# Patient Record
Sex: Female | Born: 1945 | Race: White | Hispanic: No | Marital: Married | Smoking: Former smoker
Health system: Southern US, Community
[De-identification: ages and names within clinical notes are randomized; demographics above are authoritative.]

## PROBLEM LIST (undated history)

## (undated) DIAGNOSIS — M1990 Unspecified osteoarthritis, unspecified site: Secondary | ICD-10-CM

## (undated) DIAGNOSIS — K529 Noninfective gastroenteritis and colitis, unspecified: Secondary | ICD-10-CM

## (undated) DIAGNOSIS — J310 Chronic rhinitis: Secondary | ICD-10-CM

## (undated) DIAGNOSIS — K219 Gastro-esophageal reflux disease without esophagitis: Secondary | ICD-10-CM

## (undated) DIAGNOSIS — J189 Pneumonia, unspecified organism: Secondary | ICD-10-CM

## (undated) DIAGNOSIS — I10 Essential (primary) hypertension: Secondary | ICD-10-CM

## (undated) DIAGNOSIS — K589 Irritable bowel syndrome without diarrhea: Secondary | ICD-10-CM

## (undated) DIAGNOSIS — R002 Palpitations: Secondary | ICD-10-CM

## (undated) HISTORY — DX: Unspecified osteoarthritis, unspecified site: M19.90

## (undated) HISTORY — DX: Essential (primary) hypertension: I10

## (undated) HISTORY — PX: COLONOSCOPY: SHX174

## (undated) HISTORY — PX: DILATION AND CURETTAGE OF UTERUS: SHX78

## (undated) HISTORY — DX: Palpitations: R00.2

## (undated) HISTORY — PX: TONSILLECTOMY: SUR1361

## (undated) HISTORY — DX: Gastro-esophageal reflux disease without esophagitis: K21.9

## (undated) HISTORY — PX: BUNIONECTOMY: SHX129

## (undated) HISTORY — DX: Irritable bowel syndrome, unspecified: K58.9

## (undated) HISTORY — DX: Chronic rhinitis: J31.0

## (undated) HISTORY — PX: KNEE SURGERY: SHX244

## (undated) HISTORY — PX: TUBAL LIGATION: SHX77

---

## 2000-02-22 ENCOUNTER — Other Ambulatory Visit: Payer: Self-pay | Admitting: Internal Medicine

## 2001-04-22 DIAGNOSIS — K529 Noninfective gastroenteritis and colitis, unspecified: Secondary | ICD-10-CM

## 2001-04-22 HISTORY — DX: Noninfective gastroenteritis and colitis, unspecified: K52.9

## 2001-12-10 ENCOUNTER — Other Ambulatory Visit: Admission: RE | Admit: 2001-12-10 | Discharge: 2001-12-10 | Payer: Self-pay | Admitting: Family Medicine

## 2001-12-14 ENCOUNTER — Encounter: Admission: RE | Admit: 2001-12-14 | Discharge: 2001-12-14 | Payer: Self-pay | Admitting: Family Medicine

## 2001-12-14 ENCOUNTER — Encounter: Payer: Self-pay | Admitting: Family Medicine

## 2002-02-03 ENCOUNTER — Emergency Department (HOSPITAL_COMMUNITY): Admission: EM | Admit: 2002-02-03 | Discharge: 2002-02-03 | Payer: Self-pay | Admitting: Emergency Medicine

## 2002-02-11 ENCOUNTER — Encounter: Payer: Self-pay | Admitting: Family Medicine

## 2002-02-11 ENCOUNTER — Encounter: Admission: RE | Admit: 2002-02-11 | Discharge: 2002-02-11 | Payer: Self-pay | Admitting: Family Medicine

## 2002-02-22 ENCOUNTER — Ambulatory Visit (HOSPITAL_COMMUNITY): Admission: RE | Admit: 2002-02-22 | Discharge: 2002-02-22 | Payer: Self-pay | Admitting: Internal Medicine

## 2002-02-22 ENCOUNTER — Encounter (INDEPENDENT_AMBULATORY_CARE_PROVIDER_SITE_OTHER): Payer: Self-pay | Admitting: *Deleted

## 2002-04-23 ENCOUNTER — Encounter: Payer: Self-pay | Admitting: Internal Medicine

## 2002-04-23 ENCOUNTER — Encounter: Admission: RE | Admit: 2002-04-23 | Discharge: 2002-04-23 | Payer: Self-pay | Admitting: Internal Medicine

## 2003-11-29 ENCOUNTER — Encounter: Admission: RE | Admit: 2003-11-29 | Discharge: 2003-11-29 | Payer: Self-pay | Admitting: Family Medicine

## 2003-12-13 ENCOUNTER — Other Ambulatory Visit: Admission: RE | Admit: 2003-12-13 | Discharge: 2003-12-13 | Payer: Self-pay | Admitting: Obstetrics and Gynecology

## 2004-06-29 ENCOUNTER — Ambulatory Visit: Payer: Self-pay | Admitting: Internal Medicine

## 2004-07-03 ENCOUNTER — Ambulatory Visit: Payer: Self-pay | Admitting: Internal Medicine

## 2004-07-05 ENCOUNTER — Encounter: Admission: RE | Admit: 2004-07-05 | Discharge: 2004-07-05 | Payer: Self-pay | Admitting: Internal Medicine

## 2004-07-13 ENCOUNTER — Ambulatory Visit: Payer: Self-pay | Admitting: Internal Medicine

## 2005-10-21 ENCOUNTER — Encounter: Admission: RE | Admit: 2005-10-21 | Discharge: 2005-10-21 | Payer: Self-pay | Admitting: Internal Medicine

## 2005-12-17 ENCOUNTER — Encounter: Admission: RE | Admit: 2005-12-17 | Discharge: 2005-12-17 | Payer: Self-pay | Admitting: Internal Medicine

## 2007-01-14 ENCOUNTER — Encounter: Admission: RE | Admit: 2007-01-14 | Discharge: 2007-01-14 | Payer: Self-pay | Admitting: Family Medicine

## 2009-05-18 ENCOUNTER — Emergency Department (HOSPITAL_COMMUNITY): Admission: EM | Admit: 2009-05-18 | Discharge: 2009-05-18 | Payer: Self-pay | Admitting: Emergency Medicine

## 2011-03-12 ENCOUNTER — Emergency Department (HOSPITAL_COMMUNITY): Payer: Medicare Other

## 2011-03-12 ENCOUNTER — Emergency Department (HOSPITAL_COMMUNITY)
Admission: EM | Admit: 2011-03-12 | Discharge: 2011-03-12 | Disposition: A | Discharge status: Home / Self Care | Payer: Medicare Other | Attending: Emergency Medicine | Admitting: Emergency Medicine

## 2011-03-12 ENCOUNTER — Encounter: Payer: Self-pay | Admitting: Emergency Medicine

## 2011-03-12 DIAGNOSIS — F17200 Nicotine dependence, unspecified, uncomplicated: Secondary | ICD-10-CM | POA: Insufficient documentation

## 2011-03-12 DIAGNOSIS — I491 Atrial premature depolarization: Secondary | ICD-10-CM | POA: Insufficient documentation

## 2011-03-12 DIAGNOSIS — R002 Palpitations: Secondary | ICD-10-CM

## 2011-03-12 LAB — DIFFERENTIAL
Basophils Absolute: 0.1 10*3/uL (ref 0.0–0.1)
Basophils Relative: 1 % (ref 0–1)
Eosinophils Absolute: 0.1 10*3/uL (ref 0.0–0.7)
Eosinophils Relative: 1 % (ref 0–5)
Lymphs Abs: 2.5 10*3/uL (ref 0.7–4.0)
Monocytes Absolute: 0.9 10*3/uL (ref 0.1–1.0)
Monocytes Relative: 9 % (ref 3–12)
Neutro Abs: 7.1 10*3/uL (ref 1.7–7.7)

## 2011-03-12 LAB — CBC
Hemoglobin: 13.1 g/dL (ref 12.0–15.0)
MCH: 32.8 pg (ref 26.0–34.0)
MCHC: 33.8 g/dL (ref 30.0–36.0)
RBC: 3.99 MIL/uL (ref 3.87–5.11)

## 2011-03-12 LAB — BASIC METABOLIC PANEL
CO2: 25 mEq/L (ref 19–32)
Calcium: 9.6 mg/dL (ref 8.4–10.5)
Chloride: 103 mEq/L (ref 96–112)
Creatinine, Ser: 0.46 mg/dL — ABNORMAL LOW (ref 0.50–1.10)
Sodium: 140 mEq/L (ref 135–145)

## 2011-03-12 NOTE — ED Notes (Signed)
Pt BIBEMS alert, oriented x3 and verbally responsive, pt presents with c/o palpitation, per EMS pt was A-Fib on monitor, , pt denies CP, SOB, N/V, numbness or tingling sensation, pt states she feels better now

## 2011-03-12 NOTE — ED Provider Notes (Signed)
History     CSN: [id number] Arrival date & time: 03/12/2011 11:04 AM   Chief Complaint  Patient presents with  . Palpitations    onset started at 0800    HPI Pt was seen at 1120.  Per pt, c/o gradual onset and resolution of one episode of palpitations that began this morning PTA.  Pt describes the palpitations as her heart beating "fast" and "irregular."  EMS noted monitor with NSR and frequent PAC's.  Denies CP, no SOB, no back pain, no abd pain, no N/V/D, no fevers, no syncope, no lightheadedness.    Cards MD:  [facility] History reviewed. No pertinent past medical history.  History reviewed. No pertinent past surgical history.  Family History  Problem Relation Age of Onset  . Hypertension Father   . Cancer Sister     History  Substance Use Topics  . Smoking status: Current Everyday Smoker -- 20 years  . Smokeless tobacco: Not on file  . Alcohol Use: Yes     socially    Review of Systems ROS: Statement: All systems negative except as marked or noted in the HPI; Constitutional: Negative for fever and chills. ; ; Eyes: Negative for eye pain, redness and discharge. ; ; ENMT: Negative for ear pain, hoarseness, nasal congestion, sinus pressure and sore throat. ; ; Cardiovascular: +palpitations.  Negative for chest pain, diaphoresis, dyspnea and peripheral edema. ; ; Respiratory: Negative for cough, wheezing and stridor. ; ; Gastrointestinal: Negative for nausea, vomiting, diarrhea and abdominal pain, blood in stool, hematemesis, jaundice and rectal bleeding. . ; ; Genitourinary: Negative for dysuria, flank pain and hematuria. ; ; Musculoskeletal: Negative for back pain and neck pain. Negative for swelling and trauma.; ; Skin: Negative for pruritus, rash, abrasions, blisters, bruising and skin lesion.; ; Neuro: Negative for headache, lightheadedness and neck stiffness. Negative for weakness, altered level of consciousness , altered mental status, extremity weakness,  paresthesias, involuntary movement, seizure and syncope.     Allergies  Review of patient's allergies indicates no known allergies.  Home Medications   Current Outpatient Rx  Name Route Sig Dispense Refill  . VITAMIN C PO Oral Take 1 tablet by mouth daily.      Marland Kitchen CALCIUM-VITAMIN D PO Oral Take 1 tablet by mouth daily.      Marland Kitchen HYPROMELLOSE 2.5 % OP SOLN Both Eyes Place 1 drop into both eyes 2 (two) times daily.      Marland Kitchen VITAMIN D (CHOLECALCIFEROL) PO Oral Take 1 tablet by mouth daily.      Marland Kitchen VITAMIN E PO Oral Take 1 capsule by mouth daily.        BP 129/73  Pulse 68  Temp(Src) 98.6 F (37 C) (Oral)  Resp 20  Ht 5\' 6"  (1.676 m)  Wt 100 lb (45.36 kg)  BMI 16.14 kg/m2  SpO2 97%  Physical Exam 1125: Physical examination:  Nursing notes reviewed; Vital signs and O2 SAT reviewed;  Constitutional: Well developed, Well nourished, Well hydrated, In no acute distress; Head:  Normocephalic, atraumatic; Eyes: EOMI, PERRL, No scleral icterus; ENMT: Mouth and pharynx normal, Mucous membranes moist; Neck: Supple, Full range of motion, No lymphadenopathy; Cardiovascular: Regular rate and rhythm, No murmur, rub, or gallop; Respiratory: Breath sounds clear & equal bilaterally, No rales, rhonchi, wheezes, or rub, Normal respiratory effort/excursion; Chest: Nontender, Movement normal; Abdomen: Soft, Nontender, Nondistended, Normal bowel sounds; Extremities: Pulses normal, No tenderness, No edema, No calf edema or asymmetry.; Neuro: AA&Ox3, Major CN grossly intact.  No gross  focal motor or sensory deficits in extremities.; Skin: Color normal, Warm, Dry, no rash.    ED Course  Procedures    MDM  MDM Reviewed: nursing note and vitals Interpretation: ECG, labs and x-ray    Date: 03/12/2011  Rate: 82  Rhythm: normal sinus rhythm  QRS Axis: normal  Intervals: normal  ST/T Wave abnormalities: normal  Conduction Disutrbances:none  Narrative Interpretation: RVH  Old EKG Reviewed: none available.     Results for orders placed during the hospital encounter of 03/12/11  D-DIMER, QUANTITATIVE      Component Value Range   D-Dimer, Quant 0.41  0.00 - 0.48 (ug/mL-FEU)  BASIC METABOLIC PANEL      Component Value Range   Sodium 140  135 - 145 (mEq/L)   Potassium 3.5  3.5 - 5.1 (mEq/L)   Chloride 103  96 - 112 (mEq/L)   CO2 25  19 - 32 (mEq/L)   Glucose, Bld 105 (*) 70 - 99 (mg/dL)   BUN 13  6 - 23 (mg/dL)   Creatinine, Ser 1.61 (*) 0.50 - 1.10 (mg/dL)   Calcium 9.6  8.4 - 09.6 (mg/dL)   GFR calc non Af Amer >90  >90 (mL/min)   GFR calc Af Amer >90  >90 (mL/min)  CBC      Component Value Range   WBC 10.6 (*) 4.0 - 10.5 (K/uL)   RBC 3.99  3.87 - 5.11 (MIL/uL)   Hemoglobin 13.1  12.0 - 15.0 (g/dL)   HCT 04.5  40.9 - 81.1 (%)   MCV 97.2  78.0 - 100.0 (fL)   MCH 32.8  26.0 - 34.0 (pg)   MCHC 33.8  30.0 - 36.0 (g/dL)   RDW 91.4  78.2 - 95.6 (%)   Platelets 299  150 - 400 (K/uL)  DIFFERENTIAL      Component Value Range   Neutrophils Relative 66  43 - 77 (%)   Neutro Abs 7.1  1.7 - 7.7 (K/uL)   Lymphocytes Relative 23  12 - 46 (%)   Lymphs Abs 2.5  0.7 - 4.0 (K/uL)   Monocytes Relative 9  3 - 12 (%)   Monocytes Absolute 0.9  0.1 - 1.0 (K/uL)   Eosinophils Relative 1  0 - 5 (%)   Eosinophils Absolute 0.1  0.0 - 0.7 (K/uL)   Basophils Relative 1  0 - 1 (%)   Basophils Absolute 0.1  0.0 - 0.1 (K/uL)  POCT I-STAT TROPONIN I      Component Value Range   Troponin i, poc 0.00  0.00 - 0.08 (ng/mL)   Comment 3            Dg Chest 2 View  03/12/2011  *RADIOLOGY REPORT*  Clinical Data: Tachycardia.  CHEST - 2 VIEW  Comparison: None.  Findings: Marked pulmonary hyperinflation is seen, consistent with COPD.  No evidence of pulmonary infiltrate or edema.  No evidence of pleural effusion.  Heart size is normal.  No mass or lymphadenopathy identified.  IMPRESSION: COPD.  No active disease.  Original Report Authenticated By: Danae Orleans, M.D.    2:58 PM:  Pt monitor with NSR, occas PAC's.   Continues to deny CP or SOB.  Denies palpitations since arrival to ED.  Wants to go home now and states she has a Cards MD she can f/u with.  Dx testing d/w pt and family.  Questions answered.  Verb understanding, agreeable to d/c home with outpt f/u.      [facility] M  JAELYN BOURGOIN, DO 03/12/11 2126

## 2011-03-13 ENCOUNTER — Ambulatory Visit: Payer: Medicare Other | Admitting: Internal Medicine

## 2011-03-28 ENCOUNTER — Encounter: Payer: Self-pay | Admitting: Cardiovascular Disease

## 2011-04-02 ENCOUNTER — Ambulatory Visit (INDEPENDENT_AMBULATORY_CARE_PROVIDER_SITE_OTHER): Payer: Medicare Other | Admitting: Cardiovascular Disease

## 2011-04-02 ENCOUNTER — Encounter: Payer: Self-pay | Admitting: Cardiovascular Disease

## 2011-04-02 VITALS — BP 145/81 | HR 72 | Ht 66.0 in | Wt 105.1 lb

## 2011-04-02 DIAGNOSIS — R002 Palpitations: Secondary | ICD-10-CM | POA: Insufficient documentation

## 2011-04-02 MED ORDER — PROPRANOLOL HCL 10 MG PO TABS: 10.0000 mg | ORAL_TABLET | Freq: Four times a day (QID) | ORAL | Status: DC | PRN

## 2011-04-02 NOTE — Progress Notes (Signed)
    Kristina Dennis Date of Birth  09/14/1945 [facility] [address]    [address] [place], [place]  [address] [phone]  Fax  [phone]  History of Present Illness:  Kristina Dennis is a 65 year old female who presents today for further evaluation of palpitations.  She's had 2 episodes of palpitations. She had an episode on November 20 for which she was evaluated in the emergency room. EKG there revealed normal sinus rhythm with premature atrial contractions.   These palpitations are not associated with syncope or presyncope, chest pain, or shortness of breath. She denies any PND orthopnea.   She exercises on an intermittent basis. She's able to do all of her normal activities without any significant problems.  She used to run marathons. She has not run since she developed arthritis.    Current Outpatient Prescriptions on File Prior to Visit  Medication Sig Dispense Refill  . Ascorbic Acid (VITAMIN C PO) Take 1 tablet by mouth daily.        Marland Kitchen CALCIUM-VITAMIN D PO Take 1 tablet by mouth daily.        . hydroxypropyl methylcellulose (ISOPTO TEARS) 2.5 % ophthalmic solution Place 1 drop into both eyes 2 (two) times daily.        Marland Kitchen VITAMIN D, CHOLECALCIFEROL, PO Take 1 tablet by mouth daily.        Marland Kitchen VITAMIN E PO Take 1 capsule by mouth daily.          Allergies  Allergen Reactions  . Niacin And Related     Past Medical History  Diagnosis Date  . Arthritis   . Palpitations     Past Surgical History  Procedure Date  . Knee surgery   . Tubal ligation     History  Smoking status  . Current Some Day Smoker -- 20 years  Smokeless tobacco  . Not on file    History  Alcohol Use  . Yes    socially    Family History  Problem Relation Age of Onset  . Hypertension Father   . Cancer Sister   . Arrhythmia Mother     Reviw of Systems:  Reviewed in the HPI.  All other systems are negative.  Physical Exam: BP 145/81  Pulse 72  Ht 5\' 6"  (1.676 m)  Wt 105 lb  1.9 oz (47.682 kg)  BMI 16.97 kg/m2 The patient is alert and oriented x 3.  The mood and affect are normal.   Skin: warm and dry.  Color is normal.    HEENT:   Normocephalic/atraumatic.  Mucous membranes are moist. Neck is supple.  Lungs: Lungs are clear to auscultation  Heart: Regular rate S1-S2.    Abdomen: Abdomen is benign. His good bowel sounds.  Extremities:  Extremities she has no clubbing cyanosis or edema  Neuro:  Cranial nerves II through XII are intact. She has good motor and sensory function appear    ECG: Normal sinus rhythm  Assessment / Plan:

## 2011-04-02 NOTE — Assessment & Plan Note (Signed)
Kristina Dennis presents with a recent onset of palpitations. She was seen in the emergency room and was found to have premature atrial contractions.  At this point I think that her palpitations are entirely benign. She could be having premature atrial contractions versus premature ventricular contractions. We discussed the possible etiologies for this including nicotine, excessive caffeine, fatigue, lack of sleep, stress. At this point I don't think that she's having any significant arrhythmias that would require an echocardiogram or monitor. She does not think that she has these episodes often of to warrant wearing a monitor. I'll see her again in one year for followup visit.

## 2011-04-02 NOTE — Patient Instructions (Signed)
Your physician wants you to follow-up in: 1 year  You will receive a reminder letter in the mail two months in advance. If you don't receive a letter, please call our office to schedule the follow-up appointment.  Your physician has recommended you make the following change in your medication:   Start propranolol for palpitations one tablet up to 4 doses 30 minutes apart.

## 2011-04-11 ENCOUNTER — Other Ambulatory Visit: Payer: Self-pay | Admitting: Internal Medicine

## 2011-05-03 ENCOUNTER — Ambulatory Visit (AMBULATORY_SURGERY_CENTER): Payer: Medicare Other

## 2011-05-03 ENCOUNTER — Encounter: Payer: Self-pay | Admitting: Internal Medicine

## 2011-05-03 VITALS — Ht 66.0 in | Wt 105.0 lb

## 2011-05-03 DIAGNOSIS — Z1211 Encounter for screening for malignant neoplasm of colon: Secondary | ICD-10-CM

## 2011-05-03 MED ORDER — PEG-KCL-NACL-NASULF-NA ASC-C 100 G PO SOLR: 1.0000 | Freq: Once | ORAL | Status: DC

## 2011-05-17 ENCOUNTER — Ambulatory Visit (AMBULATORY_SURGERY_CENTER): Payer: Medicare Other | Admitting: Internal Medicine

## 2011-05-17 ENCOUNTER — Encounter: Payer: Self-pay | Admitting: Internal Medicine

## 2011-05-17 VITALS — BP 140/84 | HR 63 | Temp 98.3°F | Resp 22 | Ht 66.0 in | Wt 105.0 lb

## 2011-05-17 DIAGNOSIS — Z1211 Encounter for screening for malignant neoplasm of colon: Secondary | ICD-10-CM

## 2011-05-17 DIAGNOSIS — Z800 Family history of malignant neoplasm of digestive organs: Secondary | ICD-10-CM

## 2011-05-17 MED ORDER — SODIUM CHLORIDE 0.9 % IV SOLN: 500.0000 mL | INTRAVENOUS | Status: DC

## 2011-05-17 NOTE — Patient Instructions (Addendum)
The colon was normal. No polyps or cancer seen. You should have a routine repeat colonoscopy in about 5 years. Iva Boop, MD, [facility] Please refer to blue and green discharge instruction sheets.

## 2011-05-17 NOTE — Progress Notes (Signed)
Patient did not experience any of the following events: a burn prior to discharge; a fall within the facility; wrong site/side/patient/procedure/implant event; or a hospital transfer or hospital admission upon discharge from the facility. (G8907) Patient did not have preoperative order for IV antibiotic SSI prophylaxis. (G8918)  

## 2011-05-17 NOTE — Op Note (Signed)
[facility] [address] [place], [place]  [address]  COLONOSCOPY PROCEDURE REPORT  PATIENT:  Kristina, Dennis  MR#:  [id number] BIRTHDATE:  07/08/1945, 65 yrs. old  GENDER:  female ENDOSCOPIST:  Iva Boop, MD, [facility]  PROCEDURE DATE:  05/17/2011 PROCEDURE:  Colonoscopy 84[address] ASA CLASS:  Class I INDICATIONS:  Elevated Risk Screening, family history of colon cancer sister with rectal cancer at 18 MEDICATIONS:   These medications were titrated to patient response per physician's verbal order, Fentanyl 75 mcg IV, Versed 9 mg IV  DESCRIPTION OF PROCEDURE:   After the risks benefits and alternatives of the procedure were thoroughly explained, informed consent was obtained.  Digital rectal exam was performed and revealed no abnormalities.   The LB 180AL [id number] endoscope was introduced through the anus and advanced to the cecum, which was identified by both the appendix and ileocecal valve, without limitations.  The quality of the prep was excellent, using MoviPrep.  The instrument was then slowly withdrawn as the colon was fully examined. <<PROCEDUREIMAGES>>  FINDINGS:  A normal appearing cecum, ileocecal valve, and appendiceal orifice were identified. The ascending, hepatic flexure, transverse, splenic flexure, descending, sigmoid colon, and rectum appeared unremarkable.   Retroflexed views in the rectum revealed no abnormalities.    The time to cecum = 5:33 minutes. The scope was then withdrawn in 12:25 minutes from the cecum and the procedure completed. COMPLICATIONS:  None ENDOSCOPIC IMPRESSION: 1) Normal colonoscopy, excellent prep 2) Family history of colorectal cancer - sister  REPEAT EXAM:  In 5 year(s) for routine screening colonoscopy.  Iva Boop, MD, [facility]  CC:  The Patient [facility]  n. eSIGNED:   Iva Boop at 05/17/2011 02:10 PM  Rolena Infante, [phone]

## 2011-05-20 ENCOUNTER — Telehealth: Payer: Self-pay | Admitting: *Deleted

## 2011-05-20 NOTE — Telephone Encounter (Signed)
  Follow up Call-  Call back number 05/17/2011  Post procedure Call Back phone  # [phone]  L/M     Patient questions:  Do you have a fever, pain , or abdominal swelling? no Pain Score  0 *  Have you tolerated food without any problems? yes  Have you been able to return to your normal activities? yes  Do you have any questions about your discharge instructions: Diet   no Medications  no Follow up visit  no  Do you have questions or concerns about your Care? no  Actions: * If pain score is 4 or above: No action needed, pain <4.

## 2013-12-02 ENCOUNTER — Other Ambulatory Visit (HOSPITAL_COMMUNITY): Payer: Self-pay | Admitting: Orthopedic Surgery

## 2013-12-07 ENCOUNTER — Encounter (HOSPITAL_COMMUNITY): Payer: Self-pay | Admitting: Pharmacy Technician

## 2013-12-07 NOTE — Pre-Procedure Instructions (Addendum)
Johnney OuKathleen M Pakula  12/07/2013   Your procedure is scheduled on:  12/09/13  [facility] short stay admitting at 1100 AM.  Call this number if you have problems the morning of surgery: [phone]   Remember:   Do not eat food or drink liquids after midnight.   Take these medicines the morning of surgery with A SIP OF WATER: none          STOP all herbel meds, nsaids (aleve,naproxen,advil,ibuprofen) including vitamins,aspirin,fish oil , all supplements now   Do not wear jewelry, make-up or nail polish.  Do not wear lotions, powders, or perfumes. You may wear deodorant.  Do not shave 48 hours prior to surgery. Men may shave face and neck.  Do not bring valuables to the hospital.  [facility] is not responsible                  for any belongings or valuables.               Contacts, dentures or bridgework may not be worn into surgery.  Leave suitcase in the car. After surgery it may be brought to your room.  For patients admitted to the hospital, discharge time is determined by your                treatment team.               Patients discharged the day of surgery will not be allowed to drive  home.  Name and phone number of your driver:   Special Instructions:  Special Instructions: [place] - Preparing for Surgery  Before surgery, you can play an important role.  Because skin is not sterile, your skin needs to be as free of germs as possible.  You can reduce the number of germs on you skin by washing with CHG (chlorahexidine gluconate) soap before surgery.  CHG is an antiseptic cleaner which kills germs and bonds with the skin to continue killing germs even after washing.  Please DO NOT use if you have an allergy to CHG or antibacterial soaps.  If your skin becomes reddened/irritated stop using the CHG and inform your nurse when you arrive at Short Stay.  Do not shave (including legs and underarms) for at least 48 hours prior to the first CHG shower.  You may shave your  face.  Please follow these instructions carefully:   1.  Shower with CHG Soap the night before surgery and the morning of Surgery.  2.  If you choose to wash your hair, wash your hair first as usual with your normal shampoo.  3.  After you shampoo, rinse your hair and body thoroughly to remove the Shampoo.  4.  Use CHG as you would any other liquid soap.  You can apply chg directly  to the skin and wash gently with scrungie or a clean washcloth.  5.  Apply the CHG Soap to your body ONLY FROM THE NECK DOWN.  Do not use on open wounds or open sores.  Avoid contact with your eyes ears, mouth and genitals (private parts).  Wash genitals (private parts)       with your normal soap.  6.  Wash thoroughly, paying special attention to the area where your surgery will be performed.  7.  Thoroughly rinse your body with warm water from the neck down.  8.  DO NOT shower/wash with your normal soap after using and rinsing off the CHG Soap.  9.  Pat yourself dry with a clean towel.            10.  Wear clean pajamas.            11.  Place clean sheets on your bed the night of your first shower and do not sleep with pets.  Day of Surgery  Do not apply any lotions/deodorants the morning of surgery.  Please wear clean clothes to the hospital/surgery center.   Please read over the following fact sheets that you were given: Pain Booklet, Coughing and Deep Breathing and Surgical Site Infection Prevention

## 2013-12-08 ENCOUNTER — Encounter (HOSPITAL_COMMUNITY)
Admission: RE | Admit: 2013-12-08 | Discharge: 2013-12-08 | Disposition: A | Discharge status: Home / Self Care | Payer: Medicare Other | Source: Ambulatory Visit | Attending: Orthopedic Surgery | Admitting: Orthopedic Surgery

## 2013-12-08 ENCOUNTER — Encounter (HOSPITAL_COMMUNITY): Payer: Self-pay

## 2013-12-08 DIAGNOSIS — X58XXXA Exposure to other specified factors, initial encounter: Secondary | ICD-10-CM | POA: Insufficient documentation

## 2013-12-08 DIAGNOSIS — S52599A Other fractures of lower end of unspecified radius, initial encounter for closed fracture: Secondary | ICD-10-CM | POA: Insufficient documentation

## 2013-12-08 DIAGNOSIS — Z01818 Encounter for other preprocedural examination: Secondary | ICD-10-CM | POA: Insufficient documentation

## 2013-12-08 HISTORY — DX: Pneumonia, unspecified organism: J18.9

## 2013-12-08 LAB — DIFFERENTIAL
Basophils Absolute: 0.1 10*3/uL (ref 0.0–0.1)
Basophils Relative: 1 % (ref 0–1)
Eosinophils Absolute: 0.2 10*3/uL (ref 0.0–0.7)
Eosinophils Relative: 2 % (ref 0–5)
LYMPHS ABS: 1.6 10*3/uL (ref 0.7–4.0)
Lymphocytes Relative: 17 % (ref 12–46)
Monocytes Absolute: 1.8 10*3/uL — ABNORMAL HIGH (ref 0.1–1.0)
Monocytes Relative: 19 % — ABNORMAL HIGH (ref 3–12)
NEUTROS ABS: 5.4 10*3/uL (ref 1.7–7.7)
NEUTROS PCT: 61 % (ref 43–77)

## 2013-12-08 LAB — BASIC METABOLIC PANEL
Anion gap: 17 — ABNORMAL HIGH (ref 5–15)
BUN: 7 mg/dL (ref 6–23)
CHLORIDE: 96 meq/L (ref 96–112)
CO2: 22 mEq/L (ref 19–32)
Calcium: 9.8 mg/dL (ref 8.4–10.5)
Creatinine, Ser: 0.43 mg/dL — ABNORMAL LOW (ref 0.50–1.10)
Glucose, Bld: 108 mg/dL — ABNORMAL HIGH (ref 70–99)
POTASSIUM: 4.3 meq/L (ref 3.7–5.3)
Sodium: 135 mEq/L — ABNORMAL LOW (ref 137–147)

## 2013-12-08 LAB — CBC
HCT: 40.7 % (ref 36.0–46.0)
Hemoglobin: 13.9 g/dL (ref 12.0–15.0)
MCH: 34.1 pg — ABNORMAL HIGH (ref 26.0–34.0)
MCHC: 34.2 g/dL (ref 30.0–36.0)
MCV: 99.8 fL (ref 78.0–100.0)
PLATELETS: 266 10*3/uL (ref 150–400)
RBC: 4.08 MIL/uL (ref 3.87–5.11)
RDW: 13.1 % (ref 11.5–15.5)
WBC: 9 10*3/uL (ref 4.0–10.5)

## 2013-12-08 NOTE — Progress Notes (Addendum)
Patient states has "bites" on rt leg. Groin, and waist . One of them was "oozing" yesterday. Groin area has "red streak". Dr deans office called . Spoke with wendy   She will check with dr dean. Patient states she is going by her pcp on way home.

## 2013-12-13 ENCOUNTER — Ambulatory Visit (HOSPITAL_COMMUNITY)
Admission: RE | Admit: 2013-12-13 | Discharge: 2013-12-13 | Disposition: A | Discharge status: Home / Self Care | Payer: Medicare Other | Source: Ambulatory Visit | Attending: Orthopedic Surgery | Admitting: Orthopedic Surgery

## 2013-12-13 ENCOUNTER — Ambulatory Visit (HOSPITAL_COMMUNITY): Payer: Medicare Other | Admitting: Certified Registered Nurse Anesthetist

## 2013-12-13 ENCOUNTER — Encounter (HOSPITAL_COMMUNITY): Payer: Medicare Other | Admitting: Certified Registered Nurse Anesthetist

## 2013-12-13 ENCOUNTER — Encounter (HOSPITAL_COMMUNITY): Payer: Self-pay | Admitting: *Deleted

## 2013-12-13 ENCOUNTER — Encounter (HOSPITAL_COMMUNITY)
Admission: RE | Disposition: A | Discharge status: Home / Self Care | Payer: Self-pay | Source: Ambulatory Visit | Attending: Orthopedic Surgery

## 2013-12-13 DIAGNOSIS — M21839 Other specified acquired deformities of unspecified forearm: Secondary | ICD-10-CM | POA: Insufficient documentation

## 2013-12-13 DIAGNOSIS — Z882 Allergy status to sulfonamides status: Secondary | ICD-10-CM | POA: Insufficient documentation

## 2013-12-13 DIAGNOSIS — Z8701 Personal history of pneumonia (recurrent): Secondary | ICD-10-CM | POA: Diagnosis not present

## 2013-12-13 DIAGNOSIS — M25539 Pain in unspecified wrist: Secondary | ICD-10-CM | POA: Insufficient documentation

## 2013-12-13 DIAGNOSIS — Z885 Allergy status to narcotic agent status: Secondary | ICD-10-CM | POA: Diagnosis not present

## 2013-12-13 DIAGNOSIS — Z7982 Long term (current) use of aspirin: Secondary | ICD-10-CM | POA: Diagnosis not present

## 2013-12-13 DIAGNOSIS — F17200 Nicotine dependence, unspecified, uncomplicated: Secondary | ICD-10-CM | POA: Insufficient documentation

## 2013-12-13 DIAGNOSIS — Z79899 Other long term (current) drug therapy: Secondary | ICD-10-CM | POA: Diagnosis not present

## 2013-12-13 DIAGNOSIS — S62102A Fracture of unspecified carpal bone, left wrist, initial encounter for closed fracture: Secondary | ICD-10-CM

## 2013-12-13 HISTORY — PX: ORIF WRIST FRACTURE: SHX2133

## 2013-12-13 SURGERY — OPEN REDUCTION INTERNAL FIXATION (ORIF) WRIST FRACTURE
Anesthesia: General | Site: Wrist | Laterality: Left

## 2013-12-13 MED ORDER — MIDAZOLAM HCL 5 MG/5ML IJ SOLN
INTRAMUSCULAR | Status: DC | PRN
  Administered 2013-12-13: 2 mg via INTRAVENOUS

## 2013-12-13 MED ORDER — CEFAZOLIN SODIUM-DEXTROSE 2-3 GM-% IV SOLR
INTRAVENOUS | Status: AC
  Filled 2013-12-13: qty 50

## 2013-12-13 MED ORDER — EPHEDRINE SULFATE 50 MG/ML IJ SOLN
INTRAMUSCULAR | Status: DC | PRN
  Administered 2013-12-13: 10 mg via INTRAVENOUS
  Administered 2013-12-13: 5 mg via INTRAVENOUS
  Administered 2013-12-13: 10 mg via INTRAVENOUS
  Administered 2013-12-13: 5 mg via INTRAVENOUS
  Administered 2013-12-13: 10 mg via INTRAVENOUS
  Administered 2013-12-13 (×2): 5 mg via INTRAVENOUS

## 2013-12-13 MED ORDER — BUPIVACAINE HCL (PF) 0.25 % IJ SOLN
INTRAMUSCULAR | Status: AC
  Filled 2013-12-13: qty 30

## 2013-12-13 MED ORDER — PROPOFOL 10 MG/ML IV BOLUS
INTRAVENOUS | Status: DC | PRN
  Administered 2013-12-13: 160 mg via INTRAVENOUS

## 2013-12-13 MED ORDER — LACTATED RINGERS IV SOLN
INTRAVENOUS | Status: DC | PRN
  Administered 2013-12-13 (×2): via INTRAVENOUS

## 2013-12-13 MED ORDER — PROMETHAZINE HCL 25 MG/ML IJ SOLN: 6.2500 mg | INTRAMUSCULAR | Status: DC | PRN

## 2013-12-13 MED ORDER — PHENYLEPHRINE 40 MCG/ML (10ML) SYRINGE FOR IV PUSH (FOR BLOOD PRESSURE SUPPORT)
PREFILLED_SYRINGE | INTRAVENOUS | Status: AC
  Filled 2013-12-13: qty 20

## 2013-12-13 MED ORDER — PROPOFOL 10 MG/ML IV BOLUS
INTRAVENOUS | Status: AC
  Filled 2013-12-13: qty 20

## 2013-12-13 MED ORDER — PHENYLEPHRINE HCL 10 MG/ML IJ SOLN
INTRAMUSCULAR | Status: DC | PRN
  Administered 2013-12-13 (×5): 80 ug via INTRAVENOUS

## 2013-12-13 MED ORDER — CEFAZOLIN SODIUM-DEXTROSE 2-3 GM-% IV SOLR
INTRAVENOUS | Status: DC | PRN
  Administered 2013-12-13: 2 g via INTRAVENOUS

## 2013-12-13 MED ORDER — ONDANSETRON HCL 4 MG/2ML IJ SOLN
INTRAMUSCULAR | Status: DC | PRN
  Administered 2013-12-13: 4 mg via INTRAVENOUS

## 2013-12-13 MED ORDER — OXYCODONE-ACETAMINOPHEN 5-325 MG PO TABS: 1.0000 | ORAL_TABLET | Freq: Four times a day (QID) | ORAL | Status: DC | PRN

## 2013-12-13 MED ORDER — 0.9 % SODIUM CHLORIDE (POUR BTL) OPTIME
TOPICAL | Status: DC | PRN
  Administered 2013-12-13 (×3): 1000 mL

## 2013-12-13 MED ORDER — ROCURONIUM BROMIDE 50 MG/5ML IV SOLN
INTRAVENOUS | Status: AC
  Filled 2013-12-13: qty 1

## 2013-12-13 MED ORDER — FENTANYL CITRATE 0.05 MG/ML IJ SOLN
INTRAMUSCULAR | Status: DC | PRN
  Administered 2013-12-13: 100 ug via INTRAVENOUS
  Administered 2013-12-13: 50 ug via INTRAVENOUS

## 2013-12-13 MED ORDER — ONDANSETRON HCL 4 MG PO TABS: 4.0000 mg | ORAL_TABLET | Freq: Three times a day (TID) | ORAL | Status: DC | PRN

## 2013-12-13 MED ORDER — EPHEDRINE SULFATE 50 MG/ML IJ SOLN
INTRAMUSCULAR | Status: AC
  Filled 2013-12-13: qty 1

## 2013-12-13 MED ORDER — PHENYLEPHRINE 40 MCG/ML (10ML) SYRINGE FOR IV PUSH (FOR BLOOD PRESSURE SUPPORT)
PREFILLED_SYRINGE | INTRAVENOUS | Status: AC
  Filled 2013-12-13: qty 10

## 2013-12-13 MED ORDER — MIDAZOLAM HCL 2 MG/2ML IJ SOLN
INTRAMUSCULAR | Status: AC
  Filled 2013-12-13: qty 2

## 2013-12-13 MED ORDER — LIDOCAINE HCL (CARDIAC) 20 MG/ML IV SOLN
INTRAVENOUS | Status: AC
  Filled 2013-12-13: qty 5

## 2013-12-13 MED ORDER — ONDANSETRON HCL 4 MG/2ML IJ SOLN
INTRAMUSCULAR | Status: AC
  Filled 2013-12-13: qty 2

## 2013-12-13 MED ORDER — HYDROMORPHONE HCL PF 1 MG/ML IJ SOLN: 0.2500 mg | INTRAMUSCULAR | Status: DC | PRN

## 2013-12-13 MED ORDER — SODIUM CHLORIDE 0.9 % IJ SOLN
INTRAMUSCULAR | Status: AC
  Filled 2013-12-13: qty 10

## 2013-12-13 MED ORDER — FENTANYL CITRATE 0.05 MG/ML IJ SOLN
INTRAMUSCULAR | Status: AC
  Filled 2013-12-13: qty 5

## 2013-12-13 SURGICAL SUPPLY — 94 items
BANDAGE ELASTIC 4 VELCRO ST LF (GAUZE/BANDAGES/DRESSINGS) ×3 IMPLANT
BIT DRILL 2.2 SS TIBIAL (BIT) ×2 IMPLANT
BLADE SURG 10 STRL SS (BLADE) ×3 IMPLANT
BNDG CMPR 9X4 STRL LF SNTH (GAUZE/BANDAGES/DRESSINGS) ×1
BNDG ESMARK 4X9 LF (GAUZE/BANDAGES/DRESSINGS) ×2 IMPLANT
BNDG GAUZE ELAST 4 BULKY (GAUZE/BANDAGES/DRESSINGS) ×2 IMPLANT
BONE MATRIX DEMINERALIZED 1CC (Bone Implant) ×2 IMPLANT
CLOSURE WOUND 1/2 X4 (GAUZE/BANDAGES/DRESSINGS)
CORDS BIPOLAR (ELECTRODE) ×3 IMPLANT
COVER SURGICAL LIGHT HANDLE (MISCELLANEOUS) ×3 IMPLANT
CUFF TOURN SGL LL 12 NO SLV (MISCELLANEOUS) ×2 IMPLANT
CUFF TOURNIQUET SINGLE 18IN (TOURNIQUET CUFF) ×1 IMPLANT
CUFF TOURNIQUET SINGLE 24IN (TOURNIQUET CUFF) IMPLANT
DRAIN TLS ROUND 10FR (DRAIN) IMPLANT
DRAPE INCISE IOBAN 66X45 STRL (DRAPES) ×2 IMPLANT
DRAPE OEC MINIVIEW 54X84 (DRAPES) ×2 IMPLANT
DRAPE U-SHAPE 47X51 STRL (DRAPES) ×3 IMPLANT
DRSG PAD ABDOMINAL 8X10 ST (GAUZE/BANDAGES/DRESSINGS) IMPLANT
DURAPREP 26ML APPLICATOR (WOUND CARE) ×3 IMPLANT
ELECT REM PT RETURN 9FT ADLT (ELECTROSURGICAL) ×3
ELECTRODE REM PT RTRN 9FT ADLT (ELECTROSURGICAL) ×1 IMPLANT
FACESHIELD WRAPAROUND (MASK) ×3 IMPLANT
FACESHIELD WRAPAROUND OR TEAM (MASK) ×1 IMPLANT
GAUZE SPONGE 4X4 12PLY STRL (GAUZE/BANDAGES/DRESSINGS) IMPLANT
GAUZE XEROFORM 1X8 LF (GAUZE/BANDAGES/DRESSINGS) ×2 IMPLANT
GLOVE BIO SURGEON ST LM GN SZ9 (GLOVE) ×1 IMPLANT
GLOVE BIO SURGEON STRL SZ7 (GLOVE) ×2 IMPLANT
GLOVE BIOGEL PI IND STRL 7.0 (GLOVE) IMPLANT
GLOVE BIOGEL PI IND STRL 7.5 (GLOVE) IMPLANT
GLOVE BIOGEL PI IND STRL 8 (GLOVE) ×1 IMPLANT
GLOVE BIOGEL PI INDICATOR 7.0 (GLOVE) ×10
GLOVE BIOGEL PI INDICATOR 7.5 (GLOVE) ×2
GLOVE BIOGEL PI INDICATOR 8 (GLOVE) ×2
GLOVE SS PI 9.0 STRL (GLOVE) ×2 IMPLANT
GLOVE SURG ORTHO 8.0 STRL STRW (GLOVE) ×1 IMPLANT
GLOVE SURG SS PI 7.0 STRL IVOR (GLOVE) ×8 IMPLANT
GLOVE SURG SS PI 7.5 STRL IVOR (GLOVE) ×2 IMPLANT
GOWN STRL REUS W/ TWL LRG LVL3 (GOWN DISPOSABLE) ×2 IMPLANT
GOWN STRL REUS W/ TWL XL LVL3 (GOWN DISPOSABLE) ×1 IMPLANT
GOWN STRL REUS W/TWL LRG LVL3 (GOWN DISPOSABLE) ×9
GOWN STRL REUS W/TWL XL LVL3 (GOWN DISPOSABLE) ×6
K-WIRE 1.6 (WIRE) ×6
K-WIRE FX5X1.6XNS BN SS (WIRE) ×2
KIT BASIN OR (CUSTOM PROCEDURE TRAY) ×3 IMPLANT
KIT ROOM TURNOVER OR (KITS) ×3 IMPLANT
KWIRE FX5X1.6XNS BN SS (WIRE) IMPLANT
MANIFOLD NEPTUNE II (INSTRUMENTS) ×1 IMPLANT
NEEDLE 22X1 1/2 (OR ONLY) (NEEDLE) IMPLANT
NS IRRIG 1000ML POUR BTL (IV SOLUTION) ×7 IMPLANT
PACK ORTHO EXTREMITY (CUSTOM PROCEDURE TRAY) ×3 IMPLANT
PAD ABD 8X10 STRL (GAUZE/BANDAGES/DRESSINGS) ×2 IMPLANT
PAD ARMBOARD 7.5X6 YLW CONV (MISCELLANEOUS) ×4 IMPLANT
PAD CAST 3X4 CTTN HI CHSV (CAST SUPPLIES) ×1 IMPLANT
PAD CAST 4YDX4 CTTN HI CHSV (CAST SUPPLIES) ×1 IMPLANT
PADDING CAST COTTON 3X4 STRL (CAST SUPPLIES) ×3
PADDING CAST COTTON 4X4 STRL (CAST SUPPLIES) ×3
PEG LOCK SMOOTH 2.2X16MM STE (Peg) ×2 IMPLANT
PEG LOCK SMOOTH 2.2X20MM STE (Peg) ×4 IMPLANT
PLATE STANDARD DVR LEFT (Plate) ×3 IMPLANT
PLATE STD DVR LT 24X51 (Plate) IMPLANT
SCREW  LP NL 2.7X10MM (Screw) ×2 IMPLANT
SCREW 2.7X12MM (Screw) ×2 IMPLANT
SCREW 2.7X14MM (Screw) ×2 IMPLANT
SCREW BN 14X2.7XNONLOCK 3 LD (Screw) IMPLANT
SCREW LOCK 16X2.7X 3 LD TPR (Screw) IMPLANT
SCREW LOCK 18X2.7X 3 LD TPR (Screw) IMPLANT
SCREW LOCKING 2.7MM 12MM STE (Screw) ×4 IMPLANT
SCREW LOCKING 2.7MM 14MM STE (Screw) ×2 IMPLANT
SCREW LOCKING 2.7X16 (Screw) ×3 IMPLANT
SCREW LOCKING 2.7X18 (Screw) ×3 IMPLANT
SCREW LP NL 2.7X10MM (Screw) IMPLANT
SCREW MULTI DIRECTIONAL 2.7X16 (Screw) ×2 IMPLANT
SCREW NLOCK 2.7X14 (Screw) ×1 IMPLANT
SLING ARM MED ADULT FOAM STRAP (SOFTGOODS) ×2 IMPLANT
SPLINT PLASTER CAST XFAST 4X15 (CAST SUPPLIES) IMPLANT
SPLINT PLASTER XTRA FAST SET 4 (CAST SUPPLIES) ×2
SPONGE GAUZE 4X4 12PLY STER LF (GAUZE/BANDAGES/DRESSINGS) ×2 IMPLANT
SPONGE LAP 4X18 X RAY DECT (DISPOSABLE) ×4 IMPLANT
STAPLER VISISTAT 35W (STAPLE) IMPLANT
STOCKINETTE IMPERVIOUS 9X36 MD (GAUZE/BANDAGES/DRESSINGS) ×2 IMPLANT
STRIP CLOSURE SKIN 1/2X4 (GAUZE/BANDAGES/DRESSINGS) ×1 IMPLANT
SUCTION FRAZIER TIP 10 FR DISP (SUCTIONS) ×3 IMPLANT
SUT ETHILON 3 0 PS 1 (SUTURE) ×6 IMPLANT
SUT PROLENE 3 0 PS 1 (SUTURE) IMPLANT
SUT VIC AB 2-0 CTB1 (SUTURE) IMPLANT
SUT VIC AB 3-0 X1 27 (SUTURE) ×2 IMPLANT
SYR CONTROL 10ML LL (SYRINGE) IMPLANT
SYSTEM CHEST DRAIN TLS 7FR (DRAIN) IMPLANT
TOWEL OR 17X24 6PK STRL BLUE (TOWEL DISPOSABLE) ×3 IMPLANT
TOWEL OR 17X26 10 PK STRL BLUE (TOWEL DISPOSABLE) ×3 IMPLANT
TUBE CONNECTING 12'X1/4 (SUCTIONS) ×1
TUBE CONNECTING 12X1/4 (SUCTIONS) ×2 IMPLANT
WATER STERILE IRR 1000ML POUR (IV SOLUTION) ×1 IMPLANT
YANKAUER SUCT BULB TIP NO VENT (SUCTIONS) IMPLANT

## 2013-12-13 NOTE — Discharge Instructions (Signed)

## 2013-12-13 NOTE — Anesthesia Preprocedure Evaluation (Signed)
Anesthesia Evaluation  Patient identified by MRN, date of birth, ID band Patient awake    Reviewed: Allergy & Precautions, H&P , NPO status , Patient's Chart, lab work & pertinent test results  History of Anesthesia Complications Negative for: history of anesthetic complications  Airway Mallampati: II TM Distance: >3 FB Neck ROM: Full    Dental  (+) Teeth Intact, Dental Advisory Given   Pulmonary Current Smoker,    Pulmonary exam normal       Cardiovascular negative cardio ROS      Neuro/Psych negative neurological ROS  negative psych ROS   GI/Hepatic negative GI ROS, Neg liver ROS,   Endo/Other    Renal/GU      Musculoskeletal   Abdominal   Peds  Hematology negative hematology ROS (+)   Anesthesia Other Findings   Reproductive/Obstetrics                           Anesthesia Physical Anesthesia Plan  ASA: II  Anesthesia Plan: General   Post-op Pain Management:    Induction: Intravenous  Airway Management Planned: LMA  Additional Equipment:   Intra-op Plan:   Post-operative Plan: Extubation in OR  Informed Consent: I have reviewed the patients History and Physical, chart, labs and discussed the procedure including the risks, benefits and alternatives for the proposed anesthesia with the patient or authorized representative who has indicated his/her understanding and acceptance.   Dental advisory given  Plan Discussed with: CRNA, Anesthesiologist and Surgeon  Anesthesia Plan Comments:         Anesthesia Quick Evaluation

## 2013-12-13 NOTE — Brief Op Note (Signed)
12/13/2013  10:07 AM  PATIENT:  Kristina Dennis  68 y.o. female  PRE-OPERATIVE DIAGNOSIS:  LEFT WRIST FRACTURE  POST-OPERATIVE DIAGNOSIS:  LEFT WRIST FRACTURE  PROCEDURE:  Procedure(s): OPEN REDUCTION INTERNAL FIXATION (ORIF) WRIST FRACTURE via corrective osteotomy  SURGEON:  Surgeon(s): Cammy Copa, MD  ASSISTANT: Skip Mayer PA  ANESTHESIA:   regional and general  EBL: 15 ml    Total I/O In: 1500 [I.V.:1500] Out: 10 [Blood:10]  BLOOD ADMINISTERED: none  DRAINS: none   LOCAL MEDICATIONS USED:  none  SPECIMEN:  No Specimen  COUNTS:  YES  TOURNIQUET:   Total Tourniquet Time Documented: Upper Arm (Left) - 91 minutes Total: Upper Arm (Left) - 91 minutes   DICTATION: .Other Dictation: Dictation Number [id number]  PLAN OF CARE: dc to home after pacu  PATIENT DISPOSITION:  PACU - hemodynamically stable

## 2013-12-13 NOTE — Transfer of Care (Signed)
Immediate Anesthesia Transfer of Care Note  Patient: Kristina Dennis  Procedure(s) Performed: Procedure(s): OPEN REDUCTION INTERNAL FIXATION (ORIF) WRIST FRACTURE (Left)  Patient Location: PACU  Anesthesia Type:GA combined with regional for post-op pain  Level of Consciousness: awake, alert , oriented and patient cooperative  Airway & Oxygen Therapy: Patient Spontanous Breathing and Patient connected to nasal cannula oxygen  Post-op Assessment: Report given to PACU RN and Post -op Vital signs reviewed and stable  Post vital signs: Reviewed and stable  Complications: No apparent anesthesia complications

## 2013-12-13 NOTE — H&P (Signed)
Kristina Dennis is an 68 y.o. female.   Chief Complaint:left wrist pain HPI: LHD female 4 .5 weeks out from left wrist fracture. Initially nomdisplaced but became displaced by the time she came to clinic. Now with decreased motion and increasing deformity.  Past Medical History  Diagnosis Date  . Arthritis   . Palpitations   . Microscopic colitis   . Osteoporosis   . Rhinitis   . Pneumonia     hx    Past Surgical History  Procedure Laterality Date  . Knee surgery Right     arthroscopy  . Tubal ligation    . Tonsillectomy    . Bunionectomy Left   . Dilation and curettage of uterus      Family History  Problem Relation Age of Onset  . Hypertension Father   . Cancer Sister     rectal   . Rectal cancer Sister 63  . Arrhythmia Mother    Social History:  reports that she has been smoking Cigarettes.  She has a 10 pack-year smoking history. She has never used smokeless tobacco. She reports that she drinks alcohol. She reports that she does not use illicit drugs.  Allergies:  Allergies  Allergen Reactions  . Hydrocodone Nausea And Vomiting  . Niacin And Related Nausea And Vomiting  . Sulfa Antibiotics Nausea And Vomiting    Violently ill  . Latex Rash    Medications Prior to Admission  Medication Sig Dispense Refill  . aspirin 81 MG tablet Take 81 mg by mouth daily.       . calcium & magnesium carbonates (MYLANTA) 311-232 MG per tablet Take 500 tablets by mouth daily. Not mylanta      . CALCIUM-VITAMIN D PO Take 1 tablet by mouth 2 (two) times daily.       . Cholecalciferol (VITAMIN D) 2000 UNITS CAPS Take 2,000 Units by mouth 2 (two) times daily.      Marland Kitchen doxycycline (VIBRAMYCIN) 100 MG capsule Take 100 mg by mouth 2 (two) times daily.      . Multiple Vitamins-Minerals (MULTIVITAMIN WITH MINERALS) tablet Take 1 tablet by mouth daily.      . NON FORMULARY Apply 1 application to eye 3 (three) times daily as needed (for dry eyes). Ocusoft (Wipes for the eyes)      . Omega-3  300 MG CAPS Take 300 mg by mouth 2 (two) times daily.      Bertram Gala Glycol-Propyl Glycol (SYSTANE OP) Apply 1 drop to eye 3 (three) times daily as needed (for dry eyes). Both Eyes      . vitamin E 1000 UNIT capsule Take 1,000 Units by mouth daily.        No results found for this or any previous visit (from the past 48 hour(s)). No results found.  Review of Systems  Constitutional: Negative.   HENT: Negative.   Eyes: Negative.   Respiratory: Negative.   Cardiovascular: Negative.   Gastrointestinal: Negative.   Genitourinary: Negative.   Musculoskeletal: Positive for joint pain.  Skin: Negative.   Neurological: Negative.   Endo/Heme/Allergies: Negative.   Psychiatric/Behavioral: Negative.     Blood pressure 154/76, pulse 74, temperature 98 F (36.7 C), resp. rate 16, weight 45.36 kg (100 lb), last menstrual period 05/03/1987, SpO2 98.00%. Physical Exam  Constitutional: She appears well-developed.  HENT:  Head: Normocephalic.  Eyes: Pupils are equal, round, and reactive to light.  Neck: Normal range of motion.  Cardiovascular: Normal rate.   Respiratory: Effort normal.  Neurological: She is alert.  Skin: Skin is warm.  Psychiatric: She has a normal mood and affect.   left wrist swelling and limited motion - decreased grip strength rad 2/4 - sensation ok m/r/uln - spider bite legs improving right  Assessment/Plan Left displaced wrist fracture with almost 30 degrees of deformity - needs osteotomy and fixation for progressive deformity - All risks and benefits discussed with patient - all ? answered  DEAN,GREGORY SCOTT 12/13/2013, 7:26 AM

## 2013-12-13 NOTE — Op Note (Signed)
NAME:  Kristina Dennis, Kristina Dennis NO.:  [id number]  MEDICAL RECORD NO.:  [id number]  LOCATION:  [facility]                         FACILITY:  [facility]  PHYSICIAN:  Burnard Bunting, M.D.    DATE OF BIRTH:  1946-03-26  DATE OF PROCEDURE: DATE OF DISCHARGE:                              OPERATIVE REPORT   PREOPERATIVE DIAGNOSIS:  Displacement, healed malangulated left distal radius fracture.  POSTOPERATIVE DIAGNOSIS:  Displacement, healed malangulated left distal radius fracture.  PROCEDURE:  Distal radius osteotomy and fixation.  SURGEON:  Burnard Bunting, M.D.  ASSISTANT:  Skip Mayer, MD.  INDICATIONS:  Kristina Dennis is a patient with left distal radius fracture. Initially, she was seen in the urgent care.  Wrist splint was applied. She saw me 3-1/2 weeks later.  The fracture had shifted in significant dorsal angulation. The patient had pain and decreased range of motion. She presents now for operative management.  She did have a spider bite on her right leg which delayed operative therapy for week.  The patient understands the risks and benefits.  PROCEDURE IN DETAIL:  The patient was brought to operating room, where general endotracheal anesthesia was induced.  Preoperative antibiotics administered.  Time-out was called.  Left wrist was prescrubbed with alcohol and Betadine.  Laminar air dry, prepped with DuraPrep solution and draped in sterile manner.  Collier Flowers was used to cover the operative field.  Preoperative range of motion assessment included about 30 degrees of flexion and about 50 degrees of extension.  Left arm was elevated __________ Esmarch wrap.  Tourniquet was then inflated.  Volar approach to the wrist was made.  Skin and subcu tissue sharply divided over the FCR tendon distally.  The FCR tendon was mobilized radially. The pronator quadratus was then incised on the radial border of the distal radius and then subperiosteal elevation was performed.  Fracture line was  identified.  It was essentially healed.  The distal radial plate from [employer] was incised.  It was then placed through __________ correct location __________ line was identified.  Osteotomy was performed and the wrist and limb length and inclination was restored from about 25 degrees apex dorsal angulation to neutral or slightly volar joint angulation on the lateral.  The osteotomy was then fixed with the plate with a combination of locking screws and nonlocking screws proximally and locking pegs and screws distally.  The patient's bone was osteoporotic.  Care was taken to avoid injury to the median nerve with retraction.  The bone graft was then placed __________ into the distal radius fracture site.  At this time, thorough irrigation was performed.  Tourniquet was released after 91 minutes and no major bleeding points were encountered.  The skin was closed using 3-0 Vicryl and 3-0 nylon.  Well-padded splint applied.  The patient tolerated the procedure well without immediate complications.  Skip Mayer assistance required at all times during the case for retraction, opening, closing, limb positioning.  Her assistance was a medical necessity.  Wrist range of motion following osteotomy and fixation improved to about 80 degrees of flexion and 70 degrees of extension.  The patient was transferred to recovery room in stable condition.  Fluoroscopy was utilized in  the AP and lateral planes to make sure that the screw length was correct.  Locking construct utilized because of the significant bone osteoporosis.     Burnard Bunting, M.D.     GSD/MEDQ  D:  12/13/2013  T:  12/13/2013  Job:  [id number]

## 2013-12-13 NOTE — Anesthesia Postprocedure Evaluation (Signed)
Anesthesia Post Note  Patient: Kristina Dennis  Procedure(s) Performed: Procedure(s) (LRB): OPEN REDUCTION INTERNAL FIXATION (ORIF) WRIST FRACTURE (Left)  Anesthesia type: general  Patient location: PACU  Post pain: Pain level controlled  Post assessment: Patient's Cardiovascular Status Stable  Last Vitals:  Filed Vitals:   12/13/13 1030  BP: 132/70  Pulse: 81  Temp: 36.7 C  Resp: 20    Post vital signs: Reviewed and stable  Level of consciousness: sedated  Complications: No apparent anesthesia complications

## 2013-12-14 ENCOUNTER — Encounter (HOSPITAL_COMMUNITY): Payer: Self-pay | Admitting: Orthopedic Surgery

## 2014-06-16 ENCOUNTER — Other Ambulatory Visit (HOSPITAL_COMMUNITY): Payer: Self-pay

## 2014-06-16 ENCOUNTER — Inpatient Hospital Stay (HOSPITAL_COMMUNITY)
Admission: EM | Admit: 2014-06-16 | Discharge: 2014-06-18 | DRG: 389 | Disposition: A | Discharge status: Home / Self Care | Payer: Medicare Other | Attending: Internal Medicine | Admitting: Internal Medicine

## 2014-06-16 ENCOUNTER — Encounter (HOSPITAL_COMMUNITY): Payer: Self-pay | Admitting: Emergency Medicine

## 2014-06-16 ENCOUNTER — Emergency Department (HOSPITAL_COMMUNITY): Payer: Medicare Other

## 2014-06-16 DIAGNOSIS — K5669 Other intestinal obstruction: Secondary | ICD-10-CM | POA: Diagnosis not present

## 2014-06-16 DIAGNOSIS — F17210 Nicotine dependence, cigarettes, uncomplicated: Secondary | ICD-10-CM | POA: Diagnosis present

## 2014-06-16 DIAGNOSIS — Z9851 Tubal ligation status: Secondary | ICD-10-CM

## 2014-06-16 DIAGNOSIS — F1010 Alcohol abuse, uncomplicated: Secondary | ICD-10-CM | POA: Diagnosis present

## 2014-06-16 DIAGNOSIS — Z882 Allergy status to sulfonamides status: Secondary | ICD-10-CM | POA: Diagnosis not present

## 2014-06-16 DIAGNOSIS — E872 Acidosis: Secondary | ICD-10-CM | POA: Diagnosis present

## 2014-06-16 DIAGNOSIS — E860 Dehydration: Secondary | ICD-10-CM | POA: Diagnosis present

## 2014-06-16 DIAGNOSIS — K56600 Partial intestinal obstruction, unspecified as to cause: Secondary | ICD-10-CM | POA: Diagnosis present

## 2014-06-16 DIAGNOSIS — N35919 Unspecified urethral stricture, male, unspecified site: Secondary | ICD-10-CM | POA: Diagnosis present

## 2014-06-16 DIAGNOSIS — M1990 Unspecified osteoarthritis, unspecified site: Secondary | ICD-10-CM | POA: Diagnosis present

## 2014-06-16 DIAGNOSIS — Z681 Body mass index (BMI) 19 or less, adult: Secondary | ICD-10-CM

## 2014-06-16 DIAGNOSIS — H109 Unspecified conjunctivitis: Secondary | ICD-10-CM | POA: Diagnosis present

## 2014-06-16 DIAGNOSIS — E876 Hypokalemia: Secondary | ICD-10-CM | POA: Diagnosis not present

## 2014-06-16 DIAGNOSIS — R636 Underweight: Secondary | ICD-10-CM | POA: Diagnosis present

## 2014-06-16 DIAGNOSIS — R1310 Dysphagia, unspecified: Secondary | ICD-10-CM | POA: Diagnosis present

## 2014-06-16 DIAGNOSIS — N135 Crossing vessel and stricture of ureter without hydronephrosis: Secondary | ICD-10-CM | POA: Diagnosis present

## 2014-06-16 DIAGNOSIS — M810 Age-related osteoporosis without current pathological fracture: Secondary | ICD-10-CM | POA: Diagnosis present

## 2014-06-16 DIAGNOSIS — Z8701 Personal history of pneumonia (recurrent): Secondary | ICD-10-CM | POA: Diagnosis not present

## 2014-06-16 DIAGNOSIS — K649 Unspecified hemorrhoids: Secondary | ICD-10-CM | POA: Diagnosis present

## 2014-06-16 DIAGNOSIS — R002 Palpitations: Secondary | ICD-10-CM | POA: Diagnosis present

## 2014-06-16 DIAGNOSIS — N3289 Other specified disorders of bladder: Secondary | ICD-10-CM | POA: Diagnosis present

## 2014-06-16 DIAGNOSIS — F1910 Other psychoactive substance abuse, uncomplicated: Secondary | ICD-10-CM | POA: Diagnosis present

## 2014-06-16 DIAGNOSIS — R197 Diarrhea, unspecified: Secondary | ICD-10-CM | POA: Diagnosis not present

## 2014-06-16 DIAGNOSIS — Z91040 Latex allergy status: Secondary | ICD-10-CM | POA: Diagnosis not present

## 2014-06-16 DIAGNOSIS — A084 Viral intestinal infection, unspecified: Secondary | ICD-10-CM | POA: Diagnosis present

## 2014-06-16 DIAGNOSIS — R109 Unspecified abdominal pain: Secondary | ICD-10-CM

## 2014-06-16 HISTORY — DX: Noninfective gastroenteritis and colitis, unspecified: K52.9

## 2014-06-16 LAB — CBC WITH DIFFERENTIAL/PLATELET
Basophils Absolute: 0.1 10*3/uL (ref 0.0–0.1)
Basophils Relative: 1 % (ref 0–1)
EOS PCT: 2 % (ref 0–5)
Eosinophils Absolute: 0.2 10*3/uL (ref 0.0–0.7)
HEMATOCRIT: 38.2 % (ref 36.0–46.0)
Hemoglobin: 12.9 g/dL (ref 12.0–15.0)
LYMPHS ABS: 2 10*3/uL (ref 0.7–4.0)
Lymphocytes Relative: 25 % (ref 12–46)
MCH: 33.2 pg (ref 26.0–34.0)
MCHC: 33.8 g/dL (ref 30.0–36.0)
MCV: 98.2 fL (ref 78.0–100.0)
MONO ABS: 0.6 10*3/uL (ref 0.1–1.0)
Monocytes Relative: 7 % (ref 3–12)
Neutro Abs: 5.1 10*3/uL (ref 1.7–7.7)
Neutrophils Relative %: 65 % (ref 43–77)
Platelets: 305 10*3/uL (ref 150–400)
RBC: 3.89 MIL/uL (ref 3.87–5.11)
RDW: 13.8 % (ref 11.5–15.5)
WBC: 7.9 10*3/uL (ref 4.0–10.5)

## 2014-06-16 LAB — COMPREHENSIVE METABOLIC PANEL
ALBUMIN: 4.2 g/dL (ref 3.5–5.2)
ALK PHOS: 67 U/L (ref 39–117)
ALT: 15 U/L (ref 0–35)
ANION GAP: 11 (ref 5–15)
AST: 35 U/L (ref 0–37)
BILIRUBIN TOTAL: 0.4 mg/dL (ref 0.3–1.2)
BUN: 6 mg/dL (ref 6–23)
CHLORIDE: 101 mmol/L (ref 96–112)
CO2: 24 mmol/L (ref 19–32)
Calcium: 9.1 mg/dL (ref 8.4–10.5)
Creatinine, Ser: 0.5 mg/dL (ref 0.50–1.10)
GLUCOSE: 113 mg/dL — AB (ref 70–99)
POTASSIUM: 3.5 mmol/L (ref 3.5–5.1)
Sodium: 136 mmol/L (ref 135–145)
Total Protein: 7.3 g/dL (ref 6.0–8.3)

## 2014-06-16 LAB — URINALYSIS, ROUTINE W REFLEX MICROSCOPIC
Bilirubin Urine: NEGATIVE
Glucose, UA: NEGATIVE mg/dL
KETONES UR: NEGATIVE mg/dL
Leukocytes, UA: NEGATIVE
Nitrite: NEGATIVE
Protein, ur: NEGATIVE mg/dL
Specific Gravity, Urine: 1.016 (ref 1.005–1.030)
UROBILINOGEN UA: 0.2 mg/dL (ref 0.0–1.0)
pH: 5 (ref 5.0–8.0)

## 2014-06-16 LAB — LIPASE, BLOOD: Lipase: 19 U/L (ref 11–59)

## 2014-06-16 LAB — URINE MICROSCOPIC-ADD ON

## 2014-06-16 LAB — CLOSTRIDIUM DIFFICILE BY PCR: CDIFFPCR: NEGATIVE

## 2014-06-16 MED ORDER — THIAMINE HCL 100 MG/ML IJ SOLN
Freq: Once | INTRAVENOUS | Status: AC
  Administered 2014-06-16: 14:00:00 via INTRAVENOUS
  Filled 2014-06-16: qty 1000

## 2014-06-16 MED ORDER — FOLIC ACID 1 MG PO TABS
1.0000 mg | ORAL_TABLET | Freq: Every day | ORAL | Status: DC
  Administered 2014-06-17 – 2014-06-18 (×2): 1 mg via ORAL
  Filled 2014-06-16 (×2): qty 1

## 2014-06-16 MED ORDER — HYDROMORPHONE HCL 1 MG/ML IJ SOLN
1.0000 mg | Freq: Once | INTRAMUSCULAR | Status: AC
Start: 2014-06-16 — End: 2014-06-16
  Administered 2014-06-16: 1 mg via INTRAVENOUS
  Filled 2014-06-16: qty 1

## 2014-06-16 MED ORDER — ACETAMINOPHEN 650 MG RE SUPP: 650.0000 mg | Freq: Four times a day (QID) | RECTAL | Status: DC | PRN

## 2014-06-16 MED ORDER — ONDANSETRON HCL 4 MG/2ML IJ SOLN: 4.0000 mg | Freq: Once | INTRAMUSCULAR | Status: DC

## 2014-06-16 MED ORDER — TAMSULOSIN HCL 0.4 MG PO CAPS
0.4000 mg | ORAL_CAPSULE | Freq: Every day | ORAL | Status: DC
  Administered 2014-06-16 – 2014-06-18 (×3): 0.4 mg via ORAL
  Filled 2014-06-16 (×3): qty 1

## 2014-06-16 MED ORDER — LORAZEPAM 2 MG/ML IJ SOLN: 1.0000 mg | Freq: Four times a day (QID) | INTRAMUSCULAR | Status: DC | PRN

## 2014-06-16 MED ORDER — LORAZEPAM 1 MG PO TABS
1.0000 mg | ORAL_TABLET | Freq: Four times a day (QID) | ORAL | Status: DC | PRN
Start: 2014-06-16 — End: 2014-06-18

## 2014-06-16 MED ORDER — IOHEXOL 300 MG/ML  SOLN
80.0000 mL | Freq: Once | INTRAMUSCULAR | Status: AC | PRN
  Administered 2014-06-16: 80 mL via INTRAVENOUS

## 2014-06-16 MED ORDER — VITAMIN B-1 100 MG PO TABS
100.0000 mg | ORAL_TABLET | Freq: Every day | ORAL | Status: DC
  Administered 2014-06-17 – 2014-06-18 (×2): 100 mg via ORAL
  Filled 2014-06-16 (×2): qty 1

## 2014-06-16 MED ORDER — ACETAMINOPHEN 325 MG PO TABS
650.0000 mg | ORAL_TABLET | Freq: Four times a day (QID) | ORAL | Status: DC | PRN
  Administered 2014-06-17: 650 mg via ORAL
  Filled 2014-06-16: qty 2

## 2014-06-16 MED ORDER — ONDANSETRON HCL 4 MG/2ML IJ SOLN
4.0000 mg | Freq: Once | INTRAMUSCULAR | Status: AC
  Administered 2014-06-16: 4 mg via INTRAVENOUS
  Filled 2014-06-16: qty 2

## 2014-06-16 MED ORDER — HEPARIN SODIUM (PORCINE) 5000 UNIT/ML IJ SOLN
5000.0000 [IU] | Freq: Three times a day (TID) | INTRAMUSCULAR | Status: DC
  Administered 2014-06-16 – 2014-06-18 (×6): 5000 [IU] via SUBCUTANEOUS
  Filled 2014-06-16 (×5): qty 1

## 2014-06-16 MED ORDER — IOHEXOL 300 MG/ML  SOLN: 50.0000 mL | Freq: Once | INTRAMUSCULAR | Status: DC | PRN

## 2014-06-16 MED ORDER — HYDROMORPHONE HCL 1 MG/ML IJ SOLN: 1.0000 mg | Freq: Once | INTRAMUSCULAR | Status: DC

## 2014-06-16 MED ORDER — ADULT MULTIVITAMIN W/MINERALS CH
1.0000 | ORAL_TABLET | Freq: Every day | ORAL | Status: DC
  Administered 2014-06-17 – 2014-06-18 (×2): 1 via ORAL
  Filled 2014-06-16 (×2): qty 1

## 2014-06-16 MED ORDER — SODIUM CHLORIDE 0.9 % IV SOLN
INTRAVENOUS | Status: DC
  Administered 2014-06-16 – 2014-06-17 (×2): via INTRAVENOUS

## 2014-06-16 MED ORDER — MORPHINE SULFATE 4 MG/ML IJ SOLN
4.0000 mg | Freq: Once | INTRAMUSCULAR | Status: AC
  Administered 2014-06-16: 4 mg via INTRAVENOUS
  Filled 2014-06-16: qty 1

## 2014-06-16 MED ORDER — THIAMINE HCL 100 MG/ML IJ SOLN: 100.0000 mg | Freq: Every day | INTRAMUSCULAR | Status: DC

## 2014-06-16 NOTE — Consult Note (Signed)
Reason for Consult:PSBO Referring Physician: Dr. Veryl Speak  Kristina Dennis is an 69 y.o. female.  HPI: previous history of palpitations presented to [facility] with abdominal pain and diarrhea.  Duration of symptoms is 5 days. Onset was sudden.  Coarse is waxes and wanes.  Location is lower abdomen.  Time pattern is constant.  She reports 10+ loose BMs per day with BRB.  She deneis fever or chills.  Denies ill close contacts.  She was on levaquin in January for sinus infection.  She began taking Imodium on Sunday and her diarrhea resolved only to return on Tuesday with worsening pain last night at about 7PM.  She reports one episode of emesis this morning, it was clear, non bilious or fecal.  She is not nauseated now. She only complains of abdominal pain/diarrhea.  Associated with chills.  She has sweats.  She was seen by her PCP on Sunday and given flomax for apparent bladder spasms.  Her previous procedures include tubal ligation, no other surgeries.   No previous bowel obstructions.  She also complains dysphagia.  She has been seen by GI in the past.    Past Medical History  Diagnosis Date  . Arthritis   . Palpitations   . Microscopic colitis   . Osteoporosis   . Rhinitis   . Pneumonia     hx    Past Surgical History  Procedure Laterality Date  . Knee surgery Right     arthroscopy  . Tubal ligation    . Tonsillectomy    . Bunionectomy Left   . Dilation and curettage of uterus    . Orif wrist fracture Left 12/13/2013    Procedure: OPEN REDUCTION INTERNAL FIXATION (ORIF) WRIST FRACTURE;  Surgeon: Meredith Pel, MD;  Location: [place];  Service: Orthopedics;  Laterality: Left;    Family History  Problem Relation Age of Onset  . Hypertension Father   . Cancer Sister     rectal   . Rectal cancer Sister 56  . Arrhythmia Mother     Social History:  reports that she has been smoking Cigarettes.  She has a 25 pack-year smoking history. She has never used smokeless tobacco. She  reports that she drinks alcohol. She reports that she does not use illicit drugs.  Allergies:  Allergies  Allergen Reactions  . Hydrocodone Nausea And Vomiting  . Niacin And Related Nausea And Vomiting  . Sulfa Antibiotics Nausea And Vomiting    Violently ill  . Latex Rash    Medications:  Prior to Admission medications   Medication Sig Start Date End Date Taking? Authorizing Provider  tamsulosin (FLOMAX) 0.4 MG CAPS capsule Take 1 capsule by mouth daily. 06/12/14  Yes Historical Provider, MD  ondansetron (ZOFRAN) 4 MG tablet Take 1 tablet (4 mg total) by mouth every 8 (eight) hours as needed for nausea or vomiting. Patient not taking: Reported on 06/16/2014 12/13/13   Meredith Pel, MD  oxyCODONE-acetaminophen (ROXICET) 5-325 MG per tablet Take 1-2 tablets by mouth every 6 (six) hours as needed for severe pain. Patient not taking: Reported on 06/16/2014 12/13/13   Meredith Pel, MD     Results for orders placed or performed during the hospital encounter of 06/16/14 (from the past 48 hour(s))  CBC with Differential     Status: None   Collection Time: 06/16/14  3:16 AM  Result Value Ref Range   WBC 7.9 4.0 - 10.5 K/uL   RBC 3.89 3.87 - 5.11 MIL/uL  Hemoglobin 12.9 12.0 - 15.0 g/dL   HCT 38.2 36.0 - 46.0 %   MCV 98.2 78.0 - 100.0 fL   MCH 33.2 26.0 - 34.0 pg   MCHC 33.8 30.0 - 36.0 g/dL   RDW 13.8 11.5 - 15.5 %   Platelets 305 150 - 400 K/uL   Neutrophils Relative % 65 43 - 77 %   Neutro Abs 5.1 1.7 - 7.7 K/uL   Lymphocytes Relative 25 12 - 46 %   Lymphs Abs 2.0 0.7 - 4.0 K/uL   Monocytes Relative 7 3 - 12 %   Monocytes Absolute 0.6 0.1 - 1.0 K/uL   Eosinophils Relative 2 0 - 5 %   Eosinophils Absolute 0.2 0.0 - 0.7 K/uL   Basophils Relative 1 0 - 1 %   Basophils Absolute 0.1 0.0 - 0.1 K/uL  Comprehensive metabolic panel     Status: Abnormal   Collection Time: 06/16/14  3:16 AM  Result Value Ref Range   Sodium 136 135 - 145 mmol/L   Potassium 3.5 3.5 - 5.1 mmol/L    Chloride 101 96 - 112 mmol/L   CO2 24 19 - 32 mmol/L   Glucose, Bld 113 (H) 70 - 99 mg/dL   BUN 6 6 - 23 mg/dL   Creatinine, Ser 0.50 0.50 - 1.10 mg/dL   Calcium 9.1 8.4 - 10.5 mg/dL   Total Protein 7.3 6.0 - 8.3 g/dL   Albumin 4.2 3.5 - 5.2 g/dL   AST 35 0 - 37 U/L   ALT 15 0 - 35 U/L   Alkaline Phosphatase 67 39 - 117 U/L   Total Bilirubin 0.4 0.3 - 1.2 mg/dL   GFR calc non Af Amer >90 >90 mL/min   GFR calc Af Amer >90 >90 mL/min    Comment: (NOTE) The eGFR has been calculated using the CKD EPI equation. This calculation has not been validated in all clinical situations. eGFR's persistently <90 mL/min signify possible Chronic Kidney Disease.    Anion gap 11 5 - 15  Lipase, blood     Status: None   Collection Time: 06/16/14  3:16 AM  Result Value Ref Range   Lipase 19 11 - 59 U/L  Urinalysis, Routine w reflex microscopic     Status: Abnormal   Collection Time: 06/16/14  3:46 AM  Result Value Ref Range   Color, Urine YELLOW YELLOW   APPearance CLEAR CLEAR   Specific Gravity, Urine 1.016 1.005 - 1.030   pH 5.0 5.0 - 8.0   Glucose, UA NEGATIVE NEGATIVE mg/dL   Hgb urine dipstick SMALL (A) NEGATIVE   Bilirubin Urine NEGATIVE NEGATIVE   Ketones, ur NEGATIVE NEGATIVE mg/dL   Protein, ur NEGATIVE NEGATIVE mg/dL   Urobilinogen, UA 0.2 0.0 - 1.0 mg/dL   Nitrite NEGATIVE NEGATIVE   Leukocytes, UA NEGATIVE NEGATIVE  Urine microscopic-add on     Status: Abnormal   Collection Time: 06/16/14  3:46 AM  Result Value Ref Range   Squamous Epithelial / LPF RARE RARE   WBC, UA 0-2 <3 WBC/hpf   RBC / HPF 3-6 <3 RBC/hpf   Bacteria, UA FEW (A) RARE   Casts HYALINE CASTS (A) NEGATIVE   Urine-Other MUCOUS PRESENT     Ct Abdomen Pelvis W Contrast  06/16/2014   CLINICAL DATA:  Diarrhea and abdominal pain for 3-4 days. Nausea. Blood in the stool  EXAM: CT ABDOMEN AND PELVIS WITH CONTRAST  TECHNIQUE: Multidetector CT imaging of the abdomen and pelvis was performed using  the standard  protocol following bolus administration of intravenous contrast.  CONTRAST:  105m OMNIPAQUE IOHEXOL 300 MG/ML  SOLN  COMPARISON:  CT of the abdomen and pelvis 07/05/2004  FINDINGS: The lung bases are clear without focal nodule, mass, or airspace disease. The heart size is normal. No significant pleural or pericardial effusion is present.  The liver is within normal limits. Spleen is small. The stomach is contracted. No focal lesions are evident. The duodenum is somewhat dilated. Common bile duct and gallbladder are normal. The pancreas is within normal limits.  The adrenal glands are normal bilaterally. A 3 cm benign cyst is noted in the right kidney. The kidneys and ureters are otherwise within normal limits.  The distal colon is mostly collapsed. There is fluid within the transverse colon. The appendix is visualized and normal.  Oral contrast is seen within the distal bowel. Multiple loops of bowel are slightly distended, but less than 3 cm. A distal transition is evident without a mass lesion. The terminal ileum is collapsed.  The uterus an at neck is are within normal limits. No significant adenopathy is present. There is no free fluid. Extensive atherosclerotic calcifications are present in the aorta without aneurysm.  Urinary bladder is within normal limits. The bone windows are unremarkable.  IMPRESSION: 1. Partial distal small bowel obstruction without a distinct transition or mass lesion. 2. No free fluid or free air. 3. Moderate atherosclerotic disease. 4. Benign appearing cyst in the right kidney.   Electronically Signed   By: CSan MorelleM.D.   On: 06/16/2014 07:41    Review of Systems  All other systems reviewed and are negative.  Blood pressure 113/66, pulse 69, temperature 98 F (36.7 C), temperature source Oral, resp. rate 16, last menstrual period 05/03/1987, SpO2 96 %. Physical Exam  Constitutional: She is oriented to person, place, and time. She appears well-developed and  well-nourished. No distress.  Cardiovascular: Normal rate, regular rhythm, normal heart sounds and intact distal pulses.  Exam reveals no gallop and no friction rub.   No murmur heard. Respiratory: Breath sounds normal. No respiratory distress. She has no wheezes. She has no rales. She exhibits no tenderness.  GI: Soft. She exhibits no distension and no mass. There is no tenderness. There is no rebound and no guarding.  Musculoskeletal: Normal range of motion. She exhibits no edema or tenderness.  Neurological: She is alert and oriented to person, place, and time.  Skin: Skin is warm and dry. No rash noted. She is not diaphoretic. No erythema. No pallor.  Psychiatric: She has a normal mood and affect. Her behavior is normal. Thought content normal.    Assessment/Plan: Abdominal pain and diarrhea: clinically she does not look obstructed.  No white count.  Likely viral of c diff given recent antibiotics.  No need for NGT, may repeat AXR in AM.  Non surgical abdomen.  Would recommend GI consult for c/o dysphagia.  Thanks for the consult.   Ronaldo Crilly  ANP-BC  06/16/2014, 10:29 AM

## 2014-06-16 NOTE — ED Notes (Signed)
General surgery at bedside. 

## 2014-06-16 NOTE — ED Notes (Signed)
Pt reports lower abdominal pain since Sunday with nvd.pt [facility].

## 2014-06-16 NOTE — ED Notes (Signed)
Report attempted 

## 2014-06-16 NOTE — Progress Notes (Signed)
Nutrition Brief Note  Patient identified on the Malnutrition Screening Tool (MST) Report  Wt Readings from Last 15 Encounters:  12/13/13 100 lb (45.36 kg)  05/17/11 105 lb (47.628 kg)  05/03/11 105 lb (47.628 kg)  04/02/11 105 lb 1.9 oz (47.682 kg)  03/12/11 100 lb (45.36 kg)   68yo F w/ PMH benign heart palpitations and tobacco and EtOH abuse presents with diarrhea and cramping abdominal pain.  Pt reports good appetite up until Saturday, which she attributed to diarrhea and abdominal pain. She reports UBW of 100-105#. She reports she lost about 5-7# PTA due to dehydration. Weight on bed scale was 107.1#. She reveals "it's probably because I'm hydrated now".   Calculated BMI of 17.3. Patient meets criteria for underweight based on current BMI.   Current diet order is NPO, patient is consuming approximately n/a% of meals at this time. Labs and medications reviewed.   No nutrition interventions warranted at this time. If nutrition issues arise, please consult RD.   Makaia Rappa A. Mayford KnifeWilliams, RD, LDN, CDE Pager: [phone] After hours Pager: [phone]

## 2014-06-16 NOTE — ED Provider Notes (Signed)
CSN: [id number]     Arrival date & time 06/16/14  0250 History  This chart was scribed for Geoffery Lyons, MD by Luisa Dago, ED Scribe. This patient was seen in room A04C/A04C and the patient's care was started at 3:13 AM.  Chief Complaint  Patient presents with  . Abdominal Pain   The history is provided by the patient and medical records. No language interpreter was used.   HPI Comments: Kristina Dennis is a 69 y.o. female who presents to the Emergency Department complaining of gradual onset abdominal pain that started approximately 4 days ago. Pt states that she was seen at [facility] and was treated for a UTI. However, her discomfort has not resolved. Pt does endorses associated diarrhea 3 days ago. She states that she has noticed some bright red streaks in her stool and she is suspicious that it may be secondary to her hx of hemorrhoids. Also admits to experiencing associated nausea. Pt denies any fever, neck pain, sore throat, visual disturbance, CP, cough, SOB, urinary symptoms, back pain, HA, weakness, numbness and rash as associated symptoms.     Past Medical History  Diagnosis Date  . Arthritis   . Palpitations   . Microscopic colitis   . Osteoporosis   . Rhinitis   . Pneumonia     hx   Past Surgical History  Procedure Laterality Date  . Knee surgery Right     arthroscopy  . Tubal ligation    . Tonsillectomy    . Bunionectomy Left   . Dilation and curettage of uterus    . Orif wrist fracture Left 12/13/2013    Procedure: OPEN REDUCTION INTERNAL FIXATION (ORIF) WRIST FRACTURE;  Surgeon: Cammy Copa, MD;  Location: [facility] OR;  Service: Orthopedics;  Laterality: Left;   Family History  Problem Relation Age of Onset  . Hypertension Father   . Cancer Sister     rectal   . Rectal cancer Sister 64  . Arrhythmia Mother    History  Substance Use Topics  . Smoking status: Current Some Day Smoker -- 0.50 packs/day for 20 years    Types: Cigarettes  . Smokeless tobacco: Never  Used  . Alcohol Use: Yes     Comment: socially   OB History    No data available     Review of Systems  Constitutional: Negative for fever and chills.  HENT: Negative for rhinorrhea and sore throat.   Eyes: Negative for visual disturbance.  Respiratory: Negative for cough and shortness of breath.   Cardiovascular: Negative for chest pain and leg swelling.  Gastrointestinal: Positive for nausea, vomiting, abdominal pain, diarrhea and blood in stool.  Genitourinary: Negative for dysuria.  Musculoskeletal: Negative for back pain and neck pain.  Skin: Negative for rash.  Neurological: Negative for dizziness, light-headedness and headaches.  Hematological: Does not bruise/bleed easily.  Psychiatric/Behavioral: Negative for confusion.    Allergies  Hydrocodone; Niacin and related; Sulfa antibiotics; and Latex  Home Medications   Prior to Admission medications   Medication Sig Start Date End Date Taking? Authorizing Provider  aspirin 81 MG tablet Take 81 mg by mouth daily.     Historical Provider, MD  calcium & magnesium carbonates (MYLANTA) 311-232 MG per tablet Take 500 tablets by mouth daily. Not mylanta    Historical Provider, MD  CALCIUM-VITAMIN D PO Take 1 tablet by mouth 2 (two) times daily.     Historical Provider, MD  Cholecalciferol (VITAMIN D) 2000 UNITS CAPS Take 2,000 Units by  mouth 2 (two) times daily.    Historical Provider, MD  Multiple Vitamins-Minerals (MULTIVITAMIN WITH MINERALS) tablet Take 1 tablet by mouth daily.    Historical Provider, MD  NON FORMULARY Apply 1 application to eye 3 (three) times daily as needed (for dry eyes). Ocusoft (Wipes for the eyes)    Historical Provider, MD  Omega-3 300 MG CAPS Take 300 mg by mouth 2 (two) times daily.    Historical Provider, MD  ondansetron (ZOFRAN) 4 MG tablet Take 1 tablet (4 mg total) by mouth every 8 (eight) hours as needed for nausea or vomiting. 12/13/13   Cammy CopaGregory Scott Dean, MD  oxyCODONE-acetaminophen (ROXICET)  5-325 MG per tablet Take 1-2 tablets by mouth every 6 (six) hours as needed for severe pain. 12/13/13   Cammy CopaGregory Scott Dean, MD  Polyethyl Glycol-Propyl Glycol (SYSTANE OP) Apply 1 drop to eye 3 (three) times daily as needed (for dry eyes). Both Eyes    Historical Provider, MD  vitamin E 1000 UNIT capsule Take 1,000 Units by mouth daily.    Historical Provider, MD   BP 140/98 mmHg  Pulse 80  Temp(Src) 98 F (36.7 C) (Oral)  Resp 16  SpO2 99%  LMP 05/03/1987   Physical Exam  Constitutional: She appears well-developed and well-nourished. No distress.  HENT:  Head: Normocephalic and atraumatic.  Eyes: Conjunctivae are normal. Right eye exhibits no discharge. Left eye exhibits no discharge.  Neck: Neck supple.  Cardiovascular: Normal rate, regular rhythm and normal heart sounds.  Exam reveals no gallop and no friction rub.   No murmur heard. Pulmonary/Chest: Effort normal and breath sounds normal. No respiratory distress.  Abdominal: Soft. She exhibits no distension. There is tenderness in the suprapubic area.  Tenderness to palpation to the suprapubic region.   Musculoskeletal: She exhibits no edema or tenderness.  Neurological: She is alert.  Skin: Skin is warm and dry.  Psychiatric: She has a normal mood and affect. Her behavior is normal. Thought content normal.  Nursing note and vitals reviewed.   ED Course  Procedures (including critical care time)  DIAGNOSTIC STUDIES: Oxygen Saturation is 99% on RA, normal by my interpretation.    COORDINATION OF CARE: 3:33 AM- Pt advised of plan for treatment and pt agrees.  Labs Review Labs Reviewed - No data to display  Imaging Review No results found.   EKG Interpretation None      MDM   Final diagnoses:  None    Patient is a 69 year old female who presents with a several day history of lower abdominal discomfort. Her laboratory studies are unremarkable, however CT scan reveals a partial small bowel obstruction with no  defined origin and no evidence for mass. Patient is quite uncomfortable and has required repeat doses of pain medication. I've discussed the case with general surgery who will evaluate the patient. I've also spoken with the teaching service resident who will evaluate and admit the patient.  I personally performed the services described in this documentation, which was scribed in my presence. The recorded information has been reviewed and is accurate.      Geoffery Lyonsouglas Shell Blanchette, MD 06/16/14 [phone]

## 2014-06-16 NOTE — H&P (Signed)
Date: 06/16/2014               Patient Name:  Kristina Dennis MRN: [id number]  DOB: 1945/07/17 Age / Sex: 69 y.o., female   PCP: Marva Panda, NP         Medical Service: Internal Medicine Teaching Service         Attending Physician: Dr. Farley Ly, MD    First Contact: Dr. Valentino Saxon Pager: [phone]  Second Contact: Dr. Andrey Campanile Pager: [phone]       After Hours (After 5p/  First Contact Pager: [phone]  weekends / holidays): Second Contact Pager: [phone]   Chief Complaint: diarrhea  History of Present Illness: 69yo F w/ PMH benign heart palpitations and tobacco and EtOH abuse presents with diarrhea and cramping abdominal pain. She states that her diarrhea and pain began on Sunday evening. The pain occurs immediately prior to the diarrhea and usually resolves after the diarrheal episode. She states that she had at least 11 liquid stools Sunday and decided to go to [facility], where her PCP is located. She was told that she likely had a viral illness and began taking an anti-diarrheal, which initially improved her diarrhea and abd pain until Tuesday evening, when the liquid stools returned. She has continued to take the anti-diarrheal without improvement in her symptoms. She denies any emesis but does endorse nausea since dinner last night. Now her pain is more constant and does not improve with each bowel movement. She states that her stools are completely liquid and green.   She denies sick contacts but did take 2 rounds of antibiotics last month for a sinus infection.   Meds: Current Facility-Administered Medications  Medication Dose Route Frequency Provider Last Rate Last Dose  . iohexol (OMNIPAQUE) 300 MG/ML solution 50 mL  50 mL Oral Once PRN Medication Radiologist, MD       Current Outpatient Prescriptions  Medication Sig Dispense Refill  . tamsulosin (FLOMAX) 0.4 MG CAPS capsule Take 1 capsule by mouth daily.  0  . ondansetron (ZOFRAN) 4 MG tablet Take  1 tablet (4 mg total) by mouth every 8 (eight) hours as needed for nausea or vomiting. (Patient not taking: Reported on 06/16/2014) 20 tablet 0  . oxyCODONE-acetaminophen (ROXICET) 5-325 MG per tablet Take 1-2 tablets by mouth every 6 (six) hours as needed for severe pain. (Patient not taking: Reported on 06/16/2014) 60 tablet 0    Allergies: Allergies as of 06/16/2014 - Review Complete 06/16/2014  Allergen Reaction Noted  . Hydrocodone Nausea And Vomiting 12/08/2013  . Niacin and related Nausea And Vomiting 04/02/2011  . Sulfa antibiotics Nausea And Vomiting 12/13/2013  . Latex Rash 12/08/2013   Past Medical History  Diagnosis Date  . Arthritis   . Palpitations   . Microscopic colitis   . Osteoporosis   . Rhinitis   . Pneumonia     hx   Past Surgical History  Procedure Laterality Date  . Knee surgery Right     arthroscopy  . Tubal ligation    . Tonsillectomy    . Bunionectomy Left   . Dilation and curettage of uterus    . Orif wrist fracture Left 12/13/2013    Procedure: OPEN REDUCTION INTERNAL FIXATION (ORIF) WRIST FRACTURE;  Surgeon: Cammy Copa, MD;  Location: [facility] OR;  Service: Orthopedics;  Laterality: Left;   Family History  Problem Relation Age of Onset  . Hypertension Father   . Cancer Sister     rectal   .  Rectal cancer Sister [id number]  . Arrhythmia Mother    History   Social History  . Marital Status: Married    Spouse Name: N/A  . Number of Children: N/A  . Years of Education: N/A   Occupational History  . Not on file.   Social History Main Topics  . Smoking status: Current Some Day Smoker -- 0.50 packs/day for 50 years    Types: Cigarettes  . Smokeless tobacco: Never Used  . Alcohol Use: 0.0 oz/week    0 Standard drinks or equivalent per week     Comment: Drinks 1.25L wine a night  . Drug Use: No  . Sexual Activity: Not on file   Other Topics Concern  . Not on file   Social History Narrative    Review of Systems: Review of Systems    Constitutional: Denies fever, chills, appetite change.  HEENT: Denies eye pain, redness, hearing loss, congestion.  Respiratory: Denies SOB, DOE, or wheezing.  Cardiovascular: Denies chest pain or leg swelling. +palpitations Gastrointestinal: +nausea, abdominal pain, diarrhea, bright red blood in stool. Denies vomiting Genitourinary: +difficulty urinating, incomplete emptying, and urethral spasms.  Musculoskeletal: Denies myalgias, joint swelling, or gait problem.  Skin: Denies rash and wound.  Neurological: Denies dizziness, seizures, syncope, weakness, numbness or headaches.  Psychiatric/Behavioral: Denies suicidal ideation, mood changes, confusion   Physical Exam: Blood pressure 140/82, pulse 69, temperature 98 F (36.7 C), temperature source Oral, resp. rate 16, last menstrual period 05/03/1987, SpO2 97 %. General: Thin female who is cooperative on examination and in mild distress  Head: Normocephalic and atraumatic.  Eyes: EOMI, pupils equal, round, and reactive to light, no injection and anicteric.  Mouth: No erythema or exudates, dry mucus membranes and tongue Neck: Supple, full ROM  Lungs: CTAB, normal respiratory effort, no accessory muscle use, no crackles, and no wheezes. Heart: Regular rate, regular rhythm, no murmur, no gallop, and no rub.  Abdomen: Soft, non-distended, TTP in mid abdomen, bowel sounds hyperactive, involuntary guarding.  Msk: No joint swelling.  Extremities: 2+ DP pulses bilaterally. No edema Neurologic: Alert & oriented X3, cranial nerves II-XII intact, strength normal in all extremities Skin: No rashes.  Psych: Memory intact for recent and remote, normally interactive, good eye contact, mildly anxious   Lab results: Basic Metabolic Panel:  Recent Labs  78/29/5602/25/16 0316  NA 136  K 3.5  CL 101  CO2 24  GLUCOSE 113*  BUN 6  CREATININE 0.50  CALCIUM 9.1   Liver Function Tests:  Recent Labs  06/16/14 0316  AST 35  ALT 15  ALKPHOS 67   BILITOT 0.4  PROT 7.3  ALBUMIN 4.2    Recent Labs  06/16/14 0316  LIPASE 19   CBC:  Recent Labs  06/16/14 0316  WBC 7.9  NEUTROABS 5.1  HGB 12.9  HCT 38.2  MCV 98.2  PLT 305   Urinalysis:  Recent Labs  06/16/14 0346  COLORURINE YELLOW  LABSPEC 1.016  PHURINE 5.0  GLUCOSEU NEGATIVE  HGBUR SMALL*  BILIRUBINUR NEGATIVE  KETONESUR NEGATIVE  PROTEINUR NEGATIVE  UROBILINOGEN 0.2  NITRITE NEGATIVE  LEUKOCYTESUR NEGATIVE   Misc. Labs: C. Diff pending  Imaging results:  Ct Abdomen Pelvis W Contrast  06/16/2014   CLINICAL DATA:  Diarrhea and abdominal pain for 3-4 days. Nausea. Blood in the stool  EXAM: CT ABDOMEN AND PELVIS WITH CONTRAST  TECHNIQUE: Multidetector CT imaging of the abdomen and pelvis was performed using the standard protocol following bolus administration of intravenous contrast.  CONTRAST:  80mL OMNIPAQUE IOHEXOL 300 MG/ML  SOLN  COMPARISON:  CT of the abdomen and pelvis 07/05/2004  FINDINGS: The lung bases are clear without focal nodule, mass, or airspace disease. The heart size is normal. No significant pleural or pericardial effusion is present.  The liver is within normal limits. Spleen is small. The stomach is contracted. No focal lesions are evident. The duodenum is somewhat dilated. Common bile duct and gallbladder are normal. The pancreas is within normal limits.  The adrenal glands are normal bilaterally. A 3 cm benign cyst is noted in the right kidney. The kidneys and ureters are otherwise within normal limits.  The distal colon is mostly collapsed. There is fluid within the transverse colon. The appendix is visualized and normal.  Oral contrast is seen within the distal bowel. Multiple loops of bowel are slightly distended, but less than 3 cm. A distal transition is evident without a mass lesion. The terminal ileum is collapsed.  The uterus an at neck is are within normal limits. No significant adenopathy is present. There is no free fluid. Extensive  atherosclerotic calcifications are present in the aorta without aneurysm.  Urinary bladder is within normal limits. The bone windows are unremarkable.  IMPRESSION: 1. Partial distal small bowel obstruction without a distinct transition or mass lesion. 2. No free fluid or free air. 3. Moderate atherosclerotic disease. 4. Benign appearing cyst in the right kidney.   Electronically Signed   By: Marin Roberts M.D.   On: 06/16/2014 07:41    Other results: EKG: Sinus rhythm. T-wave inversion in V2. Reduced T-wave amplitude copmpared to previous.   Assessment & Plan by Problem: 69yo F w/ PMH benign heart palpitations and tobacco and EtOH abuse presents with diarrhea and cramping abdominal pain.  #Partial small bowel obstruction with diarrhea: Diarrhea and abd pain began on Sunday with multiple episodes of lose stools. CT abd/pelvis shows partial SBO. All labs look good. Pt without anion gap and is still having stools. No h/o abdominal surgery or PPI use. She denies sick contacts but has recently used antibiotics. In speaking to her PCP office, she was treated with Levaquin on 05/03/14 x 10days for a sinus infection and conjunctivitis. I am concerned that she may have C.diff and has been taking antidiarrheals, which have resulted in this partial SBO. She continues to have diarrhea despite continuing to take the antidiarrheals.  - Admit to IMTS to med-surg - IVF - Checking C.diff - Enteric precautions - NPO, no emesis so holding on NGT - General Surgery consulted by ED, appreciate recommendations - Tylenol for pain, trying to avoid narcotics in the setting of partial SBO  #Substance abuse: Per pt, she drinks a 5L box of wine in 4 days. She seems somewhat anxious in the ED and has a strong possibility for withdrawal. She is also a cigarette smoker, smoking 1/2 ppd x50 ys, and may need nicotine patch. - CIWA protocol - Banana bag x1L - CSW for cessation counseling for EtOH and tobacco  #BRBPR: Seen  today in th toilet after multiple episodes of diarrhea. Pt endorses hemorrhoids, which are the likely source. Last colonoscopy was 2013 and was normal. Hgb stable.  - CBC in am  #Palpitations: Pt with h/o benign palpitations for which she has seen Dr. Melburn Popper for in the past, in 2012. He felt that she was having PAC vs PVCs possibly 2/2 nicotine, excessive caffeine, fatigue, or stress. Her did not feel that she was having significant arrhythmias that would require ECHO or  monitoring. She was to f/u in 1 year, but there is no documentation of that visit, and I do not see that an appt was scheduled. She states that she does still have intermittent palpitations. EKG with sinus rhythm, no PAC or PVCs, pt denies chest pain or SOB.  - F/u with Cardiology as needed as an outpatient.  #Urethral spasm: Pt states that she as started on Flomax on 2/23 for urethral spasms. She was found to have Hgb in her urine at her PCP's office, which, per pt, was attributed to possible kidney stone that could have been passed. UA today with small Hgb, negative for bilirubin.  - Continue Flomax - Will contact PCP for records - Consider Urology f/u as an outpatient  #DVT PPx: [place] Heparin   Dispo: Disposition is deferred at this time, awaiting improvement of current medical problems. Anticipated discharge in approximately 1-3 day(s).   The patient does have a current PCP Marva Panda, NP) and does not need an [facility] hospital follow-up appointment after discharge.  The patient does not have transportation limitations that hinder transportation to clinic appointments.  Signed: Genelle Gather, MD 06/16/2014, 9:27 AM

## 2014-06-17 ENCOUNTER — Encounter (HOSPITAL_COMMUNITY): Payer: Self-pay | Admitting: Physician Assistant

## 2014-06-17 ENCOUNTER — Inpatient Hospital Stay (HOSPITAL_COMMUNITY): Payer: Medicare Other

## 2014-06-17 DIAGNOSIS — K5669 Other intestinal obstruction: Secondary | ICD-10-CM

## 2014-06-17 DIAGNOSIS — R197 Diarrhea, unspecified: Secondary | ICD-10-CM

## 2014-06-17 LAB — CBC
HCT: 33.7 % — ABNORMAL LOW (ref 36.0–46.0)
Hemoglobin: 11 g/dL — ABNORMAL LOW (ref 12.0–15.0)
MCH: 32.9 pg (ref 26.0–34.0)
MCHC: 32.6 g/dL (ref 30.0–36.0)
MCV: 100.9 fL — AB (ref 78.0–100.0)
Platelets: 261 10*3/uL (ref 150–400)
RBC: 3.34 MIL/uL — ABNORMAL LOW (ref 3.87–5.11)
RDW: 13.7 % (ref 11.5–15.5)
WBC: 8 10*3/uL (ref 4.0–10.5)

## 2014-06-17 LAB — COMPREHENSIVE METABOLIC PANEL
ALBUMIN: 3.3 g/dL — AB (ref 3.5–5.2)
ALT: 12 U/L (ref 0–35)
AST: 26 U/L (ref 0–37)
Alkaline Phosphatase: 53 U/L (ref 39–117)
Anion gap: 16 — ABNORMAL HIGH (ref 5–15)
BUN: 6 mg/dL (ref 6–23)
CALCIUM: 8.5 mg/dL (ref 8.4–10.5)
CO2: 17 mmol/L — ABNORMAL LOW (ref 19–32)
Chloride: 105 mmol/L (ref 96–112)
Creatinine, Ser: 0.64 mg/dL (ref 0.50–1.10)
GFR calc Af Amer: >90 mL/min (ref >90)
GFR calc non Af Amer: 89 mL/min — ABNORMAL LOW (ref >90)
GLUCOSE: 50 mg/dL — AB (ref 70–99)
POTASSIUM: 3.9 mmol/L (ref 3.5–5.1)
SODIUM: 138 mmol/L (ref 135–145)
TOTAL PROTEIN: 5.9 g/dL — AB (ref 6.0–8.3)
Total Bilirubin: 1.2 mg/dL (ref 0.3–1.2)

## 2014-06-17 LAB — GLUCOSE, CAPILLARY
GLUCOSE-CAPILLARY: 201 mg/dL — AB (ref 70–99)
Glucose-Capillary: 170 mg/dL — ABNORMAL HIGH (ref 70–99)

## 2014-06-17 MED ORDER — DEXTROSE-NACL 5-0.45 % IV SOLN
INTRAVENOUS | Status: DC
Start: 2014-06-17 — End: 2014-06-18
  Administered 2014-06-17 (×2): via INTRAVENOUS

## 2014-06-17 MED ORDER — DEXTROSE 50 % IV SOLN
0.5000 | Freq: Once | INTRAVENOUS | Status: AC
  Administered 2014-06-17: 25 mL via INTRAVENOUS
  Filled 2014-06-17: qty 50

## 2014-06-17 MED ORDER — PROMETHAZINE HCL 25 MG/ML IJ SOLN
12.5000 mg | Freq: Three times a day (TID) | INTRAMUSCULAR | Status: DC | PRN
  Administered 2014-06-17: 12.5 mg via INTRAVENOUS
  Filled 2014-06-17: qty 1

## 2014-06-17 NOTE — Progress Notes (Signed)
Internal Medicine Attending  Date: 06/17/2014  Patient name: [name] Medical record number: [id number] Date of birth: 04/07/1946 Age: 69 y.o. Gender: female  I saw and evaluated the patient. I discussed patient and reviewed the resident's note by Dr. Valentino Saxonothman, and I agree with the resident's findings and plans as documented in his note, with the following additional comments.  Dr. Russella DarStark obtained a different account of patient's wine intake from that obtained by our admitting resident, so I discussed this with patient this afternoon.  She affirmed that she drinks a 5 L box of wine every 4 days.  I discussed with her the advisability of seeking treatment, and I agree with plan for social work consult to provide information on available resources.

## 2014-06-17 NOTE — Progress Notes (Signed)
[facility] Progress Note     Subjective: Pt says she feels nauseated, but is hungry and thirsty.  She describes pain in her abdomen over her bladder.  She said the I&O cath was very painful yesterday.  No more bowel movements.  Describes burning with urination.  Having flatus.  Mobilizing some OOB.  Objective: Vital signs in last 24 hours: Temp:  [98.2 F (36.8 C)-98.6 F (37 C)] 98.6 F (37 C) (02/26 0537) Pulse Rate:  [63-79] 79 (02/26 0537) Resp:  [16-18] 18 (02/26 0537) BP: (98-141)/(56-83) 98/56 mmHg (02/26 0537) SpO2:  [91 %-99 %] 99 % (02/26 0537) Weight:  [100 lb (45.36 kg)] 100 lb (45.36 kg) (02/25 1600) Last BM Date: 06/16/14  Intake/Output from previous day: 02/25 0701 - 02/26 0700 In: -  Out: 580 [Urine:580] Intake/Output this shift:    PE: Gen:  Alert, NAD, pleasant Card:  RRR, no M/G/R heard Pulm:  CTA, no W/R/R Abd: Soft, minimally tender, ND, +BS, no HSM, no abdominal scars noted Ext:  No erythema, edema, or tenderness   Lab Results:   Recent Labs  06/16/14 0316 06/17/14 0500  WBC 7.9 8.0  HGB 12.9 11.0*  HCT 38.2 33.7*  PLT 305 261   BMET  Recent Labs  06/16/14 0316 06/17/14 0500  NA 136 138  K 3.5 3.9  CL 101 105  CO2 24 17*  GLUCOSE 113* 50*  BUN 6 6  CREATININE 0.50 0.64  CALCIUM 9.1 8.5   PT/INR No results for input(s): LABPROT, INR in the last 72 hours. CMP     Component Value Date/Time   NA 138 06/17/2014 0500   K 3.9 06/17/2014 0500   CL 105 06/17/2014 0500   CO2 17* 06/17/2014 0500   GLUCOSE 50* 06/17/2014 0500   BUN 6 06/17/2014 0500   CREATININE 0.64 06/17/2014 0500   CALCIUM 8.5 06/17/2014 0500   PROT 5.9* 06/17/2014 0500   ALBUMIN 3.3* 06/17/2014 0500   AST 26 06/17/2014 0500   ALT 12 06/17/2014 0500   ALKPHOS 53 06/17/2014 0500   BILITOT 1.2 06/17/2014 0500   GFRNONAA 89* 06/17/2014 0500   GFRAA >90 06/17/2014 0500   Lipase     Component Value Date/Time   LIPASE 19 06/16/2014 0316        Studies/Results: Ct Abdomen Pelvis W Contrast  06/16/2014   CLINICAL DATA:  Diarrhea and abdominal pain for 3-4 days. Nausea. Blood in the stool  EXAM: CT ABDOMEN AND PELVIS WITH CONTRAST  TECHNIQUE: Multidetector CT imaging of the abdomen and pelvis was performed using the standard protocol following bolus administration of intravenous contrast.  CONTRAST:  80mL OMNIPAQUE IOHEXOL 300 MG/ML  SOLN  COMPARISON:  CT of the abdomen and pelvis 07/05/2004  FINDINGS: The lung bases are clear without focal nodule, mass, or airspace disease. The heart size is normal. No significant pleural or pericardial effusion is present.  The liver is within normal limits. Spleen is small. The stomach is contracted. No focal lesions are evident. The duodenum is somewhat dilated. Common bile duct and gallbladder are normal. The pancreas is within normal limits.  The adrenal glands are normal bilaterally. A 3 cm benign cyst is noted in the right kidney. The kidneys and ureters are otherwise within normal limits.  The distal colon is mostly collapsed. There is fluid within the transverse colon. The appendix is visualized and normal.  Oral contrast is seen within the distal bowel. Multiple loops of bowel are slightly distended, but less than  3 cm. A distal transition is evident without a mass lesion. The terminal ileum is collapsed.  The uterus an at neck is are within normal limits. No significant adenopathy is present. There is no free fluid. Extensive atherosclerotic calcifications are present in the aorta without aneurysm.  Urinary bladder is within normal limits. The bone windows are unremarkable.  IMPRESSION: 1. Partial distal small bowel obstruction without a distinct transition or mass lesion. 2. No free fluid or free air. 3. Moderate atherosclerotic disease. 4. Benign appearing cyst in the right kidney.   Electronically Signed   By: Marin Robertshristopher  Mattern M.D.   On: 06/16/2014 07:41     Anti-infectives: Anti-infectives    None       Assessment/Plan Abdominal pain  -Was concern for pSBO, but clinically not obstructed -IVF, pain control, antiemetics -Recheck KUB -Allow sips of clears if tolerating -Ambulate and IS Dysphagia -recommend GI consult for dysphagia Diarrhea -c.diff negative Dysuria -per primary H/o tubal ligation DVT proph  -heparin    LOS: 1 day    DORT, Amelita Risinger 06/17/2014, 7:52 AM Pager: [phone]

## 2014-06-17 NOTE — Progress Notes (Signed)
Subjective: Kristina Dennis says she has not had a bowel movement since yesterday at 2 pm. She is still passing gas She says she no longer has abdominal pain. Her only pain now is what she describes as urethral spasms that feel like pinching. She is frustrated at her NPO status. She also clarified that she has the sensation halfway through a meal that food is stuck in her esophagus and she cannot continue eating.  Objective: Vital signs in last 24 hours: Filed Vitals:   06/16/14 1130 06/16/14 1600 06/16/14 2110 06/17/14 0537  BP: 110/60  141/68 98/56  Pulse: 68  71 79  Temp:   98.2 F (36.8 C) 98.6 F (37 C)  TempSrc:   Oral Oral  Resp:   16 18  Height:   (1.676 m)    Weight:  100 lb (45.36 kg)    SpO2: 95%  97% 99%   Weight change:   Intake/Output Summary (Last 24 hours) at 06/17/14 1101 Last data filed at 06/17/14 1013  Gross per 24 hour  Intake      0 ml  Output    980 ml  Net   -980 ml   Gen: A&O x 4, no acute distress, well developed, well nourished HEENT: Atraumatic, PERRL, EOMI, sclerae anicteric, moist mucous membranes Heart: Regular rate and rhythm, normal S1 S2, no murmurs, rubs, or gallops Lungs: Clear to auscultation bilaterally, respirations unlabored Abd: Soft, non-tender, minimal suprapubic distention, + bowel sounds, no hepatosplenomegaly Ext: No edema or cyanosis  Lab Results: Basic Metabolic Panel:  Recent Labs Lab 06/16/14 0316 06/17/14 0500  NA 136 138  K 3.5 3.9  CL 101 105  CO2 24 17*  GLUCOSE 113* 50*  BUN 6 6  CREATININE 0.50 0.64  CALCIUM 9.1 8.5   Liver Function Tests:  Recent Labs Lab 06/16/14 0316 06/17/14 0500  AST 35 26  ALT 15 12  ALKPHOS 67 53  BILITOT 0.4 1.2  PROT 7.3 5.9*  ALBUMIN 4.2 3.3*    Recent Labs Lab 06/16/14 0316  LIPASE 19  CBC:  Recent Labs Lab 06/16/14 0316 06/17/14 0500  WBC 7.9 8.0  NEUTROABS 5.1  --   HGB 12.9 11.0*  HCT 38.2 33.7*  MCV 98.2 100.9*  PLT 305 261   Urinalysis:  Recent  Labs Lab 06/16/14 0346  COLORURINE YELLOW  LABSPEC 1.016  PHURINE 5.0  GLUCOSEU NEGATIVE  HGBUR SMALL*  BILIRUBINUR NEGATIVE  KETONESUR NEGATIVE  PROTEINUR NEGATIVE  UROBILINOGEN 0.2  NITRITE NEGATIVE  LEUKOCYTESUR NEGATIVE   C diff neg  Micro Results: Recent Results (from the past 240 hour(s))  Clostridium Difficile by PCR     Status: None   Collection Time: 06/16/14  9:54 AM  Result Value Ref Range Status   C difficile by pcr NEGATIVE NEGATIVE Final   Studies/Results: Ct Abdomen Pelvis W Contrast  06/16/2014   CLINICAL DATA:  Diarrhea and abdominal pain for 3-4 days. Nausea. Blood in the stool  EXAM: CT ABDOMEN AND PELVIS WITH CONTRAST  TECHNIQUE: Multidetector CT imaging of the abdomen and pelvis was performed using the standard protocol following bolus administration of intravenous contrast.  CONTRAST:  80mL OMNIPAQUE IOHEXOL 300 MG/ML  SOLN  COMPARISON:  CT of the abdomen and pelvis 07/05/2004  FINDINGS: The lung bases are clear without focal nodule, mass, or airspace disease. The heart size is normal. No significant pleural or pericardial effusion is present.  The liver is within normal limits. Spleen is small. The stomach is  contracted. No focal lesions are evident. The duodenum is somewhat dilated. Common bile duct and gallbladder are normal. The pancreas is within normal limits.  The adrenal glands are normal bilaterally. A 3 cm benign cyst is noted in the right kidney. The kidneys and ureters are otherwise within normal limits.  The distal colon is mostly collapsed. There is fluid within the transverse colon. The appendix is visualized and normal.  Oral contrast is seen within the distal bowel. Multiple loops of bowel are slightly distended, but less than 3 cm. A distal transition is evident without a mass lesion. The terminal ileum is collapsed.  The uterus an at neck is are within normal limits. No significant adenopathy is present. There is no free fluid. Extensive  atherosclerotic calcifications are present in the aorta without aneurysm.  Urinary bladder is within normal limits. The bone windows are unremarkable.  IMPRESSION: 1. Partial distal small bowel obstruction without a distinct transition or mass lesion. 2. No free fluid or free air. 3. Moderate atherosclerotic disease. 4. Benign appearing cyst in the right kidney.   Electronically Signed   By: Marin Robertshristopher  Mattern M.D.   On: 06/16/2014 07:41   Medications: I have reviewed the patient's current medications. Scheduled Meds: . dextrose  0.5 ampule Intravenous Once  . folic acid  1 mg Oral Daily  . heparin  5,000 Units Subcutaneous 3 times per day  . multivitamin with minerals  1 tablet Oral Daily  . ondansetron (ZOFRAN) IV  4 mg Intravenous Once  . tamsulosin  0.4 mg Oral Daily  . thiamine  100 mg Oral Daily   Or  . thiamine  100 mg Intravenous Daily   Continuous Infusions: . dextrose 5 % and 0.45% NaCl     PRN Meds:.acetaminophen **OR** acetaminophen, LORazepam **OR** LORazepam, promethazine Assessment/Plan: Principal Problem:   Partial small bowel obstruction Active Problems:   Palpitations   Diarrhea   Urethral spasm  #Partial small bowel obstruction:  She says she has not had a bowel movement since yesterday afternoon. CT abd/pelvis shows partial SBO. Prior surgical history of tubal ligation. Her labs continue to be unremarkable. She has stool culture and lactoferrin pending but c diff negative (had levaquin x 10 days 05/03/14 for sinus infection). This may have been a viral illness that has passed. She denies sick contacts but has recently used antibiotics. This morning she had blood glucose 50 so giving 1/2 amp D50 and IVF to include dextrose - appreciate surgery - GI consult - NPO for now -1/2 amp D50 - D5 1/2 NS @ 125 cc/hr - 2 view abd film - Tylenol for pain, trying to avoid narcotics in the setting of partial SBO  #Dysphagia: Kristina Dennis notes that she has difficulty completing  meals because she feels like food is stuck in her esophagus. She has been seen by Dr Leone PayorGessner in the past of [facility] and asked if an associate could see her while here. She had an out-patient appointment scheduled for this issue but is now hospitalized -GI consult  #Urethral spasm: Pt states that she as started on Flomax on 2/23 for urethral spasms. She was found to have Hgb in her urine at her PCP's office, which, per pt, was attributed to possible kidney stone that could have been passed. UA today with small Hgb, negative for bilirubin.  - Continue Flomax - Consider Urology f/u as an outpatient  #Substance abuse: Per pt, she drinks a 5L box of wine in 4 days. She seems somewhat anxious  in the ED and has a strong possibility for withdrawal. She is also a cigarette smoker, smoking 1/2 ppd x50 ys, and may need nicotine patch. - CIWA protocol - Banana bag x1L - CSW for cessation counseling for EtOH and tobacco  #BRBPR: Seen today in th toilet after multiple episodes of diarrhea. Pt endorses hemorrhoids, which are the likely source. Last colonoscopy was 2013 and was normal. Hgb down this morning at 11 down from 12.9 on presentation.  -cont to monitor -GI consult  #Palpitations: Pt with h/o benign palpitations for which she has seen Dr. Melburn Popper for in the past, in 2012. He felt that she was having PAC vs PVCs possibly 2/2 nicotine, excessive caffeine, fatigue, or stress. Her did not feel that she was having significant arrhythmias that would require ECHO or monitoring. She was to f/u in 1 year, but there is no documentation of that visit, and I do not see that an appt was scheduled. She states that she does still have intermittent palpitations. EKG with sinus rhythm, no PAC or PVCs, pt denies chest pain or SOB.  - F/u with Cardiology as needed as an outpatient.  Dispo: Disposition is deferred at this time, awaiting improvement of current medical problems.  Anticipated discharge in approximately 1-2  day(s).   The patient does have a current PCP Marva Panda, NP) and does need an [facility] hospital follow-up appointment after discharge.  The patient does not have transportation limitations that hinder transportation to clinic appointments.  .Services Needed at time of discharge: Y = Yes, Blank = No PT:   OT:   RN:   Equipment:   Other:     LOS: 1 day   Lorenda Hatchet, MD 06/17/2014, 11:01 AM

## 2014-06-17 NOTE — Consult Note (Signed)
[facility] Consult: 11:05 AM 06/17/2014  LOS: 1 day    Referring Provider: Izora Ribas  Primary Care Physician:  Egbert Garibaldi, NP Primary Gastroenterologist:  Dr. Leone Payor    Reason for Consultation:  Dysphagia and PSBO   HPI: Kristina Dennis is a 70 y.o. female.  PMH fairly benign, listed below. S/p tubal ligation. Had colonoscopy/EGD in 2003.  Procedure reports not found but biopsies with non-specific colitis and SB biopsies showed no villous atrophy. Screening colonoscopy 04/2011 by Dr Leone Payor, for hx sister with rectal cancer, was entirely normal.    Admitted yesterday. Since Saturday, 6 days ago, having pain in mid/lower abdomen and on left. Up to 12 watery, greenish stools daily.  Saw some minor bright red blood once or twice as hemorrhoids bulged out.  Normally has 1 to 3 formed BMs per day and rarely sees blood with wiping from "hemorrhoids".  Queasy but vomited only yesterday: non-bloody. Seen at PMD/urgent care 2/21.  As was having sense of incomplete urination was Rxd Flomax.  Pain ebbed and flowed but much better by ROV of 2/23.  Then 2/24: pain severe, vomited.  Came to ED late last night.  Had not eaten out, eats home prepared meals.  CT scan shows distal PSBO, no transition point or mass lesions. + vascular atherosclerosis.  Labs fairly benign with chemistries, WBCs, Hgb normal ininitially, 11 after fluids. MCV a bit elevated at 100. C diff negative.  Urine normal.  Gen surgery, Dr Lindie Spruce, felt she was not obstructed and favoring medical mgt and no rec to place NGT.   The nausea is gone, diarrhea slowed down, last BM was 2PM yesterday.  Ready to try PO.  In addition pt c/o dysphagia for 3 months.  To solids that hang in region of upper esophagus.  Never regurgitates.  No weight loss.  Never had  esophageal stricture or dilation.  Note discrepancy in pt's recorded wine intake.  Admission note says 5L box of wine every 4 days.  She told me: one 750 ml bottle of wine every 4 days.     Past Medical History  Diagnosis Date  . Arthritis   . Palpitations   . Osteoporosis   . Rhinitis   . Pneumonia     hx  . Colitis 2003    per path report from 2003: non-specific.  normal colonoscopy in 04/2011.     Past Surgical History  Procedure Laterality Date  . Knee surgery Right     arthroscopy  . Tubal ligation    . Tonsillectomy    . Bunionectomy Left   . Dilation and curettage of uterus    . Orif wrist fracture Left 12/13/2013    Procedure: OPEN REDUCTION INTERNAL FIXATION (ORIF) WRIST FRACTURE;  Surgeon: Cammy Copa, MD;  Location: [facility] OR;  Service: Orthopedics;  Laterality: Left;    Prior to Admission medications   Medication Sig Start Date End Date Taking? Authorizing Provider  tamsulosin (FLOMAX) 0.4 MG CAPS capsule Take 1 capsule by mouth daily. 06/12/14  Yes Historical Provider, MD  ondansetron (ZOFRAN) 4 MG tablet Take 1 tablet (4 mg total) by mouth every 8 (eight) hours as needed for nausea or vomiting. Patient not taking: Reported on 06/16/2014 12/13/13   Cammy Copa, MD  oxyCODONE-acetaminophen (ROXICET) 5-325 MG per tablet Take 1-2 tablets by mouth every 6 (six) hours as needed for severe pain. Patient not taking: Reported on 06/16/2014 12/13/13   Cammy Copa, MD    Scheduled Meds: . dextrose  0.5 ampule Intravenous Once  . folic acid  1 mg Oral Daily  . heparin  5,000 Units Subcutaneous 3 times per day  . multivitamin with minerals  1 tablet Oral Daily  . ondansetron (ZOFRAN) IV  4 mg Intravenous Once  . tamsulosin  0.4 mg Oral Daily  . thiamine  100 mg Oral Daily   Or  . thiamine  100 mg Intravenous Daily   Infusions: . dextrose 5 % and 0.45% NaCl     PRN Meds: acetaminophen **OR** acetaminophen, LORazepam **OR** LORazepam,  promethazine   Allergies as of 06/16/2014 - Review Complete 06/16/2014  Allergen Reaction Noted  . Hydrocodone Nausea And Vomiting 12/08/2013  . Niacin and related Nausea And Vomiting 04/02/2011  . Sulfa antibiotics Nausea And Vomiting 12/13/2013  . Latex Rash 12/08/2013    Family History  Problem Relation Age of Onset  . Hypertension Father   . Cancer Sister     rectal   . Rectal cancer Sister 70  . Arrhythmia Mother     History   Social History  . Marital Status: Married    Spouse Name: N/A  . Number of Children: N/A  . Years of Education: N/A   Occupational History  . Not on file.   Social History Main Topics  . Smoking status: Current Some Day Smoker -- 0.50 packs/day for 50 years    Types: Cigarettes  . Smokeless tobacco: Never Used  . Alcohol Use: 0.0 oz/week    0 Standard drinks or equivalent per week     Comment: Drinks 1.25L wine a night  . Drug Use: No  . Sexual Activity: Not on file   Other Topics Concern  . Not on file   Social History Narrative    REVIEW OF SYSTEMS: Constitutional:  No weight loss.  Generally no weakness.  Does not exercise. ENT:  No nose bleeds Pulm:  No DOB or cough CV:  No palpitations, no LE edema.  GU:  No hematuria, no frequency GI:  Per HPI Heme:  No recall of B12 or folate deficiency.     Transfusions:  none Neuro:  No headaches, no peripheral tingling or numbness Derm:  No itching, no rash or sores.  Endocrine:  No sweats or chills.  No polyuria or dysuria Immunization:  Not queried.  Travel:  None beyond local counties in last few months.    PHYSICAL EXAM: Vital signs in last 24 hours: Filed Vitals:   06/17/14 0537  BP: 98/56  Pulse: 79  Temp: 98.6 F (37 C)  Resp: 18   Wt Readings from Last 3 Encounters:  06/16/14 100 lb (45.36 kg)  12/13/13 100 lb (45.36 kg)  05/17/11 105 lb (47.628 kg)   General: thin, slightly anxious, not ill looking Head:  No swelling or asymmetry  Eyes:  No icterus or  pallor.  EOMI Ears:  Not HOH  Nose:  No congestion or discharge Mouth:  Clear, moist oral mm.  Good teeth Neck:  No JVD, no TMG, no masses Lungs:  Clear bil.  Breathing not labored, no cough Heart: RRR.  No mrg Abdomen:  Soft, NT, ND.  No mass or HSM.  No bruits.   Rectal: no stool, no palpable rrhoids, no blood   Musc/Skeltl: no joint pain or swelling.  No contracture deformity.  Extremities:  No CCE  Neurologic:  Oriented x 3. No tremor, no asterixis Skin:  No rash, no telangectasia, no sores Tattoos:  none Nodes:  No cervical or inguinal adenopathy   Psych:  Cooperative. Not agitated or depressed.  Seemed a bit annoyed by info gathering questions.   Intake/Output from previous day: 02/25 0701 - 02/26 0700 In: -  Out: 580 [Urine:580] Intake/Output this shift: Total I/O In: -  Out: 400 [Urine:400]  LAB RESULTS:  Recent Labs  06/16/14 0316 06/17/14 0500  WBC 7.9 8.0  HGB 12.9 11.0*  HCT 38.2 33.7*  PLT 305 261   BMET Lab Results  Component Value Date   NA 138 06/17/2014   NA 136 06/16/2014   NA 135* 12/08/2013   K 3.9 06/17/2014   K 3.5 06/16/2014   K 4.3 12/08/2013   CL 105 06/17/2014   CL 101 06/16/2014   CL 96 12/08/2013   CO2 17* 06/17/2014   CO2 24 06/16/2014   CO2 22 12/08/2013   GLUCOSE 50* 06/17/2014   GLUCOSE 113* 06/16/2014   GLUCOSE 108* 12/08/2013   BUN 6 06/17/2014   BUN 6 06/16/2014   BUN 7 12/08/2013   CREATININE 0.64 06/17/2014   CREATININE 0.50 06/16/2014   CREATININE 0.43* 12/08/2013   CALCIUM 8.5 06/17/2014   CALCIUM 9.1 06/16/2014   CALCIUM 9.8 12/08/2013   LFT  Recent Labs  06/16/14 0316 06/17/14 0500  PROT 7.3 5.9*  ALBUMIN 4.2 3.3*  AST 35 26  ALT 15 12  ALKPHOS 67 53  BILITOT 0.4 1.2    C-Diff No components found for: CDIFF   Lipase     Component Value Date/Time   LIPASE 19 06/16/2014 0316    Drugs of Abuse  No results found for: LABOPIA, COCAINSCRNUR, LABBENZ, AMPHETMU, THCU, LABBARB   RADIOLOGY  STUDIES: Ct Abdomen Pelvis W Contrast  06/16/2014   CLINICAL DATA:  Diarrhea and abdominal pain for 3-4 days. Nausea. Blood in the stool  EXAM: CT ABDOMEN AND PELVIS WITH CONTRAST  TECHNIQUE: Multidetector CT imaging of the abdomen and pelvis was performed using the standard protocol following bolus administration of intravenous contrast.  CONTRAST:  80mL OMNIPAQUE IOHEXOL 300 MG/ML  SOLN  COMPARISON:  CT of the abdomen and pelvis 07/05/2004  FINDINGS: The lung bases are clear without focal nodule, mass, or airspace disease. The heart size is normal. No significant pleural or pericardial effusion is present.  The liver is within normal limits. Spleen is small. The stomach is contracted. No focal lesions are evident. The duodenum is somewhat dilated. Common bile duct and gallbladder are normal. The pancreas is within normal limits.  The adrenal glands are normal bilaterally. A 3 cm benign cyst is noted in the right kidney. The kidneys and ureters are otherwise within normal limits.  The distal colon is mostly collapsed. There is fluid within the transverse colon. The appendix is visualized and normal.  Oral contrast is seen within the distal bowel. Multiple loops of bowel are slightly distended, but less than 3 cm. A distal transition is evident without a mass lesion. The terminal ileum is collapsed.  The uterus an at neck is are within normal limits. No significant adenopathy is present. There is  no free fluid. Extensive atherosclerotic calcifications are present in the aorta without aneurysm.  Urinary bladder is within normal limits. The bone windows are unremarkable.  IMPRESSION: 1. Partial distal small bowel obstruction without a distinct transition or mass lesion. 2. No free fluid or free air. 3. Moderate atherosclerotic disease. 4. Benign appearing cyst in the right kidney.   Electronically Signed   By: Marin Roberts M.D.   On: 06/16/2014 07:41    ENDOSCOPIC STUDIES: 04/2011  Colonoscopy  Dr  Leone Payor.  For sister's hx rectal cancer Normal study to Cecum.   02/2002 Colonoscopy and EGD: Unable to pull report but found surg path.   Path:  1. COLON, BIOPSY: ACTIVE COLITIS, SEE COMMENT benign colonic mucosa in which there is moderate lymphoplasmacytic infiltrate within the lamina propria associated with neutrophils. There is cryptitis and the neutrophils infiltrate the surface epithelium consistent with that seen in active colitis. No basal plasmacytosis, architectural distortion, or granulomas are appreciated. The findings are those of an active colitis and the differential diagnosis includes an acute self limiting colitis (favored), changes secondary to the use of nonsteroidal anti-inflammatory drugs, Crohn' s disease, or early ischemic colitis  2. SMALL BOWEL BIOPSY: BENIGN SMALL BOWEL MUCOSA. NO VILLOUS ATROPHY, INFLAMMATION OR OTHER ABNORMALITIES PRESENT.   IMPRESSION:    *  PSBO?Marland Kitchen  Suspect acute gastroenteritis.  But pt does have vascular disease on CT and the pain had been mid and left sided which could mean ischemic colitis, however no colitis seen on CT.  In any case, sxs better: no stools for 22 hours (so does not need the stool clx which I cancelled).  Last narcotic was early yesterday.   *  Dysphagia. To Solids.  Pt prefers outpt follow up of this. Will arrange ROV with GI in next few weeks.   *  Screening colonoscopy 04/2011: normal Path from colonoscopy of 2003: non-specific colitis. Again: no evidence of colitis on current CT.  There is not hx of microscopic colitis that I uncovered, thus I corrected the hx listing.    *  ? etoh abuse.  Need to verify again exact consumption volume of wine.  Pt's husband may be the better person to ask. Her LFTs and CT appearance of liver are normal.    *  Sense of incomplete voiding.  Temporary foley placed (very uncomfortable per pt report): did not have significant post void residual.  On Flomax.     PLAN:      *  Per Dr Russella Dar and above.  Started clears and can go to soft diet if clears tolerated.      Jennye Moccasin  06/17/2014, 11:05 AM Pager: [phone]      Attending physician's note   I have taken a history, examined the patient and reviewed the chart. I agree with the Advanced Practitioner's note, impression and recommendations. Self limited acute gastroenteritis with mild ileus or mild partial SBO which is resolving. Advance diet as tolerated. Further evaluation if GI symptoms fail to resolve. Outpatient evaluation of dysphagia with Dr. Leone Payor.   Meryl Dare, MD [facility]

## 2014-06-18 DIAGNOSIS — F1010 Alcohol abuse, uncomplicated: Secondary | ICD-10-CM | POA: Insufficient documentation

## 2014-06-18 LAB — GLUCOSE, CAPILLARY
GLUCOSE-CAPILLARY: 132 mg/dL — AB (ref 70–99)
Glucose-Capillary: 110 mg/dL — ABNORMAL HIGH (ref 70–99)
Glucose-Capillary: 128 mg/dL — ABNORMAL HIGH (ref 70–99)

## 2014-06-18 LAB — BASIC METABOLIC PANEL
ANION GAP: 8 (ref 5–15)
CO2: 18 mmol/L — AB (ref 19–32)
Calcium: 8.4 mg/dL (ref 8.4–10.5)
Chloride: 108 mmol/L (ref 96–112)
Creatinine, Ser: 0.39 mg/dL — ABNORMAL LOW (ref 0.50–1.10)
GFR calc Af Amer: >90 mL/min (ref >90)
GFR calc non Af Amer: >90 mL/min (ref >90)
Glucose, Bld: 108 mg/dL — ABNORMAL HIGH (ref 70–99)
Potassium: 3.3 mmol/L — ABNORMAL LOW (ref 3.5–5.1)
SODIUM: 134 mmol/L — AB (ref 135–145)

## 2014-06-18 LAB — CBC
HEMATOCRIT: 32.2 % — AB (ref 36.0–46.0)
Hemoglobin: 10.7 g/dL — ABNORMAL LOW (ref 12.0–15.0)
MCH: 33 pg (ref 26.0–34.0)
MCHC: 33.2 g/dL (ref 30.0–36.0)
MCV: 99.4 fL (ref 78.0–100.0)
PLATELETS: 233 10*3/uL (ref 150–400)
RBC: 3.24 MIL/uL — ABNORMAL LOW (ref 3.87–5.11)
RDW: 13.4 % (ref 11.5–15.5)
WBC: 6.1 10*3/uL (ref 4.0–10.5)

## 2014-06-18 MED ORDER — POTASSIUM CHLORIDE CRYS ER 20 MEQ PO TBCR
40.0000 meq | EXTENDED_RELEASE_TABLET | Freq: Once | ORAL | Status: AC
Start: 2014-06-18 — End: 2014-06-18
  Administered 2014-06-18: 40 meq via ORAL
  Filled 2014-06-18: qty 2

## 2014-06-18 NOTE — Progress Notes (Signed)
Subjective: Kristina Dennis.  [name] was seen and examined today.  She is feeling well and ready for discharge.  Her abdominal pain has resolved.  She was able to eat breakfast and lunch (including part of a chicken sandwich) without any dysphagia.  She had one loose stool today but nothing dark or bloody.  The urethral discomfort is improving.   Objective: Vital signs in last 24 hours: Filed Vitals:   06/17/14 0537 06/17/14 1334 06/18/14 0011 06/18/14 0559  BP: 98/56 121/61 120/61 133/76  Pulse: 79 67 67 64  Temp: 98.6 F (37 C) 97.4 F (36.3 C) 98.2 F (36.8 C) 98.4 F (36.9 C)  TempSrc: Oral Oral Oral Oral  Resp: 18 16 15 16   Height:      Weight:      SpO2: 99% 100% 99% 99%   Weight change:   Intake/Output Summary (Last 24 hours) at 06/18/14 1558 Last data filed at 06/18/14 0500  Gross per 24 hour  Intake 1248.33 ml  Output      0 ml  Net 1248.33 ml   Gen: no acute distress, well developed, well nourished, sitting up in bed HEENT: [place]/AT, MMM Heart: Regular rate and rhythm, normal S1 S2, no murmurs, rubs, or gallops Lungs: Clear to auscultation bilaterally, respirations unlabored Abd: Soft, non-tender, non-distended, +BS all four quadrants, no rigidity or guarding Ext: No edema or cyanosis Neuro:  Strength intact, she is able to move extremities independently, following commands and responding appropriately  Lab Results: Basic Metabolic Panel:  Recent Labs Lab 06/17/14 0500 06/18/14 0500  NA 138 134*  K 3.9 3.3*  CL 105 108  CO2 17* 18*  GLUCOSE 50* 108*  BUN 6 <5*  CREATININE 0.64 0.39*  CALCIUM 8.5 8.4   CBC:  Recent Labs Lab 06/16/14 0316 06/17/14 0500 06/18/14 0500  WBC 7.9 8.0 6.1  NEUTROABS 5.1  --   --   HGB 12.9 11.0* 10.7*  HCT 38.2 33.7* 32.2*  MCV 98.2 100.9* 99.4  PLT 305 261 233   Medications: I have reviewed the patient's current medications. Scheduled Meds: . folic acid  1 mg Oral Daily  . heparin  5,000 Units Subcutaneous 3 times  per day  . multivitamin with minerals  1 tablet Oral Daily  . ondansetron (ZOFRAN) IV  4 mg Intravenous Once  . potassium chloride  40 mEq Oral Once  . tamsulosin  0.4 mg Oral Daily  . thiamine  100 mg Oral Daily   Continuous Infusions: . dextrose 5 % and 0.45% NaCl 75 mL/hr at 06/17/14 2300   PRN Meds:.acetaminophen **OR** acetaminophen, LORazepam **OR** LORazepam, promethazine   Assessment/Plan: 69 year old F without significant PMH here with abdominal pain and diarrhea x four days found to have partial SBO on imaging.   Likely acute viral gastroenteritis w/ partial SBO on imaging:  Short duration of illness that has responded to bowel rest and slow re-initiation of diet.  She looks well today, abdominal exam is benign, she has had only one BM and is tolerating a soft diet.  She feels ready to go home and she will have close follow-up to be arranged next week with her PCP. - d/c to home - patient advised to advance diet slowly - she was given return precautions - PCP appointment scheduled for 06/21/14 at 11:30AM  Dysphagia:  The patient reports improvement today.  She says she ate breakfast and lunch without any difficulty or pain swallowing.  GI has evaluated her and recommend  outpatient work-up with Dr. Leone Payor.   - follow-up with GI on 07/04/14  Urethral spasm: Pt states that she as started on Flomax on 2/23 for urethral spasms. Urine with small hgb. - Continue Flomax - PCP can consider referral to urology   Hypokalemia:  3.3 this AM though I question accuracy since she has been normal throughout admission.   - repleted with of KDur - she is eating and drinking and diarrhea has improved so I do not think she will require further supplementation - follow-up BMP and Mg at hospital follow-up appt  Mild Non-AG metabolic acidosis: bicarb 24-->17-->18 this admission.  This is c/w hx of diarrhea.  Diarrhea is improving and she is eating and drinking.  - follow-up BMP at  hospital follow-up  Substance abuse: Patient has reported that she drinks 5L box of wine q4 days to more than one doctor during this admission.  When I questioned her with her husband present he says he thinks she is mistaken and that the box may be about 1L.  She agrees that she may need to cut down.  Her CIWAs have been 0 without any signs of withdrawal this admission.  - CSW was consulted was unable to make it to see the patient prior to discharge. - I advised that she follow-up with her PCP for available resources.   BRBPR: Patient reported small red drops of blood in stool but none in the past day.  Last colonoscopy was 2013 and was normal. Hgb 12.9 --> 10.7 this admission.  There is likely a component of dilution since her other counts also dropped while getting fluids.  - follow-up CBC at hospital follow-up  Palpitations:  Stable.  No tachycardia, chest pain or EKG changes this admission. - Follow-up with PCP and with Cardiology as needed as an outpatient.  Dispo: She is stable for discharge home today with follow-up scheduled with her PCP and and GI.  The patient does have a current PCP Marva Panda, NP) and does need an [facility] hospital follow-up appointment after discharge.  The patient does not have transportation limitations that hinder transportation to clinic appointments.  .Services Needed at time of discharge: Y = Yes, Blank = No PT:   OT:   RN:   Equipment:   Other:     LOS: 2 days   Yolanda Manges, DO 06/18/2014, 3:58 PM

## 2014-06-18 NOTE — Progress Notes (Signed)
Progress Note for [facility]  Subjective: Feeling well.  She had a "busy night" last evening with bowel movements and flatus.  No abdominal pain.  Objective: Vital signs in last 24 hours: Temp:  [97.4 F (36.3 C)-98.4 F (36.9 C)] 98.4 F (36.9 C) (02/27 0559) Pulse Rate:  [64-67] 64 (02/27 0559) Resp:  [15-16] 16 (02/27 0559) BP: (120-133)/(61-76) 133/76 mmHg (02/27 0559) SpO2:  [99 %-100 %] 99 % (02/27 0559) Last BM Date: 06/17/14  Intake/Output from previous day: 02/26 0701 - 02/27 0700 In: 1786.3 [P.O.:240; I.V.:1546.3] Out: 775 [Urine:775] Intake/Output this shift:    General appearance: alert and no distress GI: soft, non-tender; bowel sounds normal; no masses,  no organomegaly  Lab Results:  Recent Labs  06/16/14 0316 06/17/14 0500 06/18/14 0500  WBC 7.9 8.0 6.1  HGB 12.9 11.0* 10.7*  HCT 38.2 33.7* 32.2*  PLT 305 261 233   BMET  Recent Labs  06/16/14 0316 06/17/14 0500  NA 136 138  K 3.5 3.9  CL 101 105  CO2 24 17*  GLUCOSE 113* 50*  BUN 6 6  CREATININE 0.50 0.64  CALCIUM 9.1 8.5   LFT  Recent Labs  06/17/14 0500  PROT 5.9*  ALBUMIN 3.3*  AST 26  ALT 12  ALKPHOS 53  BILITOT 1.2   PT/INR No results for input(s): LABPROT, INR in the last 72 hours. Hepatitis Panel No results for input(s): HEPBSAG, HCVAB, HEPAIGM, HEPBIGM in the last 72 hours. C-Diff No results for input(s): CDIFFTOX in the last 72 hours. Fecal Lactopherrin No results for input(s): FECLLACTOFRN in the last 72 hours.  Studies/Results: Dg Abd 2 Views  06/17/2014   CLINICAL DATA:  Lower abdominal and pelvic pain. Partial small bowel obstruction. nausea and vomiting. Diarrhea.  EXAM: ABDOMEN - 2 VIEW  COMPARISON:  CT on 06/16/2014  FINDINGS: Oral contrast from recent CT is now seen throughout the colon to the level the rectum. There are several mildly dilated small bowel loops seen within the mid abdomen. No air-fluid levels or free intraperitoneal air noted on the erect  view.  IMPRESSION: Mildly dilated small bowel loops within the central abdomen, with oral contrast mass seen throughout the colon. Low-grade partial small bowel obstruction cannot be excluded.   Electronically Signed   By: Myles RosenthalJohn  Stahl M.D.   On: 06/17/2014 11:32    Medications:  Scheduled: . folic acid  1 mg Oral Daily  . heparin  5,000 Units Subcutaneous 3 times per day  . multivitamin with minerals  1 tablet Oral Daily  . ondansetron (ZOFRAN) IV  4 mg Intravenous Once  . tamsulosin  0.4 mg Oral Daily  . thiamine  100 mg Oral Daily   Continuous: . dextrose 5 % and 0.45% NaCl 75 mL/hr at 06/17/14 2300    Assessment/Plan: 1) Presumed viral gastroenteritis. 2) Dysphagia.   Clinically she is well.  She is tolerating a regular diet at this point.  She can be safely discharged home.  Plan: 1) Okay to D/C Home. 2) Follow up with [facility] for her dysphagia.   LOS: 2 days   Rena Hunke D 06/18/2014, 8:32 AM

## 2014-06-18 NOTE — Discharge Summary (Signed)
Patient Name:  Kristina Dennis  MRN: [id number]  PCP: Egbert Garibaldi, NP  DOB:  January 23, 1946       Date of Admission:  06/16/2014  Date of Discharge:  06/18/2014      Attending Physician: Dr. Farley Ly, MD         DISCHARGE DIAGNOSES: 1.   Presumed viral gastroenteritis 2.   CT finding of partial small bowel obstruction 3.   Dysphagia 4.   Bright red blood per rectum 5.   Urethral spasm 6.   Excessive drinking of alcohol 7.   Tobacco use disorder 8.   Hypokalemia 9.   Non-anion gap metabolic acidosis 10.   Palpitations   DISPOSITION AND FOLLOW-UP: Kristina Dennis is to follow-up with the listed providers as detailed below, at patient's visiting, please address following issues:  1) Have her symptoms resolved? 2) Please discuss alcohol and tobacco cessation and provide resources. 3) She will need a BMP and Mg to assess potassium and check for resolution of non-AG metabolic acidosis. 4) She will need a CBC to check for hgb stability.  Follow-up Information    Follow up with Mike Gip, PA-C On 07/04/2014.   Specialty:  Gastroenterology   Why:  9 AM to follow up swallowing problems with Dr Marvell Fuller PA.    Contact information:   [address] [place] [address] [phone]       Follow up with Egbert Garibaldi, NP On 06/21/2014.   Why:  at 11:30AM for hospital follow-up   Contact information:   [facility] [address] [place] [address] [phone]      Discharge Instructions    Call MD for:  difficulty breathing, headache or visual disturbances    Complete by:  As directed      Call MD for:  extreme fatigue    Complete by:  As directed      Call MD for:  hives    Complete by:  As directed      Call MD for:  persistant dizziness or light-headedness    Complete by:  As directed      Call MD for:  persistant nausea and vomiting    Complete by:  As directed      Call MD for:  redness, tenderness, or signs of  infection (pain, swelling, redness, odor or green/yellow discharge around incision site)    Complete by:  As directed      Call MD for:  severe uncontrolled pain    Complete by:  As directed      Call MD for:  temperature >100.4    Complete by:  As directed      Diet - low sodium heart healthy    Complete by:  As directed      Increase activity slowly    Complete by:  As directed             DISCHARGE MEDICATIONS:   Medication List    STOP taking these medications        oxyCODONE-acetaminophen 5-325 MG per tablet  Commonly known as:  ROXICET      TAKE these medications        ondansetron 4 MG tablet  Commonly known as:  ZOFRAN  Take 1 tablet (4 mg total) by mouth every 8 (eight) hours as needed for nausea or vomiting.     tamsulosin 0.4 MG Caps capsule  Commonly known as:  FLOMAX  Take 1 capsule by mouth daily.         CONSULTS:  Gastroenterology General Surgery   PROCEDURES PERFORMED:  Ct Abdomen Pelvis W Contrast  06/16/2014   CLINICAL DATA:  Diarrhea and abdominal pain for 3-4 days. Nausea. Blood in the stool  EXAM: CT ABDOMEN AND PELVIS WITH CONTRAST  TECHNIQUE: Multidetector CT imaging of the abdomen and pelvis was performed using the standard protocol following bolus administration of intravenous contrast.  CONTRAST:  80mL OMNIPAQUE IOHEXOL 300 MG/ML  SOLN  COMPARISON:  CT of the abdomen and pelvis 07/05/2004  FINDINGS: The lung bases are clear without focal nodule, mass, or airspace disease. The heart size is normal. No significant pleural or pericardial effusion is present.  The liver is within normal limits. Spleen is small. The stomach is contracted. No focal lesions are evident. The duodenum is somewhat dilated. Common bile duct and gallbladder are normal. The pancreas is within normal limits.  The adrenal glands are normal bilaterally. A 3 cm benign cyst is noted in the right kidney. The kidneys and ureters are otherwise within normal limits.  The distal  colon is mostly collapsed. There is fluid within the transverse colon. The appendix is visualized and normal.  Oral contrast is seen within the distal bowel. Multiple loops of bowel are slightly distended, but less than 3 cm. A distal transition is evident without a mass lesion. The terminal ileum is collapsed.  The uterus an at neck is are within normal limits. No significant adenopathy is present. There is no free fluid. Extensive atherosclerotic calcifications are present in the aorta without aneurysm.  Urinary bladder is within normal limits. The bone windows are unremarkable.  IMPRESSION: 1. Partial distal small bowel obstruction without a distinct transition or mass lesion. 2. No free fluid or free air. 3. Moderate atherosclerotic disease. 4. Benign appearing cyst in the right kidney.   Electronically Signed   By: Marin Robertshristopher  Mattern M.D.   On: 06/16/2014 07:41   Dg Abd 2 Views  06/17/2014   CLINICAL DATA:  Lower abdominal and pelvic pain. Partial small bowel obstruction. nausea and vomiting. Diarrhea.  EXAM: ABDOMEN - 2 VIEW  COMPARISON:  CT on 06/16/2014  FINDINGS: Oral contrast from recent CT is now seen throughout the colon to the level the rectum. There are several mildly dilated small bowel loops seen within the mid abdomen. No air-fluid levels or free intraperitoneal air noted on the erect view.  IMPRESSION: Mildly dilated small bowel loops within the central abdomen, with oral contrast mass seen throughout the colon. Low-grade partial small bowel obstruction cannot be excluded.   Electronically Signed   By: Myles RosenthalJohn  Stahl M.D.   On: 06/17/2014 11:32       ADMISSION DATA: H&P: HPI: 69yo F w/ PMH benign heart palpitations and tobacco and EtOH abuse presents with diarrhea and cramping abdominal pain. She states that her diarrhea and pain began on Sunday evening. The pain occurs immediately prior to the diarrhea and usually resolves after the diarrheal episode. She states that she had at least 11  liquid stools Sunday and decided to [facility], where her PCP is located. She was told that she likely had a viral illness and began taking an anti-diarrheal, which initially improved her diarrhea and abd pain until Tuesday evening, when the liquid stools returned. She has continued to take the anti-diarrheal without improvement in her symptoms. She denies any emesis but does endorse nausea since dinner last night. Now her  pain is more constant and does not improve with each bowel movement. She states that her stools are completely liquid and green.   She denies sick contacts but did take 2 rounds of antibiotics last month for a sinus infection.   Physical Exam: Blood pressure 140/82, pulse 69, temperature 98 F (36.7 C), temperature source Oral, resp. rate 16, last menstrual period 05/03/1987, SpO2 97 %. General: Thin female who is cooperative on examination and in mild distress  Head: Normocephalic and atraumatic.  Eyes: EOMI, pupils equal, round, and reactive to light, no injection and anicteric.  Mouth: No erythema or exudates, dry mucus membranes and tongue Neck: Supple, full ROM  Lungs: CTAB, normal respiratory effort, no accessory muscle use, no crackles, and no wheezes. Heart: Regular rate, regular rhythm, no murmur, no gallop, and no rub.  Abdomen: Soft, non-distended, TTP in mid abdomen, bowel sounds hyperactive, involuntary guarding.  Msk: No joint swelling.  Extremities: 2+ DP pulses bilaterally. No edema Neurologic: Alert & oriented X3, cranial nerves II-XII intact, strength normal in all extremities Skin: No rashes.  Psych: Memory intact for recent and remote, normally interactive, good eye contact, mildly anxious  Labs: Basic Metabolic Panel:  Recent Labs (last 2 labs)      Recent Labs  06/16/14 0316  NA 136  K 3.5  CL 101  CO2 24  GLUCOSE 113*  BUN 6  CREATININE 0.50  CALCIUM 9.1     Liver Function Tests:  Recent Labs  (last 2 labs)      Recent Labs  06/16/14 0316  AST 35  ALT 15  ALKPHOS 67  BILITOT 0.4  PROT 7.3  ALBUMIN 4.2      Recent Labs (last 2 labs)      Recent Labs  06/16/14 0316  LIPASE 19     CBC:  Recent Labs (last 2 labs)      Recent Labs  06/16/14 0316  WBC 7.9  NEUTROABS 5.1  HGB 12.9  HCT 38.2  MCV 98.2  PLT 305     Urinalysis:  Recent Labs (last 2 labs)      Recent Labs  06/16/14 0346  COLORURINE YELLOW  LABSPEC 1.016  PHURINE 5.0  GLUCOSEU NEGATIVE  HGBUR SMALL*  BILIRUBINUR NEGATIVE  KETONESUR NEGATIVE  PROTEINUR NEGATIVE  UROBILINOGEN 0.2  NITRITE NEGATIVE  LEUKOCYTESUR NEGATIVE         HOSPITAL COURSE: Presumed acute viral gastroenteritis with partial SBO on imaging: The patient was initially kept NPO and Surgery was consulted.  Surgery felt there was no need for NGT and that this would resolve spontaneously.  Diet was slowly advanced and the patient began to improve.  Clostridium difficile testing was negative.  She remained afebrile, did not have a leukocytosis and diarrhea improved.  She was tolerating a soft diet and eager to go home on the day of discharge.  Dysphagia: She reported dysphagia for solids for the past 3 months.  GI was consulted and the patient decided to have outpatient work-up for this problem.  On the day of discharge she said she ate breakfast and lunch without any difficulty or pain swallowing. Follow-up has been arranged with Dr. Marvell Fuller office on 07/04/14.  BRBPR: She reported small amount of bright red blood after diarrhea on day of admission.  She reports hemorrhoid history and thinks this is the cause.  Her hemoglobin was monitored during admission and it remained fairly stable; the slight decrease from 12.9 --> 10.7 mirrored a drop in all cell  lines and was likely dilutional since the patient was given fluids.  GI was consulted for dysphagia as above and she  informed them of rare blood in stool.  Her last colonoscopy was in 2013 and was normal.  Follow-up has been arranged with Dr. Marvell Fuller office on 07/04/14.  Would recommend a CBC at hospital follow-up.  Urethral spasm: She was started on Flomax on 2/23 for urethral spasms. She was found to have Hgb in her urine at her PCP's office, which was attributed to possible passage of a kidney stone.  UA was positive for small hgb this admission but negative for nitrite or leukocytes.  Flomax was continued and the patient reported improvement in her symptoms.  Excessive drinking of alcohol: The patient reported drinking 5L of boxed wine every four days to more than one provider during this admission.  However, when I spoke with her and her husband on the day of discharge he thought she may have overestimated.  He thinks it may be closer to 1L of boxed wine.  of wine per day is still nearly twice the recommended 5 ounce daily wine limit for a woman.  She agreed that she should cut back.  She was hydrated with a 1L banana bag at admission.  She was monitored on CIWA during admission and never showed any signs of withdrawal or required Ativan.  CSW was consulted to provide resources but was unable to see her prior to discharge.  I have asked the patient to seek out resources for cessation at her PCP's office.  I will forward this discharge summary to the PCP.  Tobacco use disorder:  The patient reported smoking 1/2 PPD for the past 50 years.  She will need continued counseling on cessation.  I will forward this discharge summary to the PCP.  Hypokalemia: The patient had an isolated low potassium of 3.3 and was supplemented with oral Kdur.  The low K was likely a result of decreased po and diarrhea.  On day of discharge she was eating well and diarrhea improved so I do not think she will require additional supplementation.  She will need a BMP and Mg at hospital follow-up appointment.   Non-AG metabolic acidosis:   Her bicarb 24-->17-->18 this admission consistent with her diarrhea history.  Her diarrhea is improving and she is eating and drinking so I expect this to resolve.  She will need a BMP and Mg at hospital follow-up appointment.   Palpitations: The patient has a history of benign palpitations.  This has been evaluated in the past and felt to be due to [facility] vs PVCs.  It was recommended that she follow-up with Cardiology in 1 year but it does not look like she did.  She reports intermittent palpitations.  EKG was NSR and she had no CP or dyspnea during admission.  She should follow-up with Cardiology.  DISCHARGE DATA: Vital Signs: BP 133/76 mmHg  Pulse 64  Temp(Src) 98.4 F (36.9 C) (Oral)  Resp 16  Ht 5\' 6"  (1.676 m)  Wt 100 lb (45.36 kg)  BMI 16.15 kg/m2  SpO2 99%  LMP 05/03/1987  Labs: Results for orders placed or performed during the hospital encounter of 06/16/14 (from the past 24 hour(s))  Glucose, capillary     Status: Abnormal   Collection Time: 06/17/14  4:59 PM  Result Value Ref Range   Glucose-Capillary 201 (H) 70 - 99 mg/dL  Glucose, capillary     Status: Abnormal   Collection Time: 06/17/14  8:05 PM  Result Value Ref Range   Glucose-Capillary 170 (H) 70 - 99 mg/dL  Glucose, capillary     Status: Abnormal   Collection Time: 06/18/14 12:16 AM  Result Value Ref Range   Glucose-Capillary 110 (H) 70 - 99 mg/dL  Glucose, capillary     Status: Abnormal   Collection Time: 06/18/14  4:08 AM  Result Value Ref Range   Glucose-Capillary 128 (H) 70 - 99 mg/dL  CBC     Status: Abnormal   Collection Time: 06/18/14  5:00 AM  Result Value Ref Range   WBC 6.1 4.0 - 10.5 K/uL   RBC 3.24 (L) 3.87 - 5.11 MIL/uL   Hemoglobin 10.7 (L) 12.0 - 15.0 g/dL   HCT 16.1 (L) 09.6 - 04.5 %   MCV 99.4 78.0 - 100.0 fL   MCH 33.0 26.0 - 34.0 pg   MCHC 33.2 30.0 - 36.0 g/dL   RDW 40.9 81.1 - 91.4 %   Platelets 233 150 - 400 K/uL  Basic metabolic panel     Status: Abnormal   Collection Time:  06/18/14  5:00 AM  Result Value Ref Range   Sodium 134 (L) 135 - 145 mmol/L   Potassium 3.3 (L) 3.5 - 5.1 mmol/L   Chloride 108 96 - 112 mmol/L   CO2 18 (L) 19 - 32 mmol/L   Glucose, Bld 108 (H) 70 - 99 mg/dL   BUN <5 (L) 6 - 23 mg/dL   Creatinine, Ser 7.82 (L) 0.50 - 1.10 mg/dL   Calcium 8.4 8.4 - 95.6 mg/dL   GFR calc non Af Amer >90 >90 mL/min   GFR calc Af Amer >90 >90 mL/min   Anion gap 8 5 - 15  Glucose, capillary     Status: Abnormal   Collection Time: 06/18/14 12:32 PM  Result Value Ref Range   Glucose-Capillary 132 (H) 70 - 99 mg/dL   Comment 1 Notify RN      Services Ordered on Discharge: Y = Yes; Blank = No PT:   OT:   RN:   Equipment:   Other:      Time Spent on Discharge: 35 min   Signed: Evelena Peat PGY 2, Internal Medicine Resident 06/18/2014, 4:25 PM

## 2014-06-18 NOTE — Discharge Instructions (Addendum)
1. I have scheduled an appointment with your PCP, Kristina Dennis on Tuesday, 06/21/14 at 11:30AM. You will need lab work at that time to check you potassium.  We have scheduled you an appointment with Dr. Marvell FullerGessner's physician assistant, Kristina Dennis P.A. on 07/04/14 for further evaluation of your trouble swallowing.  Please let the office know if you need to reschedule.  2. Please avoid narcotic medications which may worsen your bowel symptoms.  Please advance your diet slowly.    2. Please take all medications as prescribed.    3. If you have worsening of your symptoms or new symptoms arise (inclduing constipation, severe diarrhea, severe abdominal pain, fever, vomiting, increasing difficulty swallowing, pain with swallowing) please call your primary care physician or go to the ER immediately if symptoms are severe.    Viral Gastroenteritis Viral gastroenteritis is also known as stomach flu. This condition affects the stomach and intestinal tract. It can cause sudden diarrhea and vomiting. The illness typically lasts 3 to 8 days. Most people develop an immune response that eventually gets rid of the virus. While this natural response develops, the virus can make you quite ill. CAUSES  Many different viruses can cause gastroenteritis, such as rotavirus or noroviruses. You can catch one of these viruses by consuming contaminated food or water. You may also catch a virus by sharing utensils or other personal items with an infected person or by touching a contaminated surface. SYMPTOMS  The most common symptoms are diarrhea and vomiting. These problems can cause a severe loss of body fluids (dehydration) and a body salt (electrolyte) imbalance. Other symptoms may include:  Fever.  Headache.  Fatigue.  Abdominal pain. DIAGNOSIS  Your caregiver can usually diagnose viral gastroenteritis based on your symptoms and a physical exam. A stool sample may also be taken to test for the presence of  viruses or other infections. TREATMENT  This illness typically goes away on its own. Treatments are aimed at rehydration. The most serious cases of viral gastroenteritis involve vomiting so severely that you are not able to keep fluids down. In these cases, fluids must be given through an intravenous line (IV). HOME CARE INSTRUCTIONS   Drink enough fluids to keep your urine clear or pale yellow. Drink small amounts of fluids frequently and increase the amounts as tolerated.  Ask your caregiver for specific rehydration instructions.  Avoid:  Foods high in sugar.  Alcohol.  Carbonated drinks.  Tobacco.  Juice.  Caffeine drinks.  Extremely hot or cold fluids.  Fatty, greasy foods.  Too much intake of anything at one time.  Dairy products until 24 to 48 hours after diarrhea stops.  You may consume probiotics. Probiotics are active cultures of beneficial bacteria. They may lessen the amount and number of diarrheal stools in adults. Probiotics can be found in yogurt with active cultures and in supplements.  Wash your hands well to avoid spreading the virus.  Only take over-the-counter or prescription medicines for pain, discomfort, or fever as directed by your caregiver. Do not give aspirin to children. Antidiarrheal medicines are not recommended.  Ask your caregiver if you should continue to take your regular prescribed and over-the-counter medicines.  Keep all follow-up appointments as directed by your caregiver. SEEK IMMEDIATE MEDICAL CARE IF:   You are unable to keep fluids down.  You do not urinate at least once every 6 to 8 hours.  You develop shortness of breath.  You notice blood in your stool or vomit. This may look like  coffee grounds.  You have abdominal pain that increases or is concentrated in one small area (localized).  You have persistent vomiting or diarrhea.  You have a fever.  The patient is a child younger than 3 months, and he or she has a  fever.  The patient is a child older than 3 months, and he or she has a fever and persistent symptoms.  The patient is a child older than 3 months, and he or she has a fever and symptoms suddenly get worse.  The patient is a baby, and he or she has no tears when crying. MAKE SURE YOU:   Understand these instructions.  Will watch your condition.  Will get help right away if you are not doing well or get worse. Document Released: 04/08/2005 Document Revised: 07/01/2011 Document Reviewed: 01/23/2011 [facility] Patient Information 2015 [place], [place]. This information is not intended to replace advice given to you by your health care provider. Make sure you discuss any questions you have with your health care provider.

## 2014-06-21 LAB — FECAL LACTOFERRIN, QUANT: Fecal Lactoferrin: POSITIVE

## 2014-06-24 ENCOUNTER — Encounter: Payer: Self-pay | Admitting: Cardiovascular Disease

## 2014-07-04 ENCOUNTER — Encounter: Payer: Self-pay | Admitting: Physician Assistant

## 2014-07-04 ENCOUNTER — Ambulatory Visit (INDEPENDENT_AMBULATORY_CARE_PROVIDER_SITE_OTHER): Payer: Medicare Other | Admitting: Physician Assistant

## 2014-07-04 VITALS — BP 148/80 | HR 80 | Ht 66.0 in | Wt 98.8 lb

## 2014-07-04 DIAGNOSIS — K219 Gastro-esophageal reflux disease without esophagitis: Secondary | ICD-10-CM

## 2014-07-04 DIAGNOSIS — R1314 Dysphagia, pharyngoesophageal phase: Secondary | ICD-10-CM

## 2014-07-04 NOTE — Progress Notes (Signed)
Patient ID: Kristina Dennis, female   DOB: [birth year], 69 y.o.   MRN: [id number]   Subjective:    Patient ID: Kristina Dennis, female    DOB: [birth year], 69 y.o.   MRN: [id number]  HPI Kristina Dennis is a pleasant 69 year old white female known to Dr. Leone PayorGessner. She comes in today as a post hospital follow-up to discuss dysphagia. Patient had undergone colonoscopy in January 2013 which was a normal exam. She doesn't family history of colon cancer in her sister. She had a recent hospitalization to 24 through 06/18/2014 with abdominal pain nausea and vomiting. GI was consulted as initial CT imaging was concerning for partial small bowel obstruction. Stool studies were negative and she gradually improved with supportive management. At that time she had stated that she was having some difficulty swallowing for the previous 3 months. She says that has completely resolved at this point and she's not having any difficulty swallowing. She says she really wasn't having dysphasia so much as a fullness sensation in her esophagus. He has no complaints of heartburn or indigestion says she swallows liquids and swallow solids without difficulty and has been eating well. She has no complaints of abdominal pain bloating distention or nausea and says her bowels have been moving regularly. She says she does have to strain at times and sees a small amount of blood secondary to hemorrhoids on occasion.  Review of Systems Pertinent positive and negative review of systems were noted in the above HPI section.  All other review of systems was otherwise negative.  Outpatient Encounter Prescriptions as of 07/04/2014  Medication Sig  . [DISCONTINUED] ondansetron (ZOFRAN) 4 MG tablet Take 1 tablet (4 mg total) by mouth every 8 (eight) hours as needed for nausea or vomiting. (Patient not taking: Reported on 06/16/2014)  . [DISCONTINUED] tamsulosin (FLOMAX) 0.4 MG CAPS capsule Take 1 capsule by mouth daily.   Allergies  Allergen  Reactions  . Hydrocodone Nausea And Vomiting  . Niacin And Related Nausea And Vomiting  . Sulfa Antibiotics Nausea And Vomiting    Violently ill  . Latex Rash   Patient Active Problem List   Diagnosis Date Noted  . Excessive drinking of alcohol 06/18/2014  . Partial small bowel obstruction 06/16/2014  . Diarrhea 06/16/2014  . Urethral spasm 06/16/2014  . Palpitations 04/02/2011   History   Social History  . Marital Status: Married    Spouse Name: N/A  . Number of Children: 2  . Years of Education: N/A   Occupational History  . Retired    Social History Main Topics  . Smoking status: Current Some Day Smoker -- 0.50 packs/day for 50 years    Types: Cigarettes  . Smokeless tobacco: Never Used  . Alcohol Use: 0.0 oz/week    0 Standard drinks or equivalent per week     Comment: Drinks 1.25L wine a night  . Drug Use: No  . Sexual Activity: Not on file   Other Topics Concern  . Not on file   Social History Narrative    Kristina Dennis's family history includes Arrhythmia in her mother; Cancer in her sister; Hypertension in her father; Rectal cancer (age of onset: 6460) in her sister.      Objective:    Filed Vitals:   07/04/14 0903  BP: 148/80  Pulse: 80    Physical Exam well-developed older white female in no acute distress, pleasant pressure 148/80 pulse 80 height 5 foot 6 weight 98 pounds. HEENT ;nontraumatic normocephalic EOMI PERRLA sclera  anicteric, Neck; supple no JVD, Cardiovascular; regular rate and rhythm with S1-S2 no murmur or gallop, Pulmonary; clear bilaterally, Abdomen ;soft nontender nondistended bowel sounds are present no palpable mass or hepatosplenomegaly, Rectal ;exam not done, Extremities; no clubbing cyanosis or edema skin warm and dry, Psych ;mood and affect appropriate       Assessment & Plan:   #1 69 yo female s/p recent hospitzation with nausea,vomiting,abdominal pain and diarrhea- likley gastroentertitis and resolved #2 vague  dysphagia-resolved #3 colon screening- normal colon 04/2011- follow up 2018 #4 positive family hx of colon cancer -sister  Plan; PT will call for problems, and follow up with Dr Leone Payor as needed.   Marianela Mandrell S Ranell Skibinski PA-C 07/04/2014   Cc: Marva Panda, NP

## 2014-07-04 NOTE — Patient Instructions (Signed)
Follow up as with Dr. Leone PayorGessner.

## 2014-07-07 NOTE — Progress Notes (Signed)
Agree with Ms. Esterwood's assessment and plan. Hasson Gaspard E. Nahal Wanless, MD, FACG   

## 2014-10-17 ENCOUNTER — Other Ambulatory Visit: Payer: Self-pay

## 2015-02-08 ENCOUNTER — Other Ambulatory Visit: Payer: Self-pay

## 2015-02-08 DIAGNOSIS — Z1231 Encounter for screening mammogram for malignant neoplasm of breast: Secondary | ICD-10-CM

## 2015-03-21 ENCOUNTER — Ambulatory Visit: Payer: Medicare Other

## 2016-05-06 ENCOUNTER — Encounter: Payer: Self-pay | Admitting: Internal Medicine

## 2016-05-13 ENCOUNTER — Encounter: Payer: Self-pay | Admitting: Internal Medicine

## 2016-05-22 ENCOUNTER — Ambulatory Visit (AMBULATORY_SURGERY_CENTER): Payer: Self-pay | Admitting: *Deleted

## 2016-05-22 VITALS — Ht 65.0 in | Wt 95.0 lb

## 2016-05-22 DIAGNOSIS — Z800 Family history of malignant neoplasm of digestive organs: Secondary | ICD-10-CM

## 2016-05-22 NOTE — Progress Notes (Signed)
Patient denies any allergies to eggs or soy. Patient denies any problems with anesthesia/sedation. Patient denies any oxygen use at home and does not take any diet/weight loss medications. EMMI education declined by patient.  

## 2016-05-23 ENCOUNTER — Encounter: Payer: Self-pay | Admitting: Internal Medicine

## 2016-06-05 ENCOUNTER — Encounter: Payer: Self-pay | Admitting: Internal Medicine

## 2016-06-05 ENCOUNTER — Ambulatory Visit (AMBULATORY_SURGERY_CENTER): Payer: Medicare HMO | Admitting: Internal Medicine

## 2016-06-05 VITALS — BP 122/71 | HR 62 | Temp 99.1°F | Resp 15 | Ht 65.0 in | Wt 95.0 lb

## 2016-06-05 DIAGNOSIS — Z1211 Encounter for screening for malignant neoplasm of colon: Secondary | ICD-10-CM | POA: Diagnosis not present

## 2016-06-05 DIAGNOSIS — Z1212 Encounter for screening for malignant neoplasm of rectum: Secondary | ICD-10-CM

## 2016-06-05 DIAGNOSIS — Z800 Family history of malignant neoplasm of digestive organs: Secondary | ICD-10-CM | POA: Diagnosis not present

## 2016-06-05 MED ORDER — SODIUM CHLORIDE 0.9 % IV SOLN: 500.0000 mL | INTRAVENOUS | Status: DC

## 2016-06-05 NOTE — Patient Instructions (Signed)
YOU HAD AN ENDOSCOPIC PROCEDURE TODAY AT THE [facility]:   Refer to the procedure report that was given to you for any specific questions about what was found during the examination.  If the procedure report does not answer your questions, please call your gastroenterologist to clarify.  If you requested that your care partner not be given the details of your procedure findings, then the procedure report has been included in a sealed envelope for you to review at your convenience later.  YOU SHOULD EXPECT: Some feelings of bloating in the abdomen. Passage of more gas than usual.  Walking can help get rid of the air that was put into your GI tract during the procedure and reduce the bloating. If you had a lower endoscopy (such as a colonoscopy or flexible sigmoidoscopy) you may notice spotting of blood in your stool or on the toilet paper. If you underwent a bowel prep for your procedure, you may not have a normal bowel movement for a few days.  Please Note:  You might notice some irritation and congestion in your nose or some drainage.  This is from the oxygen used during your procedure.  There is no need for concern and it should clear up in a day or so.  SYMPTOMS TO REPORT IMMEDIATELY:   Following lower endoscopy (colonoscopy or flexible sigmoidoscopy):  Excessive amounts of blood in the stool  Significant tenderness or worsening of abdominal pains  Swelling of the abdomen that is new, acute  Fever of 100F or higher   Following upper endoscopy (EGD)  Vomiting of blood or coffee ground material  New chest pain or pain under the shoulder blades  Painful or persistently difficult swallowing  New shortness of breath  Fever of 100F or higher  Black, tarry-looking stools  For urgent or emergent issues, a gastroenterologist can be reached at any hour by calling [phone].   DIET:  We do recommend a small meal at first, but then you may proceed to your regular diet.  Drink  plenty of fluids but you should avoid alcoholic beverages for 24 hours.  ACTIVITY:  You should plan to take it easy for the rest of today and you should NOT DRIVE or use heavy machinery until tomorrow (because of the sedation medicines used during the test).    FOLLOW UP: Our staff will call the number listed on your records the next business day following your procedure to check on you and address any questions or concerns that you may have regarding the information given to you following your procedure. If we do not reach you, we will leave a message.  However, if you are feeling well and you are not experiencing any problems, there is no need to return our call.  We will assume that you have returned to your regular daily activities without incident.  If any biopsies were taken you will be contacted by phone or by letter within the next 1-3 weeks.  Please call us at [phone] if you have not heard about the biopsies in 3 weeks.    SIGNATURES/CONFIDENTIALITY: You and/or your care partner have signed paperwork which will be entered into your electronic medical record.  These signatures attest to the fact that that the information above on your After Visit Summary has been reviewed and is understood.  Full responsibility of the confidentiality of this discharge information lies with you and/or your care-partner. 

## 2016-06-05 NOTE — Progress Notes (Signed)
Pt's states no medical or surgical changes since previsit or office visit. 

## 2016-06-05 NOTE — Op Note (Signed)
[facility] Patient Name: Kristina InfanteKathleen Dennis Procedure Date: 06/05/2016 8:42 AM MRN: [id number] Endoscopist: Iva Booparl E Gessner , MD Age: 71 Referring MD:  Date of Birth: May 04, 1945 Gender: Female Account #: [id number] Procedure:                Colonoscopy Indications:              Screening patient at increased risk: Family history                            of 1st-degree relative with colorectal cancer at                            age 72 years (or older) Medicines:                Propofol per Anesthesia, Monitored Anesthesia Care Procedure:                Pre-Anesthesia Assessment:                           - Prior to the procedure, a History and Physical                            was performed, and patient medications and                            allergies were reviewed. The patient's tolerance of                            previous anesthesia was also reviewed. The risks                            and benefits of the procedure and the sedation                            options and risks were discussed with the patient.                            All questions were answered, and informed consent                            was obtained. Prior Anticoagulants: The patient has                            taken no previous anticoagulant or antiplatelet                            agents. ASA Grade Assessment: II - A patient with                            mild systemic disease. After reviewing the risks                            and benefits, the patient was deemed in  satisfactory condition to undergo the procedure.                           After obtaining informed consent, the colonoscope                            was passed under direct vision. Throughout the                            procedure, the patient's blood pressure, pulse, and                            oxygen saturations were monitored continuously. The                            Model  PCF-H190L [id number]) scope was introduced                            through the anus and advanced to the the cecum,                            identified by appendiceal orifice and ileocecal                            valve. The colonoscopy was performed without                            difficulty. The patient tolerated the procedure                            well. The quality of the bowel preparation was                            excellent. The ileocecal valve, appendiceal                            orifice, and rectum were photographed. The                            ileocecal valve, appendiceal orifice, and rectum                            were photographed. Scope In: 8:54:13 AM Scope Out: 9:07:38 AM Scope Withdrawal Time: 0 hours 6 minutes 10 seconds  Total Procedure Duration: 0 hours 13 minutes 25 seconds  Findings:                 The perianal and digital rectal examinations were                            normal.                           The entire examined colon appeared normal on direct  and retroflexion views. Complications:            No immediate complications. Estimated Blood Loss:     Estimated blood loss: none. Impression:               - The entire examined colon is normal on direct and                            retroflexion views.                           - No specimens collected. Recommendation:           - Patient has a contact number available for                            emergencies. The signs and symptoms of potential                            delayed complications were discussed with the                            patient. Return to normal activities tomorrow.                            Written discharge instructions were provided to the                            patient.                           - Resume previous diet.                           - Continue present medications.                           - No repeat  colonoscopy due to age and the absence                            of colonic polyps. Iva Boop, MD 06/05/2016 9:36:02 AM This report has been signed electronically.

## 2016-06-05 NOTE — Progress Notes (Signed)
A/ox3, pleased with MAC, report to RN 

## 2016-06-06 ENCOUNTER — Telehealth: Payer: Self-pay

## 2016-06-06 NOTE — Telephone Encounter (Signed)
  Follow up Call-  Call back number 06/05/2016  Post procedure Call Back phone  # [phone]  Permission to leave phone message Yes  Some recent data might be hidden     Patient questions:  Do you have a fever, pain , or abdominal swelling? No. Pain Score  0 *  Have you tolerated food without any problems? Yes.    Have you been able to return to your normal activities? Yes.    Do you have any questions about your discharge instructions: Diet   No. Medications  No. Follow up visit  No.  Do you have questions or concerns about your Care? No.  Actions: * If pain score is 4 or above: No action needed, pain <4.

## 2017-05-06 DIAGNOSIS — H6993 Unspecified Eustachian tube disorder, bilateral: Secondary | ICD-10-CM | POA: Insufficient documentation

## 2017-05-06 DIAGNOSIS — H60311 Diffuse otitis externa, right ear: Secondary | ICD-10-CM | POA: Insufficient documentation

## 2017-05-06 DIAGNOSIS — H6983 Other specified disorders of Eustachian tube, bilateral: Secondary | ICD-10-CM | POA: Insufficient documentation

## 2017-10-15 ENCOUNTER — Other Ambulatory Visit: Payer: Self-pay

## 2017-10-15 ENCOUNTER — Emergency Department (HOSPITAL_COMMUNITY): Payer: Medicare HMO

## 2017-10-15 ENCOUNTER — Emergency Department (HOSPITAL_COMMUNITY)
Admission: EM | Admit: 2017-10-15 | Discharge: 2017-10-16 | Disposition: A | Discharge status: Home / Self Care | Payer: Medicare HMO | Attending: Emergency Medicine | Admitting: Emergency Medicine

## 2017-10-15 ENCOUNTER — Encounter (HOSPITAL_COMMUNITY): Payer: Self-pay | Admitting: Emergency Medicine

## 2017-10-15 DIAGNOSIS — Y998 Other external cause status: Secondary | ICD-10-CM | POA: Diagnosis not present

## 2017-10-15 DIAGNOSIS — F17210 Nicotine dependence, cigarettes, uncomplicated: Secondary | ICD-10-CM | POA: Diagnosis not present

## 2017-10-15 DIAGNOSIS — S022XXA Fracture of nasal bones, initial encounter for closed fracture: Secondary | ICD-10-CM | POA: Diagnosis not present

## 2017-10-15 DIAGNOSIS — S270XXA Traumatic pneumothorax, initial encounter: Secondary | ICD-10-CM | POA: Diagnosis not present

## 2017-10-15 DIAGNOSIS — S82035A Nondisplaced transverse fracture of left patella, initial encounter for closed fracture: Secondary | ICD-10-CM | POA: Insufficient documentation

## 2017-10-15 DIAGNOSIS — S0993XA Unspecified injury of face, initial encounter: Secondary | ICD-10-CM | POA: Diagnosis present

## 2017-10-15 DIAGNOSIS — Z79899 Other long term (current) drug therapy: Secondary | ICD-10-CM | POA: Diagnosis not present

## 2017-10-15 DIAGNOSIS — I10 Essential (primary) hypertension: Secondary | ICD-10-CM | POA: Diagnosis not present

## 2017-10-15 DIAGNOSIS — Y92017 Garden or yard in single-family (private) house as the place of occurrence of the external cause: Secondary | ICD-10-CM | POA: Insufficient documentation

## 2017-10-15 DIAGNOSIS — W01198A Fall on same level from slipping, tripping and stumbling with subsequent striking against other object, initial encounter: Secondary | ICD-10-CM | POA: Diagnosis not present

## 2017-10-15 DIAGNOSIS — Z23 Encounter for immunization: Secondary | ICD-10-CM | POA: Diagnosis not present

## 2017-10-15 DIAGNOSIS — Y93H2 Activity, gardening and landscaping: Secondary | ICD-10-CM | POA: Diagnosis not present

## 2017-10-15 DIAGNOSIS — Z91040 Latex allergy status: Secondary | ICD-10-CM | POA: Insufficient documentation

## 2017-10-15 DIAGNOSIS — Z7982 Long term (current) use of aspirin: Secondary | ICD-10-CM | POA: Insufficient documentation

## 2017-10-15 LAB — BASIC METABOLIC PANEL
Anion gap: 11 (ref 5–15)
BUN: <5 mg/dL — ABNORMAL LOW (ref 8–23)
CALCIUM: 9.5 mg/dL (ref 8.9–10.3)
CO2: 26 mmol/L (ref 22–32)
CREATININE: 0.41 mg/dL — AB (ref 0.44–1.00)
Chloride: 100 mmol/L (ref 98–111)
Glucose, Bld: 97 mg/dL (ref 70–99)
Potassium: 4 mmol/L (ref 3.5–5.1)
SODIUM: 137 mmol/L (ref 135–145)

## 2017-10-15 LAB — CBC WITH DIFFERENTIAL/PLATELET
Abs Immature Granulocytes: 0 10*3/uL (ref 0.0–0.1)
BASOS ABS: 0.1 10*3/uL (ref 0.0–0.1)
Basophils Relative: 1 %
EOS ABS: 0.1 10*3/uL (ref 0.0–0.7)
EOS PCT: 1 %
HCT: 40.9 % (ref 36.0–46.0)
Hemoglobin: 13.7 g/dL (ref 12.0–15.0)
Immature Granulocytes: 0 %
Lymphocytes Relative: 23 %
Lymphs Abs: 2 10*3/uL (ref 0.7–4.0)
MCH: 35 pg — ABNORMAL HIGH (ref 26.0–34.0)
MCHC: 33.5 g/dL (ref 30.0–36.0)
MCV: 104.6 fL — ABNORMAL HIGH (ref 78.0–100.0)
Monocytes Absolute: 0.9 10*3/uL (ref 0.1–1.0)
Monocytes Relative: 11 %
Neutro Abs: 5.5 10*3/uL (ref 1.7–7.7)
Neutrophils Relative %: 64 %
PLATELETS: 192 10*3/uL (ref 150–400)
RBC: 3.91 MIL/uL (ref 3.87–5.11)
RDW: 12.6 % (ref 11.5–15.5)
WBC: 8.7 10*3/uL (ref 4.0–10.5)

## 2017-10-15 MED ORDER — TETANUS-DIPHTHERIA TOXOIDS TD 5-2 LFU IM INJ
0.5000 mL | INJECTION | Freq: Once | INTRAMUSCULAR | Status: AC
  Administered 2017-10-15: 0.5 mL via INTRAMUSCULAR
  Filled 2017-10-15: qty 0.5

## 2017-10-15 MED ORDER — IBUPROFEN 800 MG PO TABS
800.0000 mg | ORAL_TABLET | Freq: Once | ORAL | Status: AC
  Administered 2017-10-15: 800 mg via ORAL
  Filled 2017-10-15: qty 1

## 2017-10-15 MED ORDER — TRAMADOL HCL 50 MG PO TABS: 50.0000 mg | ORAL_TABLET | Freq: Four times a day (QID) | ORAL | 0 refills | Status: DC | PRN

## 2017-10-15 NOTE — ED Notes (Signed)
Ortho tech paged  

## 2017-10-15 NOTE — ED Provider Notes (Signed)
Patient placed in Quick Look pathway, seen and evaluated   Chief Complaint: fall,  TRIPPED,  Facial injuries, Left knee pai and swelling.    *NEEDS TDAP* HPI:   As above  ROS: fall, knee pain , lacs, abrasions (one)  Physical Exam:   Gen: No distress  Neuro: Awake and Alert  Skin: Warm    Focused Exam: facial trauma.   Initiation of care has begun. The patient has been counseled on the process, plan, and necessity for staying for the completion/evaluation, and the remainder of the medical screening examination    Arthor CaptainHarris, Sakari Raisanen, Cordelia Poche-C 10/15/17 1941    Mesner, Barbara CowerJason, MD 10/16/17 [id number]

## 2017-10-15 NOTE — ED Provider Notes (Signed)
[facility] EMERGENCY DEPARTMENT Provider Note   CSN: [id number] Arrival date & time: 10/15/17  1843     History   Chief Complaint Chief Complaint  Patient presents with  . Fall  . Facial Injury    HPI Kristina Dennis is a 72 y.o. female.  Chief complaint is fall  HPI: 72 year old female.  She fell while picking some deal in her garden tonight.  She fell forward.  She struck her nose, chest, and left knee on concrete surface.  She had pain and required some assistance getting up.  Comes in for evaluation.  No loss of consciousness.  Abrasion to her nasal bridge and some nasal pain.  No loss of consciousness.  No malocclusion or dental trauma.  Left knee pain.  No chest pain or shortness of breath  Past Medical History:  Diagnosis Date  . Arthritis   . Colitis 2003   per path report from 2003: non-specific.  normal colonoscopy in 04/2011.   Marland Kitchen Hypertension   . Osteoporosis   . Palpitations   . Pneumonia    hx  . Rhinitis     Patient Active Problem List   Diagnosis Date Noted  . Excessive drinking of alcohol 06/18/2014  . Partial small bowel obstruction (HCC) 06/16/2014  . Diarrhea 06/16/2014  . Urethral spasm 06/16/2014  . Palpitations 04/02/2011    Past Surgical History:  Procedure Laterality Date  . BUNIONECTOMY Left   . DILATION AND CURETTAGE OF UTERUS    . KNEE SURGERY Right    arthroscopy  . ORIF WRIST FRACTURE Left 12/13/2013   Procedure: OPEN REDUCTION INTERNAL FIXATION (ORIF) WRIST FRACTURE;  Surgeon: Cammy Copa, MD;  Location: [facility] OR;  Service: Orthopedics;  Laterality: Left;  . TONSILLECTOMY    . TUBAL LIGATION       OB History   None      Home Medications    Prior to Admission medications   Medication Sig Start Date End Date Taking? Authorizing Provider  amLODipine (NORVASC) 5 MG tablet Take 5 mg by mouth See admin instructions. Take 5 mg by mouth once a day only if B/P is >120/80-85   Yes [provider]    aspirin EC 81 MG tablet Take 81 mg by mouth daily.   Yes [provider]  Cholecalciferol (VITAMIN D-3 PO) Take 1 capsule by mouth 3 (three) times a week.   Yes [provider]  traMADol (ULTRAM) 50 MG tablet Take 1 tablet (50 mg total) by mouth every 6 (six) hours as needed. 10/15/17   Rolland Porter, MD    Family History Family History  Problem Relation Age of Onset  . Hypertension Father   . Arrhythmia Mother   . Cancer Sister        rectal   . Rectal cancer Sister 18  . Colon cancer Sister 14    Social History Social History   Tobacco Use  . Smoking status: Current Some Day Smoker    Packs/day: 0.50    Years: 50.00    Pack years: 25.00    Types: Cigarettes  . Smokeless tobacco: Never Used  Substance Use Topics  . Alcohol use: No    Alcohol/week: 0.0 oz  . Drug use: No     Allergies   Hydrocodone; Azithromycin; Excedrin extra strength [aspirin-acetaminophen-caffeine]; Niacin; Niacin and related; Sulfa antibiotics; Sulfasalazine; and Latex   Review of Systems Review of Systems  Constitutional: Negative for appetite change, chills, diaphoresis, fatigue and fever.  HENT: Negative for mouth sores, sore throat and trouble swallowing.        Nasal abrasion.  Nasal pain.  Eyes: Negative for visual disturbance.  Respiratory: Negative for cough, chest tightness, shortness of breath and wheezing.   Cardiovascular: Negative for chest pain.  Gastrointestinal: Negative for abdominal distention, abdominal pain, diarrhea, nausea and vomiting.  Endocrine: Negative for polydipsia, polyphagia and polyuria.  Genitourinary: Negative for dysuria, frequency and hematuria.  Musculoskeletal: Negative for gait problem.       Left knee pain  Skin: Negative for color change, pallor and rash.  Neurological: Negative for dizziness, syncope, light-headedness and headaches.  Hematological: Does not bruise/bleed easily.  Psychiatric/Behavioral: Negative for behavioral problems  and confusion.     Physical Exam Updated Vital Signs BP (!) 158/96   Pulse 81   Temp 98.3 F (36.8 C) (Oral)   Resp 18   LMP 05/03/1987   SpO2 99%   Physical Exam  Constitutional: She is oriented to person, place, and time. She appears well-developed and well-nourished. No distress.  HENT:  Head: Normocephalic.  Has a self approximating laceration across the nasal bridge consistent with her wire and glasses.  No blood over the TMs are mastoids or from ears nose or mouth.  No malocclusion or dental trauma.  No septal hematoma.  Nontender over the midline neck and spine  Eyes: Pupils are equal, round, and reactive to light. Conjunctivae are normal. No scleral icterus.  Neck: Normal range of motion. Neck supple. No thyromegaly present.  Cardiovascular: Normal rate and regular rhythm. Exam reveals no gallop and no friction rub.  No murmur heard. Pulmonary/Chest: Effort normal and breath sounds normal. No respiratory distress. She has no wheezes. She has no rales.  No complaint of chest pain or shortness of breath.  No palpable crepitus or subcu air in the chest.  Abdomen soft benign  Abdominal: Soft. Bowel sounds are normal. She exhibits no distension. There is no tenderness. There is no rebound.  Musculoskeletal: Normal range of motion.  Soft tissue swelling around the patella.  Extensor mechanism intact  Neurological: She is alert and oriented to person, place, and time.  Skin: Skin is warm and dry. No rash noted.  Psychiatric: She has a normal mood and affect. Her behavior is normal.     ED Treatments / Results  Labs (all labs ordered are listed, but only abnormal results are displayed) Labs Reviewed  CBC WITH DIFFERENTIAL/PLATELET - Abnormal; Notable for the following components:      Result Value   MCV 104.6 (*)    MCH 35.0 (*)    All other components within normal limits  BASIC METABOLIC PANEL - Abnormal; Notable for the following components:   BUN <5 (*)    Creatinine,  Ser 0.41 (*)    All other components within normal limits    EKG None  Radiology Ct Head Wo Contrast  Result Date: 10/15/2017 CLINICAL DATA:  Fall with facial trauma EXAM: CT HEAD WITHOUT CONTRAST CT MAXILLOFACIAL WITHOUT CONTRAST CT CERVICAL SPINE WITHOUT CONTRAST TECHNIQUE: Multidetector CT imaging of the head, cervical spine, and maxillofacial structures were performed using the standard protocol without intravenous contrast. Multiplanar CT image reconstructions of the cervical spine and maxillofacial structures were also generated. COMPARISON:  None. FINDINGS: CT HEAD FINDINGS Brain: There is no mass, hemorrhage or extra-axial collection. The size and configuration of the ventricles and extra-axial CSF spaces are normal. There is no acute or chronic infarction. There is hypoattenuation of the periventricular white  matter, most commonly indicating chronic ischemic microangiopathy. Vascular: Atherosclerotic calcification of the vertebral and internal carotid arteries at the skull base. No abnormal hyperdensity of the major intracranial arteries or dural venous sinuses. Skull: The visualized skull base, calvarium and extracranial soft tissues are normal. CT MAXILLOFACIAL FINDINGS Osseous: --Complex facial fracture types: No LeFort, zygomaticomaxillary complex or nasoorbitoethmoidal fracture. --Simple fracture types: Mildly leftward lead displaced fractures of the nasal bones. --Mandible: No fracture or dislocation. Orbits: The globes are intact. Normal appearance of the intra- and extraconal fat. Symmetric extraocular muscles and optic nerves. Sinuses: Frothy secretions in the right maxillary sinus. Soft tissues: Normal visualized extracranial soft tissues. CT CERVICAL SPINE FINDINGS Alignment: Grade 1 anterolisthesis at C4-C5. Skull base and vertebrae: No acute fracture. Soft tissues and spinal canal: No prevertebral fluid or swelling. No visible canal hematoma. Disc levels: Disc space narrowing with  uncovertebral hypertrophy at C5-C6. No spinal canal stenosis. Upper chest: Incompletely visualized left-sided pneumothorax. Other: Normal visualized paraspinal cervical soft tissues. IMPRESSION: 1. No acute intracranial abnormality. 2. No acute fracture of the cervical spine. 3. Incompletely visualized left apical pneumothorax. Chest CT is recommended to assess for rib fracture and to better characterize the extent of the pneumothorax. 4. Slightly displaced nasal bone fracture. These results were called by telephone at the time of interpretation on 10/15/2017 at 8:30 pm to [facility] [place], who verbally acknowledged these results. Electronically Signed   By: Deatra Robinson M.D.   On: 10/15/2017 20:33   Ct Chest Wo Contrast  Result Date: 10/15/2017 CLINICAL DATA:  Left apical pneumothorax seen on cervical spine CT. EXAM: CT CHEST WITHOUT CONTRAST TECHNIQUE: Multidetector CT imaging of the chest was performed following the standard protocol without IV contrast. COMPARISON:  Same day cervical spine CT FINDINGS: Cardiovascular: Conventional branch pattern of the great vessels with proximal atherosclerosis. Ectasia of the ascending thoracic aorta without aneurysm measuring up to 3.3 cm. Mild thoracic aortic atherosclerosis. Left main and three-vessel coronary arteriosclerosis is noted of the normal sized heart. No pericardial effusion or thickening. Mediastinum/Nodes: No adenopathy or mediastinal shift. Esophagus is unremarkable. Patent trachea and mainstem bronchi. Unremarkable thyroid gland without thyromegaly or mass. Lungs/Pleura: Small less than 5% left apical and tiny basilar pneumothoraces. No tension noted. Centrilobular emphysema is seen bilaterally with small bullous emphysematous disease noted in the right upper lobe. Biapical pleuroparenchymal and subpleural opacities likely reflect areas of atelectasis and/or scarring. Probable tiny granuloma in the left lower lobe series 4/83. No effusion. Upper Abdomen: No  acute abnormality. Musculoskeletal: Osteopenic appearance of the bony thorax with mild degenerative change of the lower thoracic spine. No acute nor suspicious osseous abnormalities. IMPRESSION: 1. Small less than 5% left apical and tiny basilar pneumothoraces without tension or mediastinal shift are confirmed as suggested on same day cervical spine CT. 2. Centrilobular emphysema with right upper lobe bullous change. 3. Left main and three-vessel coronary arteriosclerosis. 4. Nonaneurysmal atherosclerotic aorta. Aortic Atherosclerosis (ICD10-I70.0) and Emphysema (ICD10-J43.9). Electronically Signed   By: Tollie Eth M.D.   On: 10/15/2017 21:18   Ct Cervical Spine Wo Contrast  Result Date: 10/15/2017 CLINICAL DATA:  Fall with facial trauma EXAM: CT HEAD WITHOUT CONTRAST CT MAXILLOFACIAL WITHOUT CONTRAST CT CERVICAL SPINE WITHOUT CONTRAST TECHNIQUE: Multidetector CT imaging of the head, cervical spine, and maxillofacial structures were performed using the standard protocol without intravenous contrast. Multiplanar CT image reconstructions of the cervical spine and maxillofacial structures were also generated. COMPARISON:  None. FINDINGS: CT HEAD FINDINGS Brain: There is no mass, hemorrhage or  extra-axial collection. The size and configuration of the ventricles and extra-axial CSF spaces are normal. There is no acute or chronic infarction. There is hypoattenuation of the periventricular white matter, most commonly indicating chronic ischemic microangiopathy. Vascular: Atherosclerotic calcification of the vertebral and internal carotid arteries at the skull base. No abnormal hyperdensity of the major intracranial arteries or dural venous sinuses. Skull: The visualized skull base, calvarium and extracranial soft tissues are normal. CT MAXILLOFACIAL FINDINGS Osseous: --Complex facial fracture types: No LeFort, zygomaticomaxillary complex or nasoorbitoethmoidal fracture. --Simple fracture types: Mildly leftward lead  displaced fractures of the nasal bones. --Mandible: No fracture or dislocation. Orbits: The globes are intact. Normal appearance of the intra- and extraconal fat. Symmetric extraocular muscles and optic nerves. Sinuses: Frothy secretions in the right maxillary sinus. Soft tissues: Normal visualized extracranial soft tissues. CT CERVICAL SPINE FINDINGS Alignment: Grade 1 anterolisthesis at C4-C5. Skull base and vertebrae: No acute fracture. Soft tissues and spinal canal: No prevertebral fluid or swelling. No visible canal hematoma. Disc levels: Disc space narrowing with uncovertebral hypertrophy at C5-C6. No spinal canal stenosis. Upper chest: Incompletely visualized left-sided pneumothorax. Other: Normal visualized paraspinal cervical soft tissues. IMPRESSION: 1. No acute intracranial abnormality. 2. No acute fracture of the cervical spine. 3. Incompletely visualized left apical pneumothorax. Chest CT is recommended to assess for rib fracture and to better characterize the extent of the pneumothorax. 4. Slightly displaced nasal bone fracture. These results were called by telephone at the time of interpretation on 10/15/2017 at 8:30 pm to [facility] [place], who verbally acknowledged these results. Electronically Signed   By: Deatra Robinson M.D.   On: 10/15/2017 20:33   Dg Knee Complete 4 Views Left  Result Date: 10/15/2017 CLINICAL DATA:  Knee pain after falling directly on it on concrete. EXAM: LEFT KNEE - COMPLETE 4+ VIEW COMPARISON:  None. FINDINGS: Acute, transverse, nondisplaced fracture through the mid patella. Large joint effusion. Joint spaces are preserved. Osteopenia. Vascular calcifications. IMPRESSION: Nondisplaced fracture of the mid patella.  Large joint effusion. Electronically Signed   By: Obie Dredge M.D.   On: 10/15/2017 20:43   Ct Maxillofacial Wo Contrast  Result Date: 10/15/2017 CLINICAL DATA:  Fall with facial trauma EXAM: CT HEAD WITHOUT CONTRAST CT MAXILLOFACIAL WITHOUT CONTRAST CT  CERVICAL SPINE WITHOUT CONTRAST TECHNIQUE: Multidetector CT imaging of the head, cervical spine, and maxillofacial structures were performed using the standard protocol without intravenous contrast. Multiplanar CT image reconstructions of the cervical spine and maxillofacial structures were also generated. COMPARISON:  None. FINDINGS: CT HEAD FINDINGS Brain: There is no mass, hemorrhage or extra-axial collection. The size and configuration of the ventricles and extra-axial CSF spaces are normal. There is no acute or chronic infarction. There is hypoattenuation of the periventricular white matter, most commonly indicating chronic ischemic microangiopathy. Vascular: Atherosclerotic calcification of the vertebral and internal carotid arteries at the skull base. No abnormal hyperdensity of the major intracranial arteries or dural venous sinuses. Skull: The visualized skull base, calvarium and extracranial soft tissues are normal. CT MAXILLOFACIAL FINDINGS Osseous: --Complex facial fracture types: No LeFort, zygomaticomaxillary complex or nasoorbitoethmoidal fracture. --Simple fracture types: Mildly leftward lead displaced fractures of the nasal bones. --Mandible: No fracture or dislocation. Orbits: The globes are intact. Normal appearance of the intra- and extraconal fat. Symmetric extraocular muscles and optic nerves. Sinuses: Frothy secretions in the right maxillary sinus. Soft tissues: Normal visualized extracranial soft tissues. CT CERVICAL SPINE FINDINGS Alignment: Grade 1 anterolisthesis at C4-C5. Skull base and vertebrae: No acute fracture. Soft tissues  and spinal canal: No prevertebral fluid or swelling. No visible canal hematoma. Disc levels: Disc space narrowing with uncovertebral hypertrophy at C5-C6. No spinal canal stenosis. Upper chest: Incompletely visualized left-sided pneumothorax. Other: Normal visualized paraspinal cervical soft tissues. IMPRESSION: 1. No acute intracranial abnormality. 2. No acute  fracture of the cervical spine. 3. Incompletely visualized left apical pneumothorax. Chest CT is recommended to assess for rib fracture and to better characterize the extent of the pneumothorax. 4. Slightly displaced nasal bone fracture. These results were called by telephone at the time of interpretation on 10/15/2017 at 8:30 pm to [facility], [place], who verbally acknowledged these results. Electronically Signed   By: Deatra Robinson M.D.   On: 10/15/2017 20:33    Procedures Procedures (including critical care time)  Medications Ordered in ED Medications  tetanus & diphtheria toxoids (adult) (TENIVAC) injection 0.5 mL (has no administration in time range)  ibuprofen (ADVIL,MOTRIN) tablet 800 mg (800 mg Oral Given 10/15/17 1939)     Initial Impression / Assessment and Plan / ED Course  I have reviewed the triage vital signs and the nursing notes.  Pertinent labs & imaging results that were available during my care of the patient were reviewed by me and considered in my medical decision making (see chart for details).   CT scan of head and maxillofacial, as well as CT neck obtained.  Has nasal fracture.  No other extra axial fluid or blood collection.  No additional fractures.  Possible apical pneumothorax noted.  CT of chest recommended.  She has small apical pneumothorax on chest CT without rib fractures or other acute findings.  Has nondisplaced transverse patellar fracture.  Patient is awake and alert and tolerating this quite well she declines pain medication she has no symptoms related to her chest.  The pneumothorax was essentially an incidental finding.  I discussed this briefly with Dr. Alvan Dame of trauma services.  I did not ask him to evaluate this patient.  Patient essentially states that she feels "fine" and no intention of staying in the hospital.  Dr. Alvan Dame and I are in complete agreement that she is quite stable and could be safely discharged home to return with any worsening or any  pulmonary symptoms whatsoever.  She has seen Dr. August Saucer in the past for a wrist fracture and would like to see him regarding her patellar fracture.  She is placed in a knee immobilizer.  She is stable on crutches.  She will be nonweightbearing.  She can remove the immobilizer for sleeping bathing and toileting.  Of asked her to not bear weight whatsoever until seen by orthopedics.  She declines pain medications here.  Given a prescription for tramadol at home should she need to intimately ice the knee.  Expectant management for her nasal fracture.  Final Clinical Impressions(s) / ED Diagnoses   Final diagnoses:  Closed fracture of nasal bone, initial encounter  Closed nondisplaced transverse fracture of left patella, initial encounter  Traumatic pneumothorax, initial encounter    ED Discharge Orders        Ordered    traMADol (ULTRAM) 50 MG tablet  Every 6 hours PRN     10/15/17 2245       Rolland Porter, MD 10/15/17 2254

## 2017-10-15 NOTE — ED Notes (Signed)
ED Provider at bedside. 

## 2017-10-15 NOTE — ED Notes (Signed)
EDP at bedside  

## 2017-10-15 NOTE — ED Triage Notes (Signed)
Patient tripped over a fence while picking dill.  She states she was stuck on the cement after falling.  She fell forward and hit nose on the cement.  No LOC.  Full recall of the incident.  She takes a baby aspirin, no blood thinners.

## 2017-10-15 NOTE — Discharge Instructions (Addendum)
Nasal wounds daily and apply antibiotic ointment and bandage Wear immobilizer when walking with crutches.  Do not bear weight on your leg until seen by Dr. August Saucerean If Tylenol or Motrin do not control your pain, you may use tramadol You have a small collection of air outside of your lung called a pneumothorax If you have any shortness of breath, chest pain, or other symptoms recheck here immediately

## 2017-10-16 ENCOUNTER — Ambulatory Visit (INDEPENDENT_AMBULATORY_CARE_PROVIDER_SITE_OTHER): Payer: Medicare HMO | Admitting: Surgery

## 2017-10-16 ENCOUNTER — Encounter (INDEPENDENT_AMBULATORY_CARE_PROVIDER_SITE_OTHER): Payer: Self-pay | Admitting: Surgery

## 2017-10-16 ENCOUNTER — Other Ambulatory Visit (INDEPENDENT_AMBULATORY_CARE_PROVIDER_SITE_OTHER): Payer: Self-pay

## 2017-10-16 ENCOUNTER — Ambulatory Visit (INDEPENDENT_AMBULATORY_CARE_PROVIDER_SITE_OTHER): Payer: Self-pay

## 2017-10-16 VITALS — BP 89/63 | HR 86 | Ht 65.0 in | Wt 95.0 lb

## 2017-10-16 DIAGNOSIS — S82032G Displaced transverse fracture of left patella, subsequent encounter for closed fracture with delayed healing: Secondary | ICD-10-CM

## 2017-10-16 DIAGNOSIS — M25531 Pain in right wrist: Secondary | ICD-10-CM

## 2017-10-16 NOTE — Progress Notes (Signed)
Orthopedic Tech Progress Note Patient Details:  Kristina OuKathleen M Dennis 23-Dec-1945 [id number]  Ortho Devices Type of Ortho Device: Knee Immobilizer Ortho Device/Splint Location: lle Ortho Device/Splint Interventions: Ordered, Application, Adjustment   Post Interventions Patient Tolerated: Well Instructions Provided: Care of device, Adjustment of device   Trinna PostMartinez, Gracelee Stemmler J 10/16/2017, 12:10 AM

## 2017-10-16 NOTE — Progress Notes (Signed)
Office Visit Note   Patient: Kristina Dennis           Date of Birth: 1945/06/05           MRN: [id number] Visit Date: 10/16/2017              Requested by: Marva Panda, NP [facility] [address] [place], [place] [address] PCP: Marva Panda, NP   Assessment & Plan: Visit Diagnoses:  1. Pain in right wrist   2. Displaced transverse fracture of left patella, subsequent encounter for closed fracture with delayed healing     Plan: I did review x-rays of the left knee with Dr. Dorene Grebe today.  He recommended open reduction internal fixation procedure.  We will see if we can posterior for next Monday.  Patient is scheduled to see her primary care physician today and I did give her copies of the CT chest that did show small less than 5% left apical and tiny basilar pneumothoraces.  No tension noted.  We will need to get clearance from them saying that she is okay to have surgery with Dr. August Saucer next week.  We also did get patient appointment to see Dr. Jearld Fenton ENT physician next Wednesday but we may need to see if we can get that appointment rescheduled if she indeed does have surgery with Dr. August Saucer.  Patient was put in a longer knee immobilizer today.  She was given instructions to elevate her foot above heart level and to ice as needed.  I think she does have a right wrist sprain versus contusion and she does have a removable wrist splint at home that she can use.  Follow-Up Instructions: Return for 1 week postop with Dr. August Saucer.   Orders:  Orders Placed This Encounter  Procedures  . XR Wrist Complete Right   No orders of the defined types were placed in this encounter.     Procedures: No procedures performed   Clinical Data: No additional findings.   Subjective: Chief Complaint  Patient presents with  . Left Patella Fracture    HPI 72 year old female comes in today with complaints of left knee pain, right wrist pain.  Patient suffered a fall  yesterday when her foot tripped over a small gait and she suffered a direct impact to her left knee.  She also injured her right wrist and suffered a nasal fracture.  She was seen in the emergency room and had CT scans of her head, neck and chest.  CT head showed mildly leftward displaced fractures of the nasal bones.  CT chest showed: FINDINGS: Cardiovascular: Conventional branch pattern of the great vessels with proximal atherosclerosis. Ectasia of the ascending thoracic aorta without aneurysm measuring up to 3.3 cm. Mild thoracic aortic atherosclerosis. Left main and three-vessel coronary arteriosclerosis is noted of the normal sized heart. No pericardial effusion or thickening.  Mediastinum/Nodes: No adenopathy or mediastinal shift. Esophagus is unremarkable. Patent trachea and mainstem bronchi. Unremarkable thyroid gland without thyromegaly or mass.  Lungs/Pleura: Small less than 5% left apical and tiny basilar pneumothoraces. No tension noted. Centrilobular emphysema is seen bilaterally with small bullous emphysematous disease noted in the right upper lobe. Biapical pleuroparenchymal and subpleural opacities likely reflect areas of atelectasis and/or scarring. Probable tiny granuloma in the left lower lobe series 4/83. No effusion.  Upper Abdomen: No acute abnormality.  Musculoskeletal: Osteopenic appearance of the bony thorax with mild degenerative change of the lower thoracic spine. No acute nor suspicious osseous abnormalities.  IMPRESSION: 1. Small less than 5% left apical and tiny basilar pneumothoraces without tension or mediastinal shift are confirmed as suggested on same day cervical spine CT. 2. Centrilobular emphysema with right upper lobe bullous change. 3. Left main and three-vessel coronary arteriosclerosis. 4. Nonaneurysmal atherosclerotic aorta.  Patient states that for the pneumothoraces that was seen on CT she was not advised to follow-up with her  primary care physician and was told that if she had any pain or difficulty breathing that she should return to the emergency room.  She states that she is not having any issues with breathing through her nose or any signs of respiratory distress.  She does have some chest soreness from the impact of her fall.  Regards to her knee she was put in a knee immobilizer and advised follow-up with orthopedics.  Left knee x-ray showed:  EXAM: LEFT KNEE - COMPLETE 4+ VIEW  COMPARISON:  None.  FINDINGS: Acute, transverse, nondisplaced fracture through the mid patella. Large joint effusion. Joint spaces are preserved. Osteopenia. Vascular calcifications.  IMPRESSION: Nondisplaced fracture of the mid patella.  Large joint effusion.   Review of Systems No current cardiac pulmonary GI GU issues.  Patient denies difficulty breathing through her nose where she does have a nasal fracture.  Objective: Vital Signs: BP (!) 89/63   Pulse 86   Ht 5\' 5"  (1.651 m)   Wt 95 lb (43.1 kg)   LMP 05/03/1987   BMI 15.81 kg/m   Physical Exam  Constitutional: She is oriented to person, place, and time. No distress.  HENT:  Head: Normocephalic.  Patient does have bruising around the nasal area and some swelling.    Eyes: Pupils are equal, round, and reactive to light. EOM are normal.  Neck: Normal range of motion.  Pulmonary/Chest: No respiratory distress.  Musculoskeletal:  No respiratory distress.  Right wrist he does have good range of motion but with discomfort.  She is tender over the ulnar styloid.  Mild wrist swelling. Left knee does have a large effusion.  Patella is obviously tender.  Small abrasion anterior knee.  Calf nontender.  Neurovascular intact.   Neurological: She is alert and oriented to person, place, and time.  Skin: Skin is warm and dry.  Psychiatric: She has a normal mood and affect.    Ortho Exam  Specialty Comments:  No specialty comments available.  Imaging: Ct Head Wo  Contrast  Result Date: 10/15/2017 CLINICAL DATA:  Fall with facial trauma EXAM: CT HEAD WITHOUT CONTRAST CT MAXILLOFACIAL WITHOUT CONTRAST CT CERVICAL SPINE WITHOUT CONTRAST TECHNIQUE: Multidetector CT imaging of the head, cervical spine, and maxillofacial structures were performed using the standard protocol without intravenous contrast. Multiplanar CT image reconstructions of the cervical spine and maxillofacial structures were also generated. COMPARISON:  None. FINDINGS: CT HEAD FINDINGS Brain: There is no mass, hemorrhage or extra-axial collection. The size and configuration of the ventricles and extra-axial CSF spaces are normal. There is no acute or chronic infarction. There is hypoattenuation of the periventricular white matter, most commonly indicating chronic ischemic microangiopathy. Vascular: Atherosclerotic calcification of the vertebral and internal carotid arteries at the skull base. No abnormal hyperdensity of the major intracranial arteries or dural venous sinuses. Skull: The visualized skull base, calvarium and extracranial soft tissues are normal. CT MAXILLOFACIAL FINDINGS Osseous: --Complex facial fracture types: No LeFort, zygomaticomaxillary complex or nasoorbitoethmoidal fracture. --Simple fracture types: Mildly leftward lead displaced fractures of the nasal bones. --Mandible: No fracture or dislocation. Orbits: The globes are intact. Normal  appearance of the intra- and extraconal fat. Symmetric extraocular muscles and optic nerves. Sinuses: Frothy secretions in the right maxillary sinus. Soft tissues: Normal visualized extracranial soft tissues. CT CERVICAL SPINE FINDINGS Alignment: Grade 1 anterolisthesis at C4-C5. Skull base and vertebrae: No acute fracture. Soft tissues and spinal canal: No prevertebral fluid or swelling. No visible canal hematoma. Disc levels: Disc space narrowing with uncovertebral hypertrophy at C5-C6. No spinal canal stenosis. Upper chest: Incompletely visualized  left-sided pneumothorax. Other: Normal visualized paraspinal cervical soft tissues. IMPRESSION: 1. No acute intracranial abnormality. 2. No acute fracture of the cervical spine. 3. Incompletely visualized left apical pneumothorax. Chest CT is recommended to assess for rib fracture and to better characterize the extent of the pneumothorax. 4. Slightly displaced nasal bone fracture. These results were called by telephone at the time of interpretation on 10/15/2017 at 8:30 pm to [facility], who verbally acknowledged these results. Electronically Signed   By: Deatra RobinsonKevin  Herman M.D.   On: 10/15/2017 20:33   Ct Chest Wo Contrast  Result Date: 10/15/2017 CLINICAL DATA:  Left apical pneumothorax seen on cervical spine CT. EXAM: CT CHEST WITHOUT CONTRAST TECHNIQUE: Multidetector CT imaging of the chest was performed following the standard protocol without IV contrast. COMPARISON:  Same day cervical spine CT FINDINGS: Cardiovascular: Conventional branch pattern of the great vessels with proximal atherosclerosis. Ectasia of the ascending thoracic aorta without aneurysm measuring up to 3.3 cm. Mild thoracic aortic atherosclerosis. Left main and three-vessel coronary arteriosclerosis is noted of the normal sized heart. No pericardial effusion or thickening. Mediastinum/Nodes: No adenopathy or mediastinal shift. Esophagus is unremarkable. Patent trachea and mainstem bronchi. Unremarkable thyroid gland without thyromegaly or mass. Lungs/Pleura: Small less than 5% left apical and tiny basilar pneumothoraces. No tension noted. Centrilobular emphysema is seen bilaterally with small bullous emphysematous disease noted in the right upper lobe. Biapical pleuroparenchymal and subpleural opacities likely reflect areas of atelectasis and/or scarring. Probable tiny granuloma in the left lower lobe series 4/83. No effusion. Upper Abdomen: No acute abnormality. Musculoskeletal: Osteopenic appearance of the bony thorax with mild degenerative  change of the lower thoracic spine. No acute nor suspicious osseous abnormalities. IMPRESSION: 1. Small less than 5% left apical and tiny basilar pneumothoraces without tension or mediastinal shift are confirmed as suggested on same day cervical spine CT. 2. Centrilobular emphysema with right upper lobe bullous change. 3. Left main and three-vessel coronary arteriosclerosis. 4. Nonaneurysmal atherosclerotic aorta. Aortic Atherosclerosis (ICD10-I70.0) and Emphysema (ICD10-J43.9). Electronically Signed   By: Tollie Ethavid  Kwon M.D.   On: 10/15/2017 21:18   Ct Cervical Spine Wo Contrast  Result Date: 10/15/2017 CLINICAL DATA:  Fall with facial trauma EXAM: CT HEAD WITHOUT CONTRAST CT MAXILLOFACIAL WITHOUT CONTRAST CT CERVICAL SPINE WITHOUT CONTRAST TECHNIQUE: Multidetector CT imaging of the head, cervical spine, and maxillofacial structures were performed using the standard protocol without intravenous contrast. Multiplanar CT image reconstructions of the cervical spine and maxillofacial structures were also generated. COMPARISON:  None. FINDINGS: CT HEAD FINDINGS Brain: There is no mass, hemorrhage or extra-axial collection. The size and configuration of the ventricles and extra-axial CSF spaces are normal. There is no acute or chronic infarction. There is hypoattenuation of the periventricular white matter, most commonly indicating chronic ischemic microangiopathy. Vascular: Atherosclerotic calcification of the vertebral and internal carotid arteries at the skull base. No abnormal hyperdensity of the major intracranial arteries or dural venous sinuses. Skull: The visualized skull base, calvarium and extracranial soft tissues are normal. CT MAXILLOFACIAL FINDINGS Osseous: --Complex facial fracture types: No  LeFort, zygomaticomaxillary complex or nasoorbitoethmoidal fracture. --Simple fracture types: Mildly leftward lead displaced fractures of the nasal bones. --Mandible: No fracture or dislocation. Orbits: The globes are  intact. Normal appearance of the intra- and extraconal fat. Symmetric extraocular muscles and optic nerves. Sinuses: Frothy secretions in the right maxillary sinus. Soft tissues: Normal visualized extracranial soft tissues. CT CERVICAL SPINE FINDINGS Alignment: Grade 1 anterolisthesis at C4-C5. Skull base and vertebrae: No acute fracture. Soft tissues and spinal canal: No prevertebral fluid or swelling. No visible canal hematoma. Disc levels: Disc space narrowing with uncovertebral hypertrophy at C5-C6. No spinal canal stenosis. Upper chest: Incompletely visualized left-sided pneumothorax. Other: Normal visualized paraspinal cervical soft tissues. IMPRESSION: 1. No acute intracranial abnormality. 2. No acute fracture of the cervical spine. 3. Incompletely visualized left apical pneumothorax. Chest CT is recommended to assess for rib fracture and to better characterize the extent of the pneumothorax. 4. Slightly displaced nasal bone fracture. These results were called by telephone at the time of interpretation on 10/15/2017 at 8:30 pm to [facility], [facility], who verbally acknowledged these results. Electronically Signed   By: Deatra Robinson M.D.   On: 10/15/2017 20:33   Dg Knee Complete 4 Views Left  Result Date: 10/15/2017 CLINICAL DATA:  Knee pain after falling directly on it on concrete. EXAM: LEFT KNEE - COMPLETE 4+ VIEW COMPARISON:  None. FINDINGS: Acute, transverse, nondisplaced fracture through the mid patella. Large joint effusion. Joint spaces are preserved. Osteopenia. Vascular calcifications. IMPRESSION: Nondisplaced fracture of the mid patella.  Large joint effusion. Electronically Signed   By: Obie Dredge M.D.   On: 10/15/2017 20:43   Ct Maxillofacial Wo Contrast  Result Date: 10/15/2017 CLINICAL DATA:  Fall with facial trauma EXAM: CT HEAD WITHOUT CONTRAST CT MAXILLOFACIAL WITHOUT CONTRAST CT CERVICAL SPINE WITHOUT CONTRAST TECHNIQUE: Multidetector CT imaging of the head, cervical spine, and  maxillofacial structures were performed using the standard protocol without intravenous contrast. Multiplanar CT image reconstructions of the cervical spine and maxillofacial structures were also generated. COMPARISON:  None. FINDINGS: CT HEAD FINDINGS Brain: There is no mass, hemorrhage or extra-axial collection. The size and configuration of the ventricles and extra-axial CSF spaces are normal. There is no acute or chronic infarction. There is hypoattenuation of the periventricular white matter, most commonly indicating chronic ischemic microangiopathy. Vascular: Atherosclerotic calcification of the vertebral and internal carotid arteries at the skull base. No abnormal hyperdensity of the major intracranial arteries or dural venous sinuses. Skull: The visualized skull base, calvarium and extracranial soft tissues are normal. CT MAXILLOFACIAL FINDINGS Osseous: --Complex facial fracture types: No LeFort, zygomaticomaxillary complex or nasoorbitoethmoidal fracture. --Simple fracture types: Mildly leftward lead displaced fractures of the nasal bones. --Mandible: No fracture or dislocation. Orbits: The globes are intact. Normal appearance of the intra- and extraconal fat. Symmetric extraocular muscles and optic nerves. Sinuses: Frothy secretions in the right maxillary sinus. Soft tissues: Normal visualized extracranial soft tissues. CT CERVICAL SPINE FINDINGS Alignment: Grade 1 anterolisthesis at C4-C5. Skull base and vertebrae: No acute fracture. Soft tissues and spinal canal: No prevertebral fluid or swelling. No visible canal hematoma. Disc levels: Disc space narrowing with uncovertebral hypertrophy at C5-C6. No spinal canal stenosis. Upper chest: Incompletely visualized left-sided pneumothorax. Other: Normal visualized paraspinal cervical soft tissues. IMPRESSION: 1. No acute intracranial abnormality. 2. No acute fracture of the cervical spine. 3. Incompletely visualized left apical pneumothorax. Chest CT is  recommended to assess for rib fracture and to better characterize the extent of the pneumothorax. 4. Slightly displaced nasal bone  fracture. These results were called by telephone at the time of interpretation on 10/15/2017 at 8:30 pm to [facility], [facility], who verbally acknowledged these results. Electronically Signed   By: Deatra Robinson M.D.   On: 10/15/2017 20:33     PMFS History: Patient Active Problem List   Diagnosis Date Noted  . Excessive drinking of alcohol 06/18/2014  . Partial small bowel obstruction (HCC) 06/16/2014  . Diarrhea 06/16/2014  . Urethral spasm 06/16/2014  . Palpitations 04/02/2011   Past Medical History:  Diagnosis Date  . Arthritis   . Colitis 2003   per path report from 2003: non-specific.  normal colonoscopy in 04/2011.   Marland Kitchen Hypertension   . Osteoporosis   . Palpitations   . Pneumonia    hx  . Rhinitis     Family History  Problem Relation Age of Onset  . Hypertension Father   . Arrhythmia Mother   . Cancer Sister        rectal   . Rectal cancer Sister 38  . Colon cancer Sister 41    Past Surgical History:  Procedure Laterality Date  . BUNIONECTOMY Left   . DILATION AND CURETTAGE OF UTERUS    . KNEE SURGERY Right    arthroscopy  . ORIF WRIST FRACTURE Left 12/13/2013   Procedure: OPEN REDUCTION INTERNAL FIXATION (ORIF) WRIST FRACTURE;  Surgeon: Cammy Copa, MD;  Location: [facility] OR;  Service: Orthopedics;  Laterality: Left;  . TONSILLECTOMY    . TUBAL LIGATION     Social History   Occupational History  . Occupation: Retired  Tobacco Use  . Smoking status: Current Some Day Smoker    Packs/day: 0.50    Years: 50.00    Pack years: 25.00    Types: Cigarettes  . Smokeless tobacco: Never Used  Substance and Sexual Activity  . Alcohol use: No    Alcohol/week: 0.0 oz  . Drug use: No  . Sexual activity: Not on file

## 2017-10-20 DIAGNOSIS — S82035A Nondisplaced transverse fracture of left patella, initial encounter for closed fracture: Secondary | ICD-10-CM

## 2017-10-21 ENCOUNTER — Telehealth (INDEPENDENT_AMBULATORY_CARE_PROVIDER_SITE_OTHER): Payer: Self-pay | Admitting: Orthopedic Surgery

## 2017-10-21 NOTE — Telephone Encounter (Signed)
Patient's daughter called stating that they had some questions in regards to pain management.  CB#[phone].  Thank you.

## 2017-10-21 NOTE — Telephone Encounter (Signed)
Please advise. Thanks.  

## 2017-10-22 DIAGNOSIS — S022XXB Fracture of nasal bones, initial encounter for open fracture: Secondary | ICD-10-CM | POA: Insufficient documentation

## 2017-10-22 NOTE — Telephone Encounter (Signed)
I called - she is better  - on mr and tylenol f/u mon

## 2017-10-24 ENCOUNTER — Ambulatory Visit (INDEPENDENT_AMBULATORY_CARE_PROVIDER_SITE_OTHER): Payer: Medicare HMO | Admitting: Orthopedic Surgery

## 2017-10-27 ENCOUNTER — Ambulatory Visit (INDEPENDENT_AMBULATORY_CARE_PROVIDER_SITE_OTHER): Payer: Medicare HMO | Admitting: Orthopedic Surgery

## 2017-10-27 ENCOUNTER — Ambulatory Visit (INDEPENDENT_AMBULATORY_CARE_PROVIDER_SITE_OTHER): Payer: Medicare HMO

## 2017-10-27 ENCOUNTER — Encounter (INDEPENDENT_AMBULATORY_CARE_PROVIDER_SITE_OTHER): Payer: Self-pay | Admitting: Orthopedic Surgery

## 2017-10-27 DIAGNOSIS — S82032G Displaced transverse fracture of left patella, subsequent encounter for closed fracture with delayed healing: Secondary | ICD-10-CM

## 2017-10-27 NOTE — Progress Notes (Signed)
   Post-Op Visit Note   Patient: Kristina Dennis           Date of Birth: 07-31-1945           MRN: [id number] Visit Date: 10/27/2017 PCP: Marva PandaMillsaps, Kimberly, NP   Assessment & Plan:  Chief Complaint:  Chief Complaint  Patient presents with  . Left Knee - Routine Post Op   Visit Diagnoses:  1. Displaced transverse fracture of left patella, subsequent encounter for closed fracture with delayed healing     Plan: Patient presents a week out left patella fracture fixation.  She is been doing well walking with 1 and 2 crutches weightbearing in a knee immobilizer.  On examination the incision is intact.  Radiographs look good.  Plan is to start physical therapy in 1 week with progressive strengthening and range of motion exercises.  I will see her back in 2 weeks for clinical recheck.  No calf tenderness or tenderness behind the kneecap today.  She is having a little bit of hamstring origin tightness from likely altered gait pattern.  Follow-Up Instructions: Return in about 2 weeks (around 11/10/2017).   Orders:  Orders Placed This Encounter  Procedures  . XR Knee 1-2 Views Left   No orders of the defined types were placed in this encounter.   Imaging: Xr Knee 1-2 Views Left  Result Date: 10/27/2017 AP lateral left knee reviewed.  Patella fracture fixation appears to be in good position and alignment with no complicating features.  Calcification of popliteal vessel is noted.   PMFS History: Patient Active Problem List   Diagnosis Date Noted  . Excessive drinking of alcohol 06/18/2014  . Partial small bowel obstruction (HCC) 06/16/2014  . Diarrhea 06/16/2014  . Urethral spasm 06/16/2014  . Palpitations 04/02/2011   Past Medical History:  Diagnosis Date  . Arthritis   . Colitis 2003   per path report from 2003: non-specific.  normal colonoscopy in 04/2011.   Marland Kitchen. Hypertension   . Osteoporosis   . Palpitations   . Pneumonia    hx  . Rhinitis     Family History  Problem  Relation Age of Onset  . Hypertension Father   . Arrhythmia Mother   . Cancer Sister        rectal   . Rectal cancer Sister [id number]  . Colon cancer Sister [id number]    Past Surgical History:  Procedure Laterality Date  . BUNIONECTOMY Left   . DILATION AND CURETTAGE OF UTERUS    . KNEE SURGERY Right    arthroscopy  . ORIF WRIST FRACTURE Left 12/13/2013   Procedure: OPEN REDUCTION INTERNAL FIXATION (ORIF) WRIST FRACTURE;  Surgeon: Cammy CopaGregory Scott Jordie Schreur, MD;  [facility];  Service: Orthopedics;  Laterality: Left;  . TONSILLECTOMY    . TUBAL LIGATION     Social History   Occupational History  . Occupation: Retired  Tobacco Use  . Smoking status: Current Some Day Smoker    Packs/day: 0.50    Years: 50.00    Pack years: 25.00    Types: Cigarettes  . Smokeless tobacco: Never Used  Substance and Sexual Activity  . Alcohol use: No    Alcohol/week: 0.0 oz  . Drug use: No  . Sexual activity: Not on file

## 2017-10-28 NOTE — Addendum Note (Signed)
Addended byPrescott Parma: Rossie Scarfone on: 10/28/2017 03:45 PM   Modules accepted: Orders

## 2017-11-05 ENCOUNTER — Other Ambulatory Visit: Payer: Self-pay | Admitting: *Deleted

## 2017-11-05 DIAGNOSIS — M810 Age-related osteoporosis without current pathological fracture: Secondary | ICD-10-CM

## 2017-11-10 ENCOUNTER — Ambulatory Visit (INDEPENDENT_AMBULATORY_CARE_PROVIDER_SITE_OTHER): Payer: Medicare HMO

## 2017-11-10 ENCOUNTER — Encounter (INDEPENDENT_AMBULATORY_CARE_PROVIDER_SITE_OTHER): Payer: Self-pay | Admitting: Orthopedic Surgery

## 2017-11-10 ENCOUNTER — Ambulatory Visit (INDEPENDENT_AMBULATORY_CARE_PROVIDER_SITE_OTHER): Payer: Medicare HMO | Admitting: Orthopedic Surgery

## 2017-11-10 DIAGNOSIS — S82032G Displaced transverse fracture of left patella, subsequent encounter for closed fracture with delayed healing: Secondary | ICD-10-CM

## 2017-11-10 NOTE — Progress Notes (Signed)
   Post-Op Visit Note   Patient: Kristina Dennis           Date of Birth: [birth year]           MRN: [id number] Visit Date: 11/10/2017 PCP: Marva PandaMillsaps, Kimberly, NP   Assessment & Plan:  Chief Complaint:  Chief Complaint  Patient presents with  . Left Knee - Routine Post Op   Visit Diagnoses:  1. Displaced transverse fracture of left patella, subsequent encounter for closed fracture with delayed healing     Plan: Patient presents 3 weeks out patella fracture fixation.  She is been doing well.  On exam she can do a straight leg raise with minimal lag.  Radiographs look good.  Incision intact.  Plan at this time is to continue to ambulate with the knee immobilizer for 2 more weeks.  No flexion beyond 75 degrees over the next 3 weeks.  Okay to discontinue the knee immobilizer in 2 weeks but I cautioned her against doing any type of stairs.  I will see her back in 3 weeks with lateral x-ray on return.  Follow-Up Instructions: Return in about 3 weeks (around 12/01/2017).   Orders:  Orders Placed This Encounter  Procedures  . XR Knee 1-2 Views Left   No orders of the defined types were placed in this encounter.   Imaging: Xr Knee 1-2 Views Left  Result Date: 11/10/2017 Lateral left knee patella reviewed.  No fracture or dislocation in the femur or tibia.  Patella fracture transfixed with 2 screws with no displacement.  No change in alignment on the lateral compared to previous radiograph 2 weeks ago.   PMFS History: Patient Active Problem List   Diagnosis Date Noted  . Excessive drinking of alcohol 06/18/2014  . Partial small bowel obstruction (HCC) 06/16/2014  . Diarrhea 06/16/2014  . Urethral spasm 06/16/2014  . Palpitations 04/02/2011   Past Medical History:  Diagnosis Date  . Arthritis   . Colitis 2003   per path report from 2003: non-specific.  normal colonoscopy in 04/2011.   Marland Kitchen. Hypertension   . Osteoporosis   . Palpitations   . Pneumonia    hx  . Rhinitis       Family History  Problem Relation Age of Onset  . Hypertension Father   . Arrhythmia Mother   . Cancer Sister        rectal   . Rectal cancer Sister [id number]  . Colon cancer Sister 660    Past Surgical History:  Procedure Laterality Date  . BUNIONECTOMY Left   . DILATION AND CURETTAGE OF UTERUS    . KNEE SURGERY Right    arthroscopy  . ORIF WRIST FRACTURE Left 12/13/2013   Procedure: OPEN REDUCTION INTERNAL FIXATION (ORIF) WRIST FRACTURE;  Surgeon: Cammy CopaGregory Scott Dean, MD;  [facility];  Service: Orthopedics;  Laterality: Left;  . TONSILLECTOMY    . TUBAL LIGATION     Social History   Occupational History  . Occupation: Retired  Tobacco Use  . Smoking status: Current Some Day Smoker    Packs/day: 0.50    Years: 50.00    Pack years: 25.00    Types: Cigarettes  . Smokeless tobacco: Never Used  Substance and Sexual Activity  . Alcohol use: No    Alcohol/week: 0.0 oz  . Drug use: No  . Sexual activity: Not on file

## 2017-11-11 ENCOUNTER — Other Ambulatory Visit (INDEPENDENT_AMBULATORY_CARE_PROVIDER_SITE_OTHER): Payer: Self-pay | Admitting: Orthopedic Surgery

## 2017-11-11 NOTE — Telephone Encounter (Signed)
Ok to refill 

## 2017-11-11 NOTE — Telephone Encounter (Signed)
y

## 2017-12-03 ENCOUNTER — Other Ambulatory Visit (INDEPENDENT_AMBULATORY_CARE_PROVIDER_SITE_OTHER): Payer: Self-pay | Admitting: Orthopedic Surgery

## 2017-12-05 ENCOUNTER — Ambulatory Visit (INDEPENDENT_AMBULATORY_CARE_PROVIDER_SITE_OTHER): Payer: Medicare HMO | Admitting: Orthopedic Surgery

## 2017-12-05 ENCOUNTER — Encounter (INDEPENDENT_AMBULATORY_CARE_PROVIDER_SITE_OTHER): Payer: Self-pay | Admitting: Orthopedic Surgery

## 2017-12-05 ENCOUNTER — Ambulatory Visit (INDEPENDENT_AMBULATORY_CARE_PROVIDER_SITE_OTHER): Payer: Medicare HMO

## 2017-12-05 DIAGNOSIS — S82032G Displaced transverse fracture of left patella, subsequent encounter for closed fracture with delayed healing: Secondary | ICD-10-CM

## 2017-12-06 ENCOUNTER — Encounter (INDEPENDENT_AMBULATORY_CARE_PROVIDER_SITE_OTHER): Payer: Self-pay | Admitting: Orthopedic Surgery

## 2017-12-06 NOTE — Progress Notes (Signed)
   Post-Op Visit Note   Patient: Kristina Dennis           Date of Birth: May 23, 1945           MRN: [id number] Visit Date: 12/05/2017 PCP: Marva PandaMillsaps, Kimberly, NP   Assessment & Plan:  Chief Complaint:  Chief Complaint  Patient presents with  . Left Knee - Routine Post Op   Visit Diagnoses:  1. Displaced transverse fracture of left patella, subsequent encounter for closed fracture with delayed healing     Plan: Kristina Dennis is now about 7 weeks out left patella fracture fixation.  She is in physical therapy for exercises.  She is doing well.  On examination the incision is intact.  Extensor mechanism is intact.  She can flex past 90 degrees.  Radiographs show good healing of the fracture.  Plan at this time is to continue therapy 3 more weeks.  Follow-up with me as needed.  I think she can resume activity as tolerated after about 6 more weeks.  Follow-Up Instructions: Return if symptoms worsen or fail to improve.   Orders:  Orders Placed This Encounter  Procedures  . XR Knee 1-2 Views Left   No orders of the defined types were placed in this encounter.   Imaging: No results found.  PMFS History: Patient Active Problem List   Diagnosis Date Noted  . Excessive drinking of alcohol 06/18/2014  . Partial small bowel obstruction (HCC) 06/16/2014  . Diarrhea 06/16/2014  . Urethral spasm 06/16/2014  . Palpitations 04/02/2011   Past Medical History:  Diagnosis Date  . Arthritis   . Colitis 2003   per path report from 2003: non-specific.  normal colonoscopy in 04/2011.   Marland Kitchen. Hypertension   . Osteoporosis   . Palpitations   . Pneumonia    hx  . Rhinitis     Family History  Problem Relation Age of Onset  . Hypertension Father   . Arrhythmia Mother   . Cancer Sister        rectal   . Rectal cancer Sister [id number]  . Colon cancer Sister 4460    Past Surgical History:  Procedure Laterality Date  . BUNIONECTOMY Left   . DILATION AND CURETTAGE OF UTERUS    . KNEE SURGERY Right      arthroscopy  . ORIF WRIST FRACTURE Left 12/13/2013   Procedure: OPEN REDUCTION INTERNAL FIXATION (ORIF) WRIST FRACTURE;  Surgeon: Cammy CopaGregory Scott Deshundra Waller, MD;  [facility];  Service: Orthopedics;  Laterality: Left;  . TONSILLECTOMY    . TUBAL LIGATION     Social History   Occupational History  . Occupation: Retired  Tobacco Use  . Smoking status: Current Some Day Smoker    Packs/day: 0.50    Years: 50.00    Pack years: 25.00    Types: Cigarettes  . Smokeless tobacco: Never Used  Substance and Sexual Activity  . Alcohol use: No    Alcohol/week: 0.0 standard drinks  . Drug use: No  . Sexual activity: Not on file

## 2017-12-31 ENCOUNTER — Ambulatory Visit
Admission: RE | Admit: 2017-12-31 | Discharge: 2017-12-31 | Disposition: A | Discharge status: Home / Self Care | Payer: Medicare HMO | Source: Ambulatory Visit | Attending: *Deleted | Admitting: *Deleted

## 2017-12-31 DIAGNOSIS — M810 Age-related osteoporosis without current pathological fracture: Secondary | ICD-10-CM

## 2018-01-14 ENCOUNTER — Ambulatory Visit (INDEPENDENT_AMBULATORY_CARE_PROVIDER_SITE_OTHER): Payer: Medicare HMO

## 2018-01-14 ENCOUNTER — Ambulatory Visit (INDEPENDENT_AMBULATORY_CARE_PROVIDER_SITE_OTHER): Payer: Self-pay

## 2018-01-14 ENCOUNTER — Ambulatory Visit (INDEPENDENT_AMBULATORY_CARE_PROVIDER_SITE_OTHER): Payer: Medicare HMO | Admitting: Orthopedic Surgery

## 2018-01-14 ENCOUNTER — Encounter (INDEPENDENT_AMBULATORY_CARE_PROVIDER_SITE_OTHER): Payer: Self-pay | Admitting: Orthopedic Surgery

## 2018-01-14 DIAGNOSIS — M79642 Pain in left hand: Secondary | ICD-10-CM

## 2018-01-14 DIAGNOSIS — M25511 Pain in right shoulder: Secondary | ICD-10-CM

## 2018-01-14 DIAGNOSIS — M79641 Pain in right hand: Secondary | ICD-10-CM | POA: Diagnosis not present

## 2018-01-14 NOTE — Progress Notes (Signed)
Office Visit Note   Patient: Kristina Dennis           Date of Birth: [birth year]           MRN: [id number] Visit Date: 01/14/2018 Requested by: Kristina Panda, NP [facility] [address] [place], [place] [address] PCP: Kristina Panda, NP  Subjective: Chief Complaint  Patient presents with  . Right Shoulder - Pain  . Left Hand - Pain  . Right Hand - Pain    HPI: Kristina Dennis is a patient with right shoulder and bilateral hand pain.  The right shoulder started since she is been doing therapy for the knee.  She reports some pain with weightbearing.  She also reports bilateral hand and wrist pain right equal to left.  She is left-hand dominant.  She describes difficulty with ADLs and also some decreased grip strength.  She does not want any type of cortisone injections.  She is had no history or of trauma to the right shoulder or bilateral hands.              ROS: All systems reviewed are negative as they relate to the chief complaint within the history of present illness.  Patient denies  fevers or chills.   Assessment & Plan: Visit Diagnoses:  1. Bilateral hand pain   2. 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  She also likely has some degree of right shoulder bursitis with no evidence of frozen shoulder or rotator cuff tear.  Plan at this time is topical anti-inflammatory for the hand.  I think the shoulder is something that we can watch for now.  Therapy would be helpful for the shoulder I wrote her prescription for that to work on some cuff strengthening exercises.  I will see her back as needed  Follow-Up Instructions: Return if symptoms worsen or fail to improve.   Orders:  Orders Placed This Encounter  Procedures  . XR Shoulder Right  . XR Hand Complete Right  . XR Hand Complete Left   No orders of the defined types were placed in this  encounter.     Procedures: No procedures performed   Clinical Data: No additional findings.  Objective: Vital Signs: LMP 05/03/1987   Physical Exam:   Constitutional: Patient appears well-developed HEENT:  Head: Normocephalic Eyes:EOM are normal Neck: Normal range of motion Cardiovascular: Normal rate Pulmonary/chest: Effort normal Neurologic: Patient is alert Skin: Skin is warm Psychiatric: Patient has normal mood and affect    Ortho Exam: Ortho exam demonstrates good cervical spine range of motion.  She has excellent right shoulder range of motion and strength to infraspinatus supinates and subscap muscle testing.  No coarse grinding or crepitus with active or passive range of motion of that right shoulder.  Most of her pain localized to the deltoid region.  AC joint nontender on the right.  No other masses lymph adenopathy or skin changes noted in that shoulder girdle region.  Both hands are examined.  She has positive grind test bilaterally.  Good grip EPL FPL interosseous strength.  Radial pulse intact bilaterally well-healed surgical incision from plate fixation on the left.  Specialty Comments:  No specialty comments available.  Imaging: Xr Hand Complete Left  Result Date: 01/14/2018 AP lateral oblique left hand reviewed.  Plate fixation of distal radius fracture in good position and alignment with no complicating features.  Mild  CMC arthritis but is present affecting the thumb.  No other significant radiocarpal arthritis or phalangeal joint arthritis is present.  No fractures.  Xr Hand Complete Right  Result Date: 01/14/2018 AP lateral oblique right hand reviewed.  CMC arthritis is present.  Osteopenia present.  No fracture dislocation seen.  No significant arthritis in the phalanges or radiocarpal joint.  Xr Shoulder Right  Result Date: 01/14/2018 AP lateral outlet right shoulder reviewed.  Mild AC joint degenerative changes present.  No glenohumeral arthritis  present.  Acromiohumeral distance maintained.  Osteopenia present.  No fracture dislocation.    PMFS History: Patient Active Problem List   Diagnosis Date Noted  . Excessive drinking of alcohol 06/18/2014  . Partial small bowel obstruction (HCC) 06/16/2014  . Diarrhea 06/16/2014  . Urethral spasm 06/16/2014  . Palpitations 04/02/2011   Past Medical History:  Diagnosis Date  . Arthritis   . Colitis 2003   per path report from 2003: non-specific.  normal colonoscopy in 04/2011.   Marland Kitchen. Hypertension   . Osteoporosis   . Palpitations   . Pneumonia    hx  . Rhinitis     Family History  Problem Relation Age of Onset  . Hypertension Father   . Arrhythmia Mother   . Cancer Sister        rectal   . Rectal cancer Sister [id number]  . Colon cancer Sister 4260    Past Surgical History:  Procedure Laterality Date  . BUNIONECTOMY Left   . DILATION AND CURETTAGE OF UTERUS    . KNEE SURGERY Right    arthroscopy  . ORIF WRIST FRACTURE Left 12/13/2013   Procedure: OPEN REDUCTION INTERNAL FIXATION (ORIF) WRIST FRACTURE;  Surgeon: Cammy CopaGregory Scott Dean, MD;  [facility];  Service: Orthopedics;  Laterality: Left;  . TONSILLECTOMY    . TUBAL LIGATION     Social History   Occupational History  . Occupation: Retired  Tobacco Use  . Smoking status: Current Some Day Smoker    Packs/day: 0.50    Years: 50.00    Pack years: 25.00    Types: Cigarettes  . Smokeless tobacco: Never Used  Substance and Sexual Activity  . Alcohol use: No    Alcohol/week: 0.0 standard drinks  . Drug use: No  . Sexual activity: Not on file

## 2018-04-10 ENCOUNTER — Telehealth: Payer: Self-pay

## 2018-04-10 NOTE — Telephone Encounter (Signed)
SENT REFERRAL TO SCHEDULING AND FILED NOTES 

## 2018-06-01 ENCOUNTER — Encounter: Payer: Self-pay | Admitting: Nurse Practitioner

## 2018-06-01 ENCOUNTER — Encounter: Payer: Self-pay | Admitting: Cardiovascular Disease

## 2018-06-01 ENCOUNTER — Ambulatory Visit: Payer: Medicare HMO | Admitting: Cardiovascular Disease

## 2018-06-01 VITALS — BP 118/80 | HR 60 | Ht 65.0 in | Wt 88.1 lb

## 2018-06-01 DIAGNOSIS — I2510 Atherosclerotic heart disease of native coronary artery without angina pectoris: Secondary | ICD-10-CM | POA: Diagnosis not present

## 2018-06-01 NOTE — Patient Instructions (Signed)
Medication Instructions:  Your physician recommends that you continue on your current medications as directed. Please refer to the Current Medication list given to you today.  If you need a refill on your cardiac medications before your next appointment, please call your pharmacy.    Lab work: None Ordered    Testing/Procedures: Your physician has requested that you have a lexiscan myoview. For further information please visit [email or web address] Please follow instruction sheet, as given.     Follow-Up: At [facility], you and your health needs are our priority.  As part of our continuing mission to provide you with exceptional heart care, we have created designated Provider Care Teams.  These Care Teams include your primary Cardiologist (physician) and Advanced Practice Providers (APPs -  Physician Assistants and Nurse Practitioners) who all work together to provide you with the care you need, when you need it. You will need a follow up appointment in:  As needed.  You may see Kristeen Miss, MD or one of the following Advanced Practice Providers on your designated Care Team: Tereso Newcomer, PA-C [place], [place] . Berton Bon, NP

## 2018-06-01 NOTE — Progress Notes (Signed)
Cardiology Office Note:    Date:  06/01/2018   ID:  Kristina Dennis, Kristina Dennis [birth year], MRN [id number]  PCP:  Kristina Beals, NP  Cardiologist:  Kristina Moores, MD  Electrophysiologist:  None   Referring MD: Kristina Beals, NP   Chief Complaint  Patient presents with  . Hypertension      Feb. 10, 2020    Kristina Dennis is a 73 y.o. female with a hx of  HTN.   I met Kristina Dennis in 2012 and have not seen her since then.  She was having issues with palpitations We were asked by Dr. Forde Dandy to see her coronary artery calcifications that were noted incidentally on a chest CT from 09-22-2017.  No hx of CAD ( father died of a brain aneurism, mother died of a broken neck )   Other members died of accidents and cancer.  She denies having any episodes of chest pain.  She does exercise on a regular basis  ( physical therapy and light weights )    Does not do cardio exercise .   Has foot issues/   Takes amlodipine on a PRN basis  - ends up taking it every week or 2 .       Past Medical History:  Diagnosis Date  . Arthritis   . Colitis 2003   per path report from 2003: non-specific.  normal colonoscopy in 04/2011.   Marland Kitchen Hypertension   . Osteoporosis   . Palpitations   . Pneumonia    hx  . Rhinitis     Past Surgical History:  Procedure Laterality Date  . BUNIONECTOMY Left   . DILATION AND CURETTAGE OF UTERUS    . KNEE SURGERY Right    arthroscopy  . ORIF WRIST FRACTURE Left 12/13/2013   Procedure: OPEN REDUCTION INTERNAL FIXATION (ORIF) WRIST FRACTURE;  Surgeon: Meredith Pel, MD;  Location: [facility];  Service: Orthopedics;  Laterality: Left;  . TONSILLECTOMY    . TUBAL LIGATION      Current Medications: Current Meds  Medication Sig  . alendronate (FOSAMAX) 70 MG tablet Take 1 tablet by mouth once a week.  Marland Kitchen amLODipine (NORVASC) 5 MG tablet Take 5 mg by mouth See admin instructions. Take 5 mg by mouth once a day only if B/P is >120/80-85  . Cholecalciferol (VITAMIN D-3  PO) Take 1 capsule by mouth 3 (three) times a week.   Current Facility-Administered Medications for the 06/01/18 encounter (Office Visit) with Kristina Dennis, Kristina Cheng, MD  Medication  . 0.9 %  sodium chloride infusion     Allergies:   Hydrocodone; Azithromycin; Excedrin extra strength [aspirin-acetaminophen-caffeine]; Niacin; Niacin and related; Sulfa antibiotics; Sulfasalazine; and Latex   Social History   Socioeconomic History  . Marital status: Married    Spouse name: Not on file  . Number of children: 2  . Years of education: Not on file  . Highest education level: Not on file  Occupational History  . Occupation: Retired  Scientific laboratory technician  . Financial resource strain: Not on file  . Food insecurity:    Worry: Not on file    Inability: Not on file  . Transportation needs:    Medical: Not on file    Non-medical: Not on file  Tobacco Use  . Smoking status: Current Some Day Smoker    Packs/day: 0.50    Years: 50.00    Pack years: 25.00    Types: Cigarettes  . Smokeless tobacco: Never Used  Substance and Sexual  Activity  . Alcohol use: No    Alcohol/week: 0.0 standard drinks  . Drug use: No  . Sexual activity: Not on file  Lifestyle  . Physical activity:    Days per week: Not on file    Minutes per session: Not on file  . Stress: Not on file  Relationships  . Social connections:    Talks on phone: Not on file    Gets together: Not on file    Attends religious service: Not on file    Active member of club or organization: Not on file    Attends meetings of clubs or organizations: Not on file    Relationship status: Not on file  Other Topics Concern  . Not on file  Social History Narrative  . Not on file     Family History: The patient's family history includes Arrhythmia in her mother; Cancer in her sister; Colon cancer (age of onset: 59) in her sister; Hypertension in her father; Rectal cancer (age of onset: 50) in her sister.  ROS:   Please see the history of present  illness.     All other systems reviewed and are negative.  EKGs/Labs/Other Studies Reviewed:    The following studies were reviewed today:   EKG:   Feb. 10, 2020:   Normal sinus rhythm.  She has Q waves in the inferior and lateral leads which may be due to cardiac position.  She has noted history of pain.  Recent Labs: 10/15/2017: BUN <5; Creatinine, Ser 0.41; Hemoglobin 13.7; Platelets 192; Potassium 4.0; Sodium 137  Recent Lipid Panel No results found for: CHOL, TRIG, HDL, CHOLHDL, VLDL, LDLCALC, LDLDIRECT  Physical Exam:    VS:  BP 118/80   Pulse 60   Ht '5\' 5"'  (1.651 m)   Wt 88 lb 1.9 oz (40 kg)   LMP 05/03/1987   SpO2 93%   BMI 14.66 kg/m     Wt Readings from Last 3 Encounters:  06/01/18 88 lb 1.9 oz (40 kg)  10/16/17 95 lb (43.1 kg)  06/05/16 95 lb (43.1 kg)     GEN: Thin, elderly female, no acute distress distress,   Seems a little anxious  HEENT: Normal NECK: No JVD; No carotid bruits LYMPHATICS: No lymphadenopathy CARDIAC: RRR, no murmurs, rubs, gallops RESPIRATORY:  Clear to auscultation without rales, wheezing or rhonchi  ABDOMEN: Soft, non-tender, non-distended MUSCULOSKELETAL:  No edema; No deformity  SKIN: Warm and dry NEUROLOGIC:  Alert and oriented x 3 PSYCHIATRIC:  Normal affect   ASSESSMENT:    1. Coronary artery calcification seen on CAT scan    PLAN:    In order of problems listed above:  1. 1.  Coronary artery calcifications: Patient was incidentally noted to have coronary artery calcifications on a CT scan from June, 2019.  She does not have any real symptoms.  She does have Q waves in her inferior and lateral leads.  I think will be important to make sure that she does not have coronary artery disease.  Assuming that the [facility] study is normal, we will see her on an as-needed basis.  If it is abnormal then we will need to see her back and schedule heart catheterization..   2. Essential hypertension: [name] takes amlodipine 5 mg  on an as-needed basis.  She ends up taking it every week or 2.  Her blood pressure log with her and she typically takes it if her diastolic pressure is high.  Her systolic blood pressures rarely  are elevated.  Not sure that the as needed amlodipine is doing much for her.  She would do better with a regular exercise program but she has some foot and orthopedic issues and is not able to walk regularly.  I suggested she see a physical therapist and may need to see a podiatrist.  He does need an as needed medication, low-dose diuretic might suit her better.  We will see her back on an as-needed basis.   Medication Adjustments/Labs and Tests Ordered: Current medicines are reviewed at length with the patient today.  Concerns regarding medicines are outlined above.  Orders Placed This Encounter  Procedures  . Myocardial Perfusion Imaging  . EKG 12-Lead   No orders of the defined types were placed in this encounter.   Patient Instructions  Medication Instructions:  Your physician recommends that you continue on your current medications as directed. Please refer to the Current Medication list given to you today.  If you need a refill on your cardiac medications before your next appointment, please call your pharmacy.    Lab work: None Ordered    Testing/Procedures: Your physician has requested that you have a lexiscan myoview. For further information please visit [email or web address]. Please follow instruction sheet, as given.     Follow-Up: At [facility], you and your health needs are our priority.  As part of our continuing mission to provide you with exceptional heart care, we have created designated Provider Care Teams.  These Care Teams include your primary Cardiologist (physician) and Advanced Practice Providers (APPs -  Physician Assistants and Nurse Practitioners) who all work together to provide you with the care you need, when you need it. You will need a follow up  appointment in:  As needed.  You may see Kristina Moores, MD or one of the following Advanced Practice Providers on your designated Care Team: Richardson Dopp, PA-C [place], [place] . Daune Perch, NP      Signed, Kristina Moores, MD  06/01/2018 9:31 AM    [place]

## 2018-06-04 ENCOUNTER — Telehealth (HOSPITAL_COMMUNITY): Payer: Self-pay | Admitting: *Deleted

## 2018-06-04 NOTE — Telephone Encounter (Signed)
Patient given detailed instructions per Myocardial Perfusion Study Information Sheet for the test on 06/09/18 and notified to arrive 15 minutes early and that it is imperative to arrive on time for appointment to keep from having the test rescheduled.  If you need to cancel or reschedule your appointment, please call the office within 24 hours of your appointment. . Patient verbalized understanding. Kristina Dennis

## 2018-06-09 ENCOUNTER — Ambulatory Visit (HOSPITAL_COMMUNITY): Payer: Medicare HMO | Attending: Internal Medicine

## 2018-06-09 DIAGNOSIS — I2510 Atherosclerotic heart disease of native coronary artery without angina pectoris: Secondary | ICD-10-CM | POA: Diagnosis not present

## 2018-06-09 LAB — MYOCARDIAL PERFUSION IMAGING
CHL CUP NUCLEAR SDS: 3
CHL CUP NUCLEAR SRS: 1
CHL CUP NUCLEAR SSS: 4
CSEPPHR: 80 {beats}/min
LV dias vol: 43 mL (ref 46–106)
LV sys vol: 6 mL
Rest HR: 59 {beats}/min
TID: 0.83

## 2018-06-09 MED ORDER — TECHNETIUM TC 99M TETROFOSMIN IV KIT
31.9000 | PACK | Freq: Once | INTRAVENOUS | Status: AC | PRN
  Administered 2018-06-09: 31.9 via INTRAVENOUS
  Filled 2018-06-09: qty 32

## 2018-06-09 MED ORDER — REGADENOSON 0.4 MG/5ML IV SOLN
0.4000 mg | Freq: Once | INTRAVENOUS | Status: AC
  Administered 2018-06-09: 0.4 mg via INTRAVENOUS

## 2018-06-09 MED ORDER — TECHNETIUM TC 99M TETROFOSMIN IV KIT
9.7000 | PACK | Freq: Once | INTRAVENOUS | Status: AC | PRN
  Administered 2018-06-09: 9.7 via INTRAVENOUS
  Filled 2018-06-09: qty 10

## 2018-06-10 ENCOUNTER — Telehealth: Payer: Self-pay | Admitting: Cardiovascular Disease

## 2018-06-10 NOTE — Telephone Encounter (Signed)
Results reviewed with patient who has been advised to follow-up as needed. She verbalized understanding and agreement with plan and thanked me for the call.

## 2018-06-10 NOTE — Telephone Encounter (Signed)
Patient called to return Kristina Dennis call regarding results

## 2018-10-29 ENCOUNTER — Ambulatory Visit: Payer: Medicare HMO | Admitting: Podiatry

## 2018-11-04 ENCOUNTER — Ambulatory Visit (INDEPENDENT_AMBULATORY_CARE_PROVIDER_SITE_OTHER): Payer: Medicare HMO

## 2018-11-04 ENCOUNTER — Ambulatory Visit: Payer: Medicare HMO | Admitting: Podiatry

## 2018-11-04 ENCOUNTER — Encounter: Payer: Self-pay | Admitting: Podiatry

## 2018-11-04 ENCOUNTER — Other Ambulatory Visit: Payer: Self-pay

## 2018-11-04 ENCOUNTER — Other Ambulatory Visit: Payer: Self-pay | Admitting: Podiatry

## 2018-11-04 VITALS — Temp 98.1°F

## 2018-11-04 DIAGNOSIS — M2041 Other hammer toe(s) (acquired), right foot: Secondary | ICD-10-CM

## 2018-11-04 DIAGNOSIS — S60219A Contusion of unspecified wrist, initial encounter: Secondary | ICD-10-CM | POA: Insufficient documentation

## 2018-11-04 DIAGNOSIS — M779 Enthesopathy, unspecified: Secondary | ICD-10-CM | POA: Diagnosis not present

## 2018-11-04 DIAGNOSIS — M2042 Other hammer toe(s) (acquired), left foot: Secondary | ICD-10-CM

## 2018-11-04 DIAGNOSIS — M25539 Pain in unspecified wrist: Secondary | ICD-10-CM | POA: Insufficient documentation

## 2018-11-04 DIAGNOSIS — M79671 Pain in right foot: Secondary | ICD-10-CM

## 2018-11-04 DIAGNOSIS — M79672 Pain in left foot: Secondary | ICD-10-CM

## 2018-11-05 NOTE — Progress Notes (Signed)
Subjective:   Patient ID: Kristina Dennis, female   DOB: 73 y.o.   MRN: [id number]   HPI Patient presents stating she has had a lot of pain in the bottom of both feet and she knows that the bones are exposed and that she walks on them.  States that they are difficult to be comfortable with and she is tried different shoes without relief of symptoms and does not currently smoke and likes to be active   Review of Systems  All other systems reviewed and are negative.       Objective:  Physical Exam Vitals signs and nursing note reviewed.  Constitutional:      Appearance: She is well-developed.  Pulmonary:     Effort: Pulmonary effort is normal.  Musculoskeletal: Normal range of motion.  Skin:    General: Skin is warm.  Neurological:     Mental Status: She is alert.     Neurovascular status intact muscle strength found to be adequate range of motion within normal limits.  Patient is found to have quite a bit of prominence of the metatarsal bones bilateral with inflammation fluid around the bones themselves with patient noted to have moderate digital compression.  Patient has good digital perfusion well oriented x3     Assessment:  Significant diminishment of the fat pad bilateral with exposed bone formation forefoot secondary to unique arch structure with cavus foot structure     Plan:  H&P condition x-rays discussed at great length.  At this point I recommended orthotics and I reviewed with her orthotics to try to take the stress off of her feet and she is referred to ped orthotist to have been made and I have recommended a very soft orthotic with forefoot dispersion padding.  Patient will be seen back to recheck  X-ray indicates that there is moderate decalcification of the feet osteoporosis with a very high cavus foot structure

## 2018-11-16 ENCOUNTER — Other Ambulatory Visit: Payer: Self-pay

## 2018-11-16 ENCOUNTER — Ambulatory Visit (INDEPENDENT_AMBULATORY_CARE_PROVIDER_SITE_OTHER): Payer: Medicare HMO | Admitting: Orthotics

## 2018-11-16 DIAGNOSIS — M79672 Pain in left foot: Secondary | ICD-10-CM

## 2018-11-16 DIAGNOSIS — M779 Enthesopathy, unspecified: Secondary | ICD-10-CM | POA: Diagnosis not present

## 2018-11-16 DIAGNOSIS — M79671 Pain in right foot: Secondary | ICD-10-CM

## 2018-11-16 NOTE — Progress Notes (Signed)
Patient seen today for casting for CMFO; patient presents with fat atrophy and lateral pain at 5th met base b/l.  Plan on LW accomodative device, dancers pad along mets, met bar and offload 5th met base b/l.

## 2018-12-14 ENCOUNTER — Ambulatory Visit: Payer: Medicare HMO | Admitting: Orthotics

## 2018-12-14 ENCOUNTER — Other Ambulatory Visit: Payer: Self-pay

## 2018-12-14 DIAGNOSIS — M79671 Pain in right foot: Secondary | ICD-10-CM

## 2018-12-14 DIAGNOSIS — M2042 Other hammer toe(s) (acquired), left foot: Secondary | ICD-10-CM

## 2018-12-14 DIAGNOSIS — M2041 Other hammer toe(s) (acquired), right foot: Secondary | ICD-10-CM

## 2018-12-14 NOTE — Progress Notes (Signed)
Patient came in today to pick up custom made foot orthotics.  The goals were accomplished and the patient reported no dissatisfaction with said orthotics.  Patient was advised of breakin period and how to report any issues. 

## 2019-04-23 HISTORY — PX: ESOPHAGOGASTRODUODENOSCOPY: SHX1529

## 2019-05-06 ENCOUNTER — Other Ambulatory Visit (HOSPITAL_COMMUNITY): Payer: Self-pay | Admitting: *Deleted

## 2019-05-07 ENCOUNTER — Other Ambulatory Visit: Payer: Self-pay

## 2019-05-07 ENCOUNTER — Ambulatory Visit (HOSPITAL_COMMUNITY)
Admission: RE | Admit: 2019-05-07 | Discharge: 2019-05-07 | Disposition: A | Discharge status: Home / Self Care | Payer: Medicare HMO | Source: Ambulatory Visit | Attending: Endocrinology | Admitting: Endocrinology

## 2019-05-07 DIAGNOSIS — M810 Age-related osteoporosis without current pathological fracture: Secondary | ICD-10-CM | POA: Diagnosis not present

## 2019-05-07 MED ORDER — ZOLEDRONIC ACID 5 MG/100ML IV SOLN
INTRAVENOUS | Status: AC
  Filled 2019-05-07: qty 100

## 2019-05-07 MED ORDER — ZOLEDRONIC ACID 5 MG/100ML IV SOLN
5.0000 mg | Freq: Once | INTRAVENOUS | Status: AC
  Administered 2019-05-07: 5 mg via INTRAVENOUS

## 2019-05-07 NOTE — Discharge Instructions (Signed)

## 2020-02-24 ENCOUNTER — Ambulatory Visit: Payer: Medicare HMO | Admitting: Physician Assistant

## 2020-02-24 ENCOUNTER — Encounter: Payer: Self-pay | Admitting: Physician Assistant

## 2020-02-24 VITALS — BP 128/88 | HR 77 | Ht 65.0 in | Wt 88.5 lb

## 2020-02-24 DIAGNOSIS — R198 Other specified symptoms and signs involving the digestive system and abdomen: Secondary | ICD-10-CM | POA: Diagnosis not present

## 2020-02-24 DIAGNOSIS — R0989 Other specified symptoms and signs involving the circulatory and respiratory systems: Secondary | ICD-10-CM

## 2020-02-24 NOTE — Patient Instructions (Signed)
If you are age 75 or older, your body mass index should be between 23-30. Your Body mass index is 14.73 kg/m. If this is out of the aforementioned range listed, please consider follow up with your Primary Care Provider.  If you are age 56 or younger, your body mass index should be between 19-25. Your Body mass index is 14.73 kg/m. If this is out of the aformentioned range listed, please consider follow up with your Primary Care Provider.   You have been scheduled for an endoscopy. Please follow written instructions given to you at your visit today. If you use inhalers (even only as needed), please bring them with you on the day of your procedure.  Follow up pending the results of your Endoscopy

## 2020-02-24 NOTE — Progress Notes (Signed)
Subjective:    Patient ID: Kristina Dennis, female    DOB: 1946/03/01, 74 y.o.   MRN: [id number]  HPI Kristina Dennis is a 74 year old white female, established with Dr. Carlean Purl who comes in today with complaints of a fullness in her throat "like a foreign object".  She has been having symptoms over the past 18 months. She has prior diagnosis of LPR made by ENT/Dr. Redmond Baseman, whom she last saw in July 2021.  She had been seen for chronic cough.  Laryngoscopy had been done in May 2021, and unremarkable.  She says she was tried on a variety of regimens without improvement in symptoms.  Most recently she had been asked to increase omeprazole from 20 mg to 40 mg daily and says this caused bad abdominal cramping bloating so she stopped taking it. She is convinced that she does not have acid reflux and says that is not what is causing her current symptoms.  She has no complaints of heartburn or indigestion.  She has since seen a naturopath who tested her for candidiasis and apparently this returned positive and she is now completing a course of fluconazole.  She says her cough is gone but she continues to have a fairly constant sensation of fullness or irritation in her throat "like there is something in there."  She has no complaints of dysphagia or odynophagia.  Appetite has been fine, weight has been stable. She has not had prior EGD and is requesting EGD to be done. Last colonoscopy 2018 was normal.  She does have family history of colon cancer in her sister.  Review of Systems Pertinent positive and negative review of systems were noted in the above HPI section.  All other review of systems was otherwise negative.  Outpatient Encounter Medications as of 02/24/2020  Medication Sig  . Ascorbic Acid (VITAMIN C) 1000 MG tablet Take 1,000 mg by mouth.  . Cholecalciferol (VITAMIN D-3 PO) Take 1 capsule by mouth 3 (three) times a week.  . fluconazole (DIFLUCAN) 200 MG tablet   . Magnesium 200 MG TABS Take by  mouth.  . polyethylene glycol powder ([facility] PURELAX) 17 GM/SCOOP powder Purelax 17 gram/dose oral powder  TALE 1 PACKET IN 8-12 OZ BEVERAGE EVERY 12 HOURS UNTIL BOWEL MOVEMENTS BEGIN THEN 1 PACKET  . rosuvastatin (CRESTOR) 20 MG tablet Take 20 mg by mouth daily.  . [DISCONTINUED] albuterol (VENTOLIN HFA) 108 (90 Base) MCG/ACT inhaler Inhale 1-2 puffs into the lungs every 6 (six) hours as needed for wheezing or shortness of breath. (Patient not taking: Reported on 02/24/2020)  . [DISCONTINUED] alendronate (FOSAMAX) 70 MG tablet Take 1 tablet by mouth once a week. (Patient not taking: Reported on 02/24/2020)  . [DISCONTINUED] amLODipine (NORVASC) 5 MG tablet Take 5 mg by mouth See admin instructions. Take 5 mg by mouth once a day only if B/P is >120/80-85 (Patient not taking: Reported on 02/24/2020)  . [DISCONTINUED] azelastine (ASTELIN) 0.1 % nasal spray Place 2 sprays into both nostrils 2 (two) times daily. Use in each nostril as directed (Patient not taking: Reported on 02/24/2020)  . [DISCONTINUED] fluticasone (VERAMYST) 27.5 MCG/SPRAY nasal spray Place 1 spray into the nose daily. (Patient not taking: Reported on 02/24/2020)  . [DISCONTINUED] HYDROcodone-homatropine (HYCODAN) 5-1.5 MG/5ML syrup Take 5 mLs by mouth at bedtime. (Patient not taking: Reported on 02/24/2020)  . [DISCONTINUED] levocetirizine (XYZAL) 5 MG tablet Take 5 mg by mouth every evening. (Patient not taking: Reported on 02/24/2020)  . [DISCONTINUED] meloxicam (MOBIC) 7.5 MG tablet  Take 7.5 mg by mouth daily. (Patient not taking: Reported on 02/24/2020)  . [DISCONTINUED] montelukast (SINGULAIR) 10 MG tablet Take 10 mg by mouth daily. (Patient not taking: Reported on 02/24/2020)  . [DISCONTINUED] omeprazole (PRILOSEC) 20 MG capsule Take 20 mg by mouth daily. (Patient not taking: Reported on 02/24/2020)  . [DISCONTINUED] pneumococcal 23 valent vaccine (PNEUMOVAX 23) 25 MCG/0.5ML injection Pneumovax-23 25 mcg/0.5 mL injection syringe  TO BE  ADMINISTERED BY PHARMACIST FOR IMMUNIZATION  . [DISCONTINUED] Zoster Vaccine Adjuvanted [facility]) injection Shingrix (PF) 50 mcg/0.5 mL intramuscular suspension, kit   Facility-Administered Encounter Medications as of 02/24/2020  Medication  . 0.9 %  sodium chloride infusion   Allergies  Allergen Reactions  . Hydrocodone Nausea And Vomiting  . Amlodipine     Severe all over joint pain  . Azithromycin Nausea Only  . Excedrin Extra Strength [Aspirin-Acetaminophen-Caffeine] Nausea Only    Extreme nausea  . Niacin Nausea And Vomiting  . Niacin And Related Nausea And Vomiting  . Sulfa Antibiotics Nausea And Vomiting and Other (See Comments)    Violently ill  . Sulfasalazine Nausea And Vomiting    Violently ill  . Latex Rash   Patient Active Problem List   Diagnosis Date Noted  . Contusion of wrist 11/04/2018  . Pain in wrist 11/04/2018  . Open fracture of nasal bones 10/22/2017  . Acute diffuse otitis externa of right ear 05/06/2017  . Eustachian tube dysfunction, bilateral 05/06/2017  . Excessive drinking of alcohol 06/18/2014  . Partial small bowel obstruction ([place]) 06/16/2014  . Diarrhea 06/16/2014  . Urethral spasm 06/16/2014  . Palpitations 04/02/2011   Social History   Socioeconomic History  . Marital status: Married    Spouse name: Not on file  . Number of children: 2  . Years of education: Not on file  . Highest education level: Not on file  Occupational History  . Occupation: Retired  Tobacco Use  . Smoking status: Current Some Day Smoker    Packs/day: 0.50    Years: 50.00    Pack years: 25.00    Types: Cigarettes  . Smokeless tobacco: Never Used  Substance and Sexual Activity  . Alcohol use: No    Alcohol/week: 0.0 standard drinks  . Drug use: No  . Sexual activity: Not on file  Other Topics Concern  . Not on file  Social History Narrative  . Not on file   Social Determinants of Health   Financial Resource Strain:   . Difficulty of Paying Living  Expenses: Not on file  Food Insecurity:   . Worried About Charity fundraiser in the Last Year: Not on file  . Ran Out of Food in the Last Year: Not on file  Transportation Needs:   . Lack of Transportation (Medical): Not on file  . Lack of Transportation (Non-Medical): Not on file  Physical Activity:   . Days of Exercise per Week: Not on file  . Minutes of Exercise per Session: Not on file  Stress:   . Feeling of Stress : Not on file  Social Connections:   . Frequency of Communication with Friends and Family: Not on file  . Frequency of Social Gatherings with Friends and Family: Not on file  . Attends Religious Services: Not on file  . Active Member of Clubs or Organizations: Not on file  . Attends Archivist Meetings: Not on file  . Marital Status: Not on file  Intimate Partner Violence:   . Fear of Current or  Ex-Partner: Not on file  . Emotionally Abused: Not on file  . Physically Abused: Not on file  . Sexually Abused: Not on file    Ms. Shingledecker family history includes Arrhythmia in her mother; Cancer in her sister; Colon cancer (age of onset: 23) in her sister; Hypertension in her father; Rectal cancer (age of onset: 65) in her sister.      Objective:    Vitals:   02/24/20 1122  BP: 128/88  Pulse: 77  SpO2: 96%    Physical Exam Well-developed well-nourished older white female in no acute distress.  Height, Weight, 88 BMI 14.7  HEENT; nontraumatic normocephalic, EOMI, PER R LA, sclera anicteric.  No hoarseness Oropharynx; not examined Neck; supple, no JVD Cardiovascular; regular rate and rhythm with S1-S2, no murmur rub or gallop Pulmonary; Clear bilaterally Abdomen; soft, nontender, nondistended, no palpable mass or hepatosplenomegaly, bowel sounds are active Rectal; not done Skin; benign exam, no jaundice rash or appreciable lesions Extremities; no clubbing cyanosis or edema skin warm and dry Neuro/Psych; alert and oriented x4, grossly nonfocal mood  and affect appropriate       Assessment & Plan:   #54 74 year old female with chronic globus sensation x18 months.  Symptoms had previously been associated with chronic cough and throat clearing which has for the most part resolved after starting a course of fluconazole. Prior diagnosis of LPR per ENT, patient could not tolerate 40 mg of omeprazole, and does not believe her symptoms are secondary to acid reflux so therefore discontinued the medication. Rule out globus, rule out LPR, doubt stricture or dysmotility but cannot rule out  #2 + family history of colon cancer-up-to-date with screening colonoscopy last done 2018 and normal  Plan; patient will be scheduled for upper endoscopy with Dr. Carlean Purl.  Procedure was discussed in detail with the patient including indications risks and benefits and she is agreeable to proceed.  We discussed potential esophageal dilation.  Also discussed option of barium swallow, and she wishes to proceed with EGD. Patient has not completed COVID-19 vaccination, will require testing 48 hours prior to procedure. Further recommendations pending findings at EGD.    Eleesha Purkey S Steffen Hase PA-C 02/24/2020   Cc: Everardo Beals, NP

## 2020-02-29 LAB — SARS CORONAVIRUS 2 (TAT 6-24 HRS): SARS Coronavirus 2: NEGATIVE

## 2020-03-02 ENCOUNTER — Encounter: Payer: Self-pay | Admitting: Internal Medicine

## 2020-03-02 ENCOUNTER — Ambulatory Visit (AMBULATORY_SURGERY_CENTER): Payer: Medicare HMO | Admitting: Internal Medicine

## 2020-03-02 ENCOUNTER — Other Ambulatory Visit: Payer: Self-pay

## 2020-03-02 VITALS — BP 118/74 | HR 63 | Temp 95.3°F | Resp 17 | Ht 65.0 in | Wt 88.0 lb

## 2020-03-02 DIAGNOSIS — K2270 Barrett's esophagus without dysplasia: Secondary | ICD-10-CM

## 2020-03-02 DIAGNOSIS — R198 Other specified symptoms and signs involving the digestive system and abdomen: Secondary | ICD-10-CM

## 2020-03-02 DIAGNOSIS — K2289 Other specified disease of esophagus: Secondary | ICD-10-CM

## 2020-03-02 DIAGNOSIS — R0989 Other specified symptoms and signs involving the circulatory and respiratory systems: Secondary | ICD-10-CM

## 2020-03-02 MED ORDER — SODIUM CHLORIDE 0.9 % IV SOLN: 500.0000 mL | Freq: Once | INTRAVENOUS | Status: DC

## 2020-03-02 NOTE — Op Note (Addendum)
[facility] Patient Name: Kristina InfanteKathleen Dennis Procedure Date: 03/02/2020 11:21 AM MRN: [id number] Endoscopist: Iva Booparl E Jessel Gettinger , MD Age: [age] Referring MD:  Date of Birth: [birth year] Gender: Female Account #: [id number] Procedure:                Upper GI endoscopy Indications:              Chronic cough, Globus sensation Medicines:                Propofol per Anesthesia, Monitored Anesthesia Care Procedure:                Pre-Anesthesia Assessment:                           - Prior to the procedure, a History and Physical                            was performed, and patient medications and                            allergies were reviewed. The patient's tolerance of                            previous anesthesia was also reviewed. The risks                            and benefits of the procedure and the sedation                            options and risks were discussed with the patient.                            All questions were answered, and informed consent                            was obtained. Prior Anticoagulants: The patient has                            taken no previous anticoagulant or antiplatelet                            agents. ASA Grade Assessment: II - A patient with                            mild systemic disease. After reviewing the risks                            and benefits, the patient was deemed in                            satisfactory condition to undergo the procedure.                           After obtaining informed consent, the endoscope was  passed under direct vision. Throughout the                            procedure, the patient's blood pressure, pulse, and                            oxygen saturations were monitored continuously. The                            Endoscope was introduced through the mouth, and                            advanced to the second part of duodenum. The upper                             GI endoscopy was accomplished without difficulty.                            The patient tolerated the procedure well. Scope In: Scope Out: Findings:                 The Z-line was irregular and was found at the                            gastroesophageal junction. Biopsies were taken with                            a cold forceps for histology. Verification of                            patient identification for the specimen was done.                            Estimated blood loss was minimal.                           The exam was otherwise without abnormality.                           The cardia and gastric fundus were normal on                            retroflexion. Complications:            No immediate complications. Estimated Blood Loss:     Estimated blood loss was minimal. Impression:               - Z-line irregular, at the gastroesophageal                            junction. Biopsied.                           - The examination was otherwise normal. Recommendation:           - Patient has a contact number available for  emergencies. The signs and symptoms of potential                            delayed complications were discussed with the                            patient. Return to normal activities tomorrow.                            Written discharge instructions were provided to the                            patient.                           - Resume previous diet.                           - Continue present medications.                           - Await pathology results.                           - I will call her with pathology results.                           She has a problem with globus sensation and cough                            that is essentially nocturnal only.                           History of fall in 2019 do not see anything on the                            C-spine CT that suggests major problems there could                             be related but could consider repeat C-spine                            imaging with x-ray.                           I did not see a Zenker's diverticulum though not                            always seen on EGD so barium swallow is another                            consideration. Also consider 24 hr impedance and pH                            - she has elevated  HOB to no avail.                           He has an intolerance to omeprazole with abdominal                            pain which can occur. Consider different PPI as                            another option should she want to. Iva Boop, MD 03/02/2020 11:58:05 AM This report has been signed electronically.

## 2020-03-02 NOTE — Progress Notes (Signed)
Called to room to assist during endoscopic procedure.  Patient ID and intended procedure confirmed with present staff. Received instructions for my participation in the procedure from the performing physician.  

## 2020-03-02 NOTE — Patient Instructions (Addendum)
Resume previous diet Continue current medications Await pathology resutls  I took some biopsies of the esophagus looking for acid reflux changes and will let you know - will call you and see what this means and what else we might do to help you. I appreciate the opportunity to care for you. Iva Boop, MD, FACG YOU HAD AN ENDOSCOPIC PROCEDURE TODAY AT THE [facility]:   Refer to the procedure report that was given to you for any specific questions about what was found during the examination.  If the procedure report does not answer your questions, please call your gastroenterologist to clarify.  If you requested that your care partner not be given the details of your procedure findings, then the procedure report has been included in a sealed envelope for you to review at your convenience later.  YOU SHOULD EXPECT: Some feelings of bloating in the abdomen. Passage of more gas than usual.  Walking can help get rid of the air that was put into your GI tract during the procedure and reduce the bloating. If you had a lower endoscopy (such as a colonoscopy or flexible sigmoidoscopy) you may notice spotting of blood in your stool or on the toilet paper. If you underwent a bowel prep for your procedure, you may not have a normal bowel movement for a few days.  Please Note:  You might notice some irritation and congestion in your nose or some drainage.  This is from the oxygen used during your procedure.  There is no need for concern and it should clear up in a day or so.  SYMPTOMS TO REPORT IMMEDIATELY:   Following upper endoscopy (EGD)  Vomiting of blood or coffee ground material  New chest pain or pain under the shoulder blades  Painful or persistently difficult swallowing  New shortness of breath  Fever of 100F or higher  Black, tarry-looking stools  For urgent or emergent issues, a gastroenterologist can be reached at any hour by calling [phone]. Do not use MyChart  messaging for urgent concerns.   DIET:  We do recommend a small meal at first, but then you may proceed to your regular diet.  Drink plenty of fluids but you should avoid alcoholic beverages for 24 hours.  ACTIVITY:  You should plan to take it easy for the rest of today and you should NOT DRIVE or use heavy machinery until tomorrow (because of the sedation medicines used during the test).    FOLLOW UP: Our staff will call the number listed on your records 48-72 hours following your procedure to check on you and address any questions or concerns that you may have regarding the information given to you following your procedure. If we do not reach you, we will leave a message.  We will attempt to reach you two times.  During this call, we will ask if you have developed any symptoms of COVID 19. If you develop any symptoms (ie: fever, flu-like symptoms, shortness of breath, cough etc.) before then, please call [phone].  If you test positive for Covid 19 in the 2 weeks post procedure, please call and report this information to [place].    If any biopsies were taken you will be contacted by phone or by letter within the next 1-3 weeks.  Please call us at [phone] if you have not heard about the biopsies in 3 weeks.   SIGNATURES/CONFIDENTIALITY: You and/or your care partner have signed paperwork which will be entered into your  electronic medical record.  These signatures attest to the fact that that the information above on your After Visit Summary has been reviewed and is understood.  Full responsibility of the confidentiality of this discharge information lies with you and/or your care-partner.

## 2020-03-02 NOTE — Progress Notes (Signed)
pt tolerated well. VSS. awake and to recovery. Report given to RN.  

## 2020-03-02 NOTE — Progress Notes (Signed)
Pt's states no medical or surgical changes since previsit or office visit.  CW - vitals 

## 2020-03-06 ENCOUNTER — Telehealth: Payer: Self-pay

## 2020-03-06 NOTE — Telephone Encounter (Signed)
  Follow up Call-  Call back number 03/02/2020  Post procedure Call Back phone  # [phone]  Some recent data might be hidden     1st follow up call made.  Female answered phone and when RN requested to speak to the patient phone was disconnected.  Will attempt follow up again in the afternoon.

## 2020-03-06 NOTE — Telephone Encounter (Signed)
°  Follow up Call-  Call back number 03/02/2020  Post procedure Call Back phone  # [phone]  Some recent data might be hidden     2nd follow up call made.  NAULM

## 2020-04-09 IMAGING — CR DG KNEE COMPLETE 4+V*L*
4 series · 4 of 4 positions shown · non-contrast
Comparison: None.

CLINICAL DATA: Knee pain after falling directly on it on concrete.

EXAM:
LEFT KNEE - COMPLETE 4+ VIEW

[knee ap]
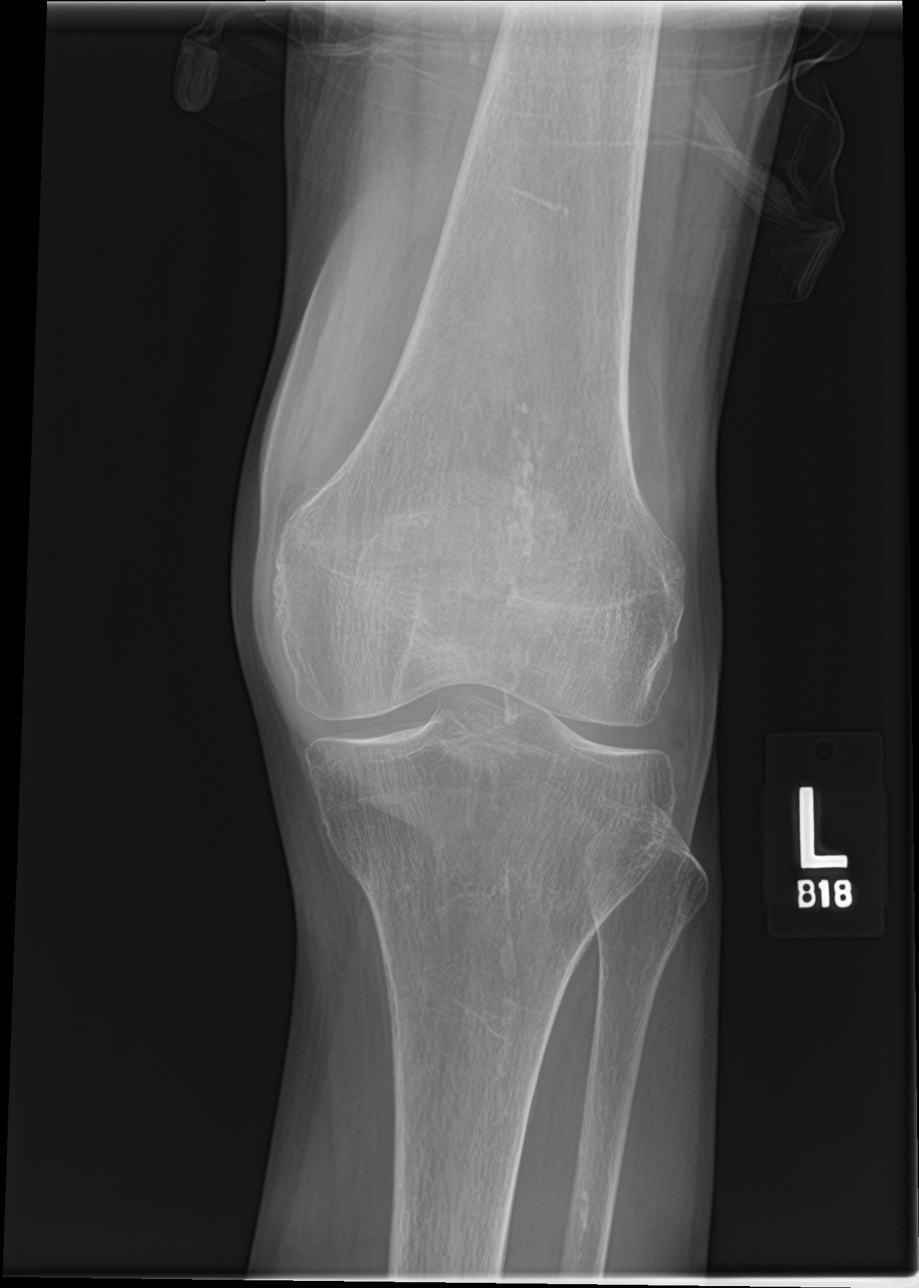

[knee lat]
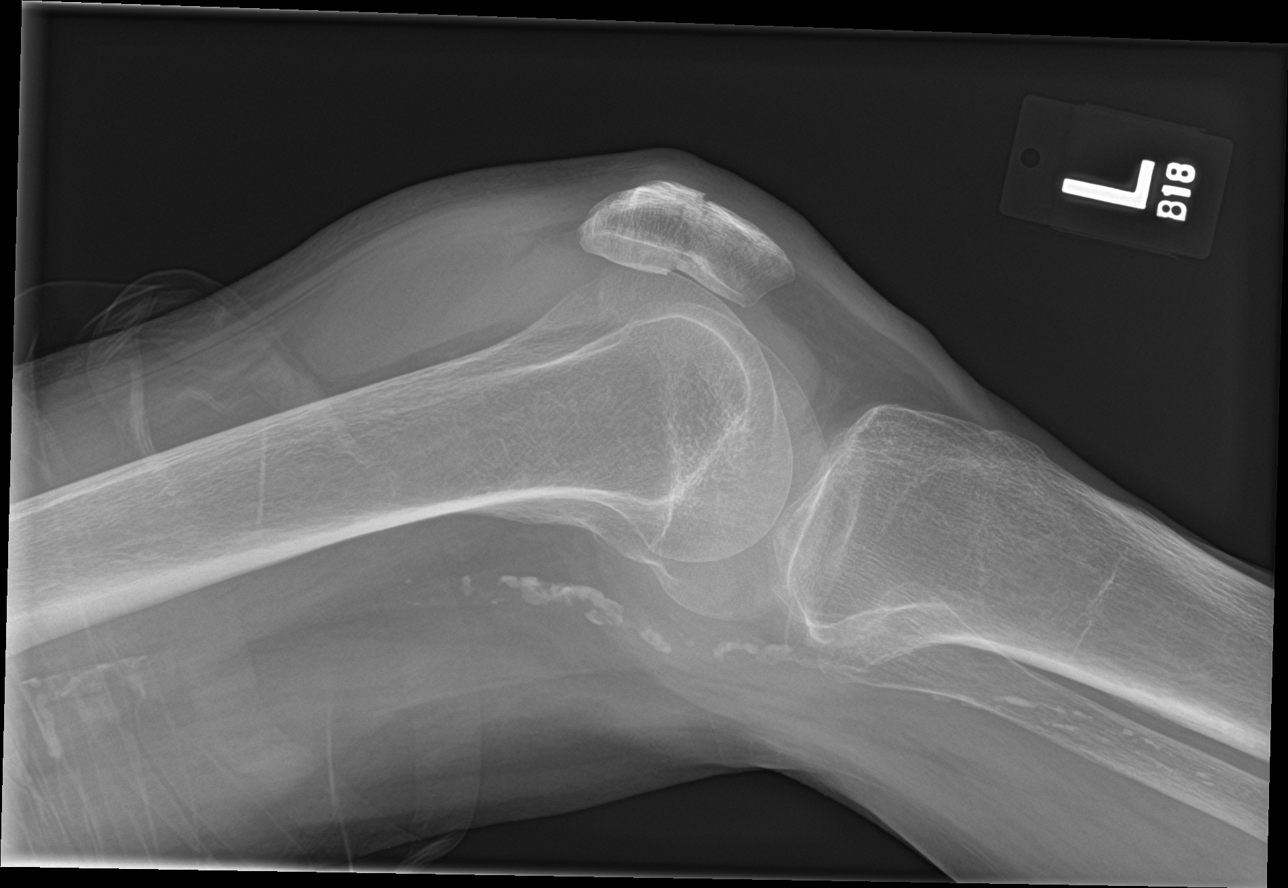

[knee obl (1 of 2)]
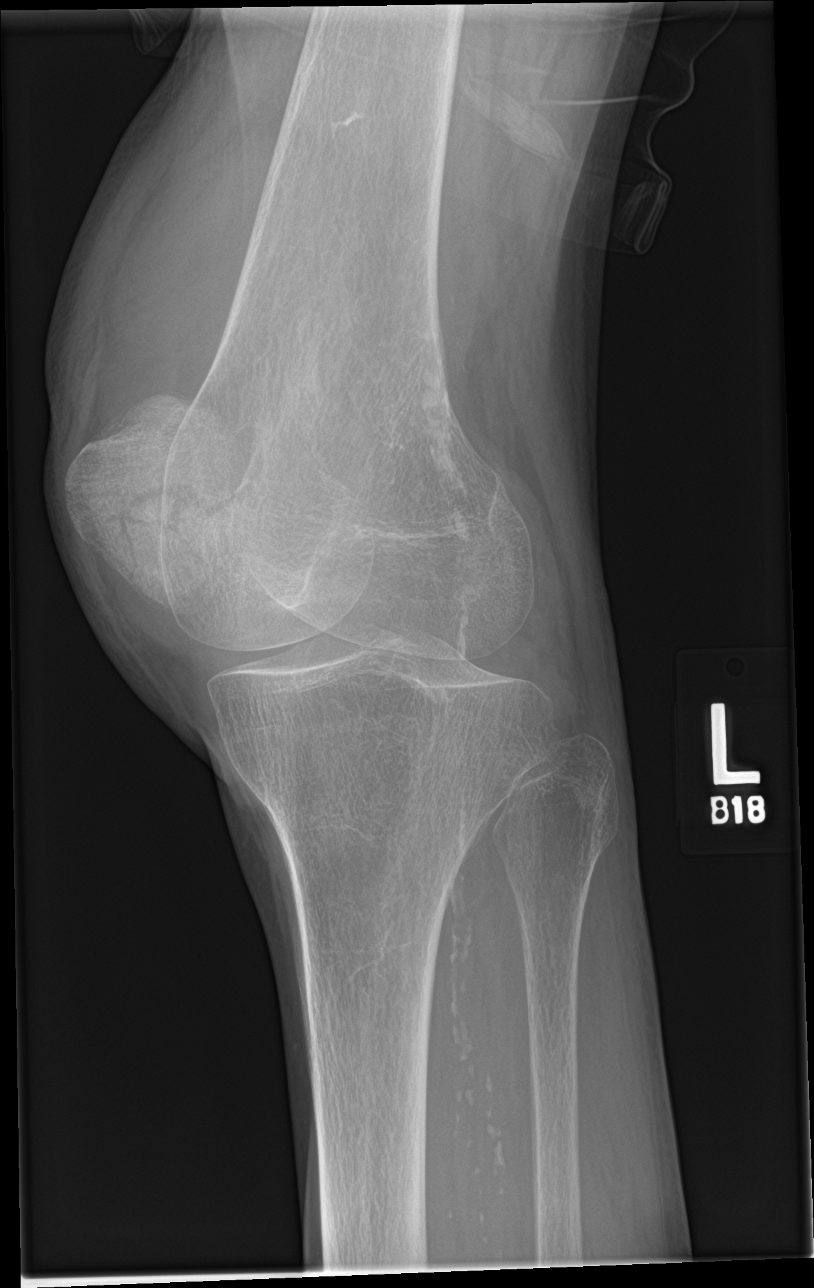

[knee obl (2 of 2)]
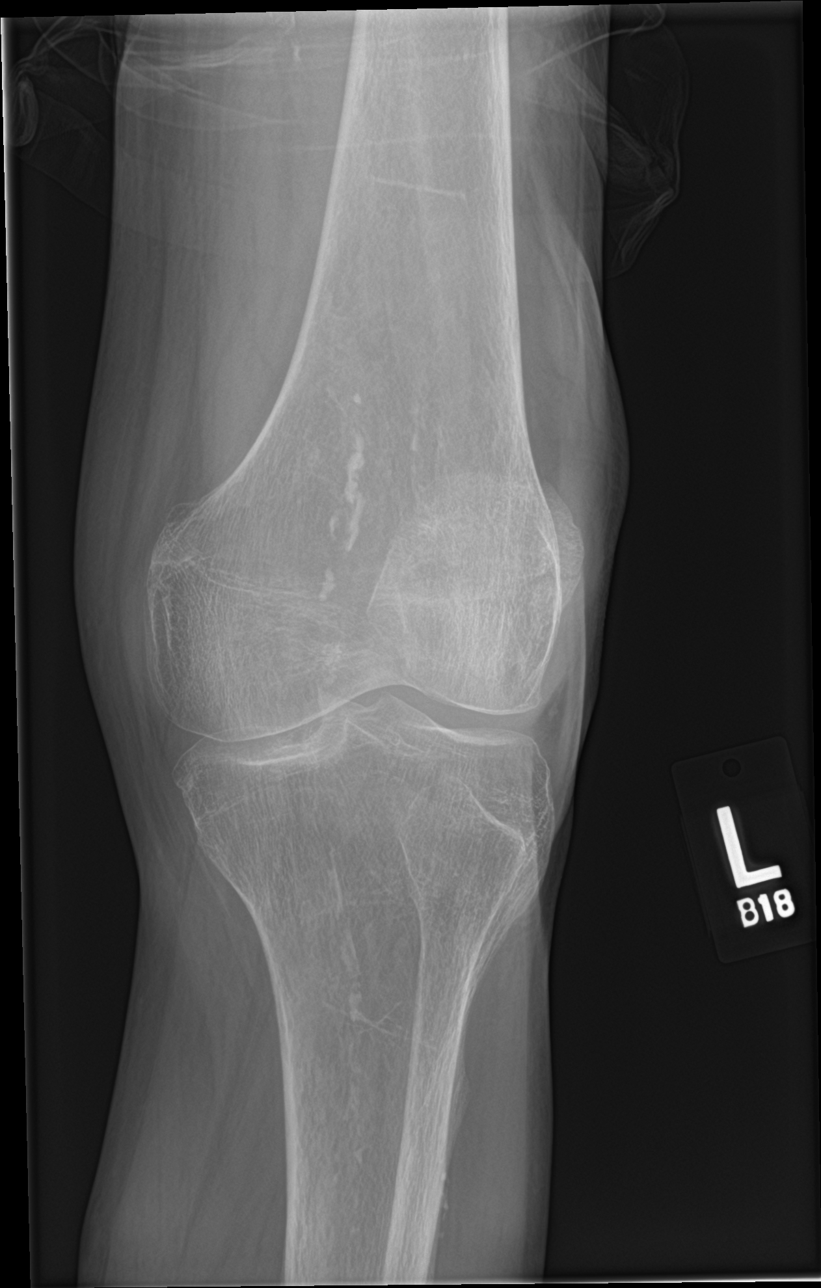

[4 of 4 positions shown; findings below may reference images not displayed]

FINDINGS: Acute, transverse, nondisplaced fracture through the mid patella.
Large joint effusion. Joint spaces are preserved. Osteopenia.
Vascular calcifications.
IMPRESSION: Nondisplaced fracture of the mid patella.  Large joint effusion.

## 2020-08-23 ENCOUNTER — Other Ambulatory Visit: Payer: Self-pay

## 2020-08-23 ENCOUNTER — Encounter: Payer: Self-pay | Admitting: Emergency Medicine

## 2020-08-23 ENCOUNTER — Ambulatory Visit: Payer: Medicare HMO | Admitting: Emergency Medicine

## 2020-08-23 DIAGNOSIS — J449 Chronic obstructive pulmonary disease, unspecified: Secondary | ICD-10-CM

## 2020-08-23 DIAGNOSIS — R053 Chronic cough: Secondary | ICD-10-CM | POA: Diagnosis not present

## 2020-08-23 NOTE — Progress Notes (Signed)
Subjective:    Patient ID: Kristina Dennis, female    DOB: [birth year], 75 y.o.   MRN: [id number]  HPI This new consultation for 75 year old former smoker (25 pack years) with GERD, allergic rhinitis, hypertension, headaches.  She is referred today for episodic chronic cough. She starting coughing 2 years ago, unsure what started it. Seemed to happen associated w meals. Also at random some times. Didn't have much nasal congestion or drainage. She has been empirically treated for allergic rhinitis and GERD, unsure whether these made any difference. Wearing her mask seems to bother it some. She denies any dyspnea, although she was a smoker, CXR looks like hyperinflation. She was on trelegy for 6 months, did not change her breathing or cough.    Review of Systems As per HPI  Past Medical History:  Diagnosis Date  . Arthritis   . Colitis 2003   per path report from 2003: non-specific.  normal colonoscopy in 04/2011.   Marland Kitchen GERD (gastroesophageal reflux disease)   . Hypertension   . Irritable bowel syndrome   . Osteoporosis   . Palpitations   . Pneumonia    hx  . Rhinitis      Family History  Problem Relation Age of Onset  . Hypertension Father   . Arrhythmia Mother   . Cancer Sister        rectal   . Rectal cancer Sister 28  . Colon cancer Sister 46  . Pancreatic cancer Neg Hx   . Esophageal cancer Neg Hx   . Stomach cancer Neg Hx      Social History   Socioeconomic History  . Marital status: Married    Spouse name: Not on file  . Number of children: 2  . Years of education: Not on file  . Highest education level: Not on file  Occupational History  . Occupation: Retired  Tobacco Use  . Smoking status: Former Smoker    Packs/day: 0.50    Years: 50.00    Pack years: 25.00    Types: Cigarettes    Quit date: 2020    Years since quitting: 2.3  . Smokeless tobacco: Never Used  Vaping Use  . Vaping Use: Never used  Substance and Sexual Activity  . Alcohol use: No     Alcohol/week: 0.0 standard drinks  . Drug use: No  . Sexual activity: Not on file  Other Topics Concern  . Not on file  Social History Narrative  . Not on file   Social Determinants of Health   Financial Resource Strain: Not on file  Food Insecurity: Not on file  Transportation Needs: Not on file  Physical Activity: Not on file  Stress: Not on file  Social Connections: Not on file  Intimate Partner Violence: Not on file    Has lived in [place], [place], [place], [place], [place], [place], [place] Retired X Comptroller,  Had a food business, has done Economist  Allergies  Allergen Reactions  . Hydrocodone Nausea And Vomiting  . Amlodipine     Severe all over joint pain  . Azithromycin Nausea Only  . Excedrin Extra Strength [Aspirin-Acetaminophen-Caffeine] Nausea Only    Extreme nausea  . Niacin Nausea And Vomiting  . Niacin And Related Nausea And Vomiting  . Sulfa Antibiotics Nausea And Vomiting and Other (See Comments)    Violently ill  . Sulfasalazine Nausea And Vomiting    Violently ill  . Latex Rash     Outpatient Medications Prior to Visit  Medication Sig Dispense Refill  . Ascorbic Acid (VITAMIN C) 1000 MG tablet Take 1,000 mg by mouth.    . Cholecalciferol (VITAMIN D-3 PO) Take 1 capsule by mouth 3 (three) times a week.    Marland Kitchen HYDROcodone-acetaminophen (NORCO/VICODIN) 5-325 MG tablet Take 1 tablet by mouth every 6 (six) hours as needed for moderate pain.    . Magnesium 200 MG TABS Take by mouth.    . polyethylene glycol powder (GLYCOLAX/MIRALAX) 17 GM/SCOOP powder Purelax 17 gram/dose oral powder  TALE 1 PACKET IN 8-12 OZ BEVERAGE EVERY 12 HOURS UNTIL BOWEL MOVEMENTS BEGIN THEN 1 PACKET    . rosuvastatin (CRESTOR) 20 MG tablet Take 20 mg by mouth daily.     Facility-Administered Medications Prior to Visit  Medication Dose Route Frequency Provider Last Rate Last Admin  . 0.9 %  sodium chloride infusion  500 mL Intravenous Once Iva Boop, MD            Objective:   Physical  Exam  Vitals:   08/23/20 1547  BP: 140/78  Pulse: 69  Temp: 98 F (36.7 C)  TempSrc: Temporal  SpO2: 98%  Weight: 88 lb 9.6 oz (40.2 kg)  Height: 5\' 6"  (1.676 m)   Gen: Pleasant, thin woman, in no distress,  normal affect  ENT: No lesions,  mouth clear,  oropharynx clear, no postnasal drip  Neck: No JVD, no stridor  Lungs: No use of accessory muscles, no crackles or wheezing on normal respiration, no wheeze on forced expiration  Cardiovascular: RRR, heart sounds normal, no murmur or gallops, no peripheral edema  Musculoskeletal: No deformities, no cyanosis or clubbing  Neuro: alert, awake, non focal  Skin: Warm, no lesions or rash     Assessment & Plan:  Chronic cough Sounded upper airway in nature based on history.  Interestingly she did not have much in the way of GERD or chronic rhinitis overall she minimizes the symptoms.  The cough was associated with meals, question microaspiration.  She was empirically treated for GERD and rhinitis.  Also empirically treated for COPD with Trelegy.  The powdered formulation of Trelegy may have ironically sustained her cough and cause it to last longer.  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  In absence of any symptoms and in absence of response to Trelegy I do not think we need to get PFT or pursue bronchodilator therapy at this time.  We could revisit if her breathing changes   , MD, PhD 08/23/2020, 4:34 PM [facility] [phone] or if no answer before 7:00PM call [phone] For any issues after 7:00PM please call eLink [phone]

## 2020-08-23 NOTE — Assessment & Plan Note (Signed)
Sounded upper airway in nature based on history.  Interestingly she did not have much in the way of GERD or chronic rhinitis overall she minimizes the symptoms.  The cough was associated with meals, question microaspiration.  She was empirically treated for GERD and rhinitis.  Also empirically treated for COPD with Trelegy.  The powdered formulation of Trelegy may have ironically sustained her cough and cause it to last longer.  I will follow-up with her if she has recurrence

## 2020-08-23 NOTE — Assessment & Plan Note (Signed)
Probably stage A COPD based on emphysematous change on her chest x-ray.  In absence of any symptoms and in absence of response to Trelegy I do not think we need to get PFT or pursue bronchodilator therapy at this time.  We could revisit if her breathing changes

## 2020-08-23 NOTE — Patient Instructions (Signed)
I am glad you cough and throat irritation are improved.  Please call our office to arrange follow-up if you begin to have recurrent cough, more problems.

## 2021-01-25 ENCOUNTER — Emergency Department (HOSPITAL_COMMUNITY)
Admission: EM | Admit: 2021-01-25 | Discharge: 2021-01-25 | Disposition: A | Discharge status: Home / Self Care | Payer: Medicare HMO | Attending: Emergency Medicine | Admitting: Emergency Medicine

## 2021-01-25 ENCOUNTER — Encounter (HOSPITAL_COMMUNITY): Payer: Self-pay | Admitting: *Deleted

## 2021-01-25 ENCOUNTER — Other Ambulatory Visit: Payer: Self-pay

## 2021-01-25 ENCOUNTER — Emergency Department (HOSPITAL_COMMUNITY): Payer: Medicare HMO

## 2021-01-25 DIAGNOSIS — R002 Palpitations: Secondary | ICD-10-CM

## 2021-01-25 DIAGNOSIS — R778 Other specified abnormalities of plasma proteins: Secondary | ICD-10-CM | POA: Insufficient documentation

## 2021-01-25 DIAGNOSIS — I4891 Unspecified atrial fibrillation: Secondary | ICD-10-CM | POA: Insufficient documentation

## 2021-01-25 DIAGNOSIS — Z7901 Long term (current) use of anticoagulants: Secondary | ICD-10-CM | POA: Insufficient documentation

## 2021-01-25 DIAGNOSIS — Z87891 Personal history of nicotine dependence: Secondary | ICD-10-CM | POA: Diagnosis not present

## 2021-01-25 DIAGNOSIS — Z91040 Latex allergy status: Secondary | ICD-10-CM | POA: Insufficient documentation

## 2021-01-25 DIAGNOSIS — J449 Chronic obstructive pulmonary disease, unspecified: Secondary | ICD-10-CM | POA: Insufficient documentation

## 2021-01-25 LAB — CBC WITH DIFFERENTIAL/PLATELET
Abs Immature Granulocytes: 0.03 10*3/uL (ref 0.00–0.07)
Basophils Absolute: 0.1 10*3/uL (ref 0.0–0.1)
Basophils Relative: 1 %
Eosinophils Absolute: 0.1 10*3/uL (ref 0.0–0.5)
Eosinophils Relative: 1 %
HCT: 39.8 % (ref 36.0–46.0)
Hemoglobin: 12.7 g/dL (ref 12.0–15.0)
Immature Granulocytes: 0 %
Lymphocytes Relative: 25 %
Lymphs Abs: 2.5 10*3/uL (ref 0.7–4.0)
MCH: 29.6 pg (ref 26.0–34.0)
MCHC: 31.9 g/dL (ref 30.0–36.0)
MCV: 92.8 fL (ref 80.0–100.0)
Monocytes Absolute: 0.8 10*3/uL (ref 0.1–1.0)
Monocytes Relative: 8 %
Neutro Abs: 6.5 10*3/uL (ref 1.7–7.7)
Neutrophils Relative %: 65 %
Platelets: 265 10*3/uL (ref 150–400)
RBC: 4.29 MIL/uL (ref 3.87–5.11)
RDW: 13.5 % (ref 11.5–15.5)
WBC: 10 10*3/uL (ref 4.0–10.5)
nRBC: 0 % (ref 0.0–0.2)

## 2021-01-25 LAB — BASIC METABOLIC PANEL
Anion gap: 9 (ref 5–15)
BUN: 19 mg/dL (ref 8–23)
CO2: 26 mmol/L (ref 22–32)
Calcium: 10.1 mg/dL (ref 8.9–10.3)
Chloride: 98 mmol/L (ref 98–111)
Creatinine, Ser: 0.49 mg/dL (ref 0.44–1.00)
GFR, Estimated: >60 mL/min (ref >60)
Glucose, Bld: 82 mg/dL (ref 70–99)
Potassium: 3.6 mmol/L (ref 3.5–5.1)
Sodium: 133 mmol/L — ABNORMAL LOW (ref 135–145)

## 2021-01-25 LAB — TROPONIN I (HIGH SENSITIVITY)
Troponin I (High Sensitivity): 70 ng/L — ABNORMAL HIGH (ref <18)
Troponin I (High Sensitivity): 137 ng/L (ref <18)

## 2021-01-25 MED ORDER — APIXABAN 5 MG PO TABS
5.0000 mg | ORAL_TABLET | Freq: Once | ORAL | Status: AC
  Administered 2021-01-25: 5 mg via ORAL
  Filled 2021-01-25: qty 1

## 2021-01-25 MED ORDER — APIXABAN (ELIQUIS) VTE STARTER PACK (10MG AND 5MG): ORAL_TABLET | ORAL | 0 refills | Status: DC

## 2021-01-25 MED ORDER — APIXABAN 5 MG PO TABS: 5.0000 mg | ORAL_TABLET | Freq: Two times a day (BID) | ORAL | 0 refills | Status: DC

## 2021-01-25 MED ORDER — APIXABAN 5 MG PO TABS: 10.0000 mg | ORAL_TABLET | Freq: Once | ORAL | Status: DC

## 2021-01-25 NOTE — ED Provider Notes (Signed)
Emergency Medicine Provider Triage Evaluation Note  Kristina Dennis , a 75 y.o. female  was evaluated in triage.  Pt complains of chest palpitations.  Making bread earlier today around 1230.  Felt her heart was "flip flopping."  Symptoms lasted about an hour or two and self resolved.  No chest pain, shortness of breath.  No history of arrhythmia, A. fib.  Review of Systems  Positive: Palpitations Negative: CP, SOB, back pain  Physical Exam  BP 111/89 (BP Location: Right Arm)   Pulse 66   Temp 98.1 F (36.7 C) (Oral)   Resp 16   Ht 5\' 6"  (1.676 m)   Wt 39 kg   LMP 05/03/1987   SpO2 100%   BMI 13.88 kg/m  Gen:   Awake, no distress   Resp:  Normal effort  MSK:   Moves extremities without difficulty  Other:    Medical Decision Making  Medically screening exam initiated at 3:48 PM.  Appropriate orders placed.  NYAZIA CANEVARI was informed that the remainder of the evaluation will be completed by another provider, this initial triage assessment does not replace that evaluation, and the importance of remaining in the ED until their evaluation is complete.  palpitations   Fritz Cauthon A, PA-C 01/25/21 1600    Long, 03/27/21, MD 01/29/21 [phone]

## 2021-01-25 NOTE — ED Triage Notes (Signed)
Pt  states "a lot of heart activity" and L sided arm pain that started at 12:30 when she was taking bread out of the oven.  Symptoms lasted 2 hours.  Now she is symptom free.

## 2021-01-25 NOTE — ED Provider Notes (Signed)
[facility] EMERGENCY DEPARTMENT Provider Note   CSN: [id number] Arrival date & time: 01/25/21  1400     History Chief Complaint  Patient presents with   Chest Pain    Kristina Dennis is a 75 y.o. female.  HPI This is a 75 year old female with history of hypertension and COPD who presents with palpitations.  Patient reports sudden onset of palpitations around 1 PM today.  Felt like her heart was racing and skipping beats.  Was associated with left arm pain, diaphoresis, and nausea.  Symptoms stopped around 3 PM when patient was in the emergency department.  She denies ever having chest pain or shortness of breath.  She denies any recent sick symptoms including fever, chills, cough, congestion, vomiting, diarrhea.    Past Medical History:  Diagnosis Date   Arthritis    Colitis 2003   per path report from 2003: non-specific.  normal colonoscopy in 04/2011.    GERD (gastroesophageal reflux disease)    Hypertension    Irritable bowel syndrome    Osteoporosis    Palpitations    Pneumonia    hx   Rhinitis     Patient Active Problem List   Diagnosis Date Noted   Chronic cough 08/23/2020   COPD (chronic obstructive pulmonary disease) (HCC) 08/23/2020   Contusion of wrist 11/04/2018   Pain in wrist 11/04/2018   Open fracture of nasal bones 10/22/2017   Acute diffuse otitis externa of right ear 05/06/2017   Eustachian tube dysfunction, bilateral 05/06/2017   Excessive drinking of alcohol 06/18/2014   Partial small bowel obstruction (HCC) 06/16/2014   Diarrhea 06/16/2014   Urethral spasm 06/16/2014   Palpitations 04/02/2011    Past Surgical History:  Procedure Laterality Date   BUNIONECTOMY Left    COLONOSCOPY     DILATION AND CURETTAGE OF UTERUS     KNEE SURGERY Right    arthroscopy   ORIF WRIST FRACTURE Left 12/13/2013   Procedure: OPEN REDUCTION INTERNAL FIXATION (ORIF) WRIST FRACTURE;  Surgeon: Cammy Copa, MD;  Location: [facility] OR;  Service:  Orthopedics;  Laterality: Left;   TONSILLECTOMY     TUBAL LIGATION       OB History   No obstetric history on file.     Family History  Problem Relation Age of Onset   Hypertension Father    Arrhythmia Mother    Cancer Sister        rectal    Rectal cancer Sister 51   Colon cancer Sister 41   Pancreatic cancer Neg Hx    Esophageal cancer Neg Hx    Stomach cancer Neg Hx     Social History   Tobacco Use   Smoking status: Former    Packs/day: 0.50    Years: 50.00    Pack years: 25.00    Types: Cigarettes    Quit date: 2020    Years since quitting: 2.7   Smokeless tobacco: Never  Vaping Use   Vaping Use: Never used  Substance Use Topics   Alcohol use: No    Alcohol/week: 0.0 standard drinks   Drug use: No    Home Medications Prior to Admission medications   Medication Sig Start Date End Date Taking? Authorizing Provider  apixaban (ELIQUIS) 5 MG TABS tablet Take 1 tablet (5 mg total) by mouth 2 (two) times daily. 01/25/21 02/24/21 Yes Doran Clay, MD  Ascorbic Acid (VITAMIN C) 1000 MG tablet Take 1,000 mg by mouth.    [provider]  Cholecalciferol (VITAMIN D-3 PO) Take 1 capsule by mouth 3 (three) times a week.    [provider]  HYDROcodone-acetaminophen (NORCO/VICODIN) 5-325 MG tablet Take 1 tablet by mouth every 6 (six) hours as needed for moderate pain.    [provider]  Magnesium 200 MG TABS Take by mouth.    [provider]  polyethylene glycol powder (GLYCOLAX/MIRALAX) 17 GM/SCOOP powder Purelax 17 gram/dose oral powder  TALE 1 PACKET IN 8-12 OZ BEVERAGE EVERY 12 HOURS UNTIL BOWEL MOVEMENTS BEGIN THEN 1 PACKET    [provider]  rosuvastatin (CRESTOR) 20 MG tablet Take 20 mg by mouth daily. 08/29/18   [provider]    Allergies    Hydrocodone, Amlodipine, Azithromycin, Excedrin extra strength [aspirin-acetaminophen-caffeine], Niacin, Niacin and related, Sulfa antibiotics, Sulfasalazine, and  Latex  Review of Systems   Review of Systems  Constitutional:  Negative for chills and fever.  HENT:  Negative for ear pain and sore throat.   Eyes:  Negative for pain and visual disturbance.  Respiratory:  Negative for cough and shortness of breath.   Cardiovascular:  Positive for palpitations. Negative for chest pain.  Gastrointestinal:  Positive for nausea. Negative for abdominal pain and vomiting.  Genitourinary:  Negative for dysuria and hematuria.  Musculoskeletal:  Negative for arthralgias and back pain.  Skin:  Negative for color change and rash.  Neurological:  Negative for seizures and syncope.  All other systems reviewed and are negative.  Physical Exam Updated Vital Signs BP 112/75 (BP Location: Right Arm)   Pulse 60   Temp 98.1 F (36.7 C) (Oral)   Resp 16   Ht 5\' 6"  (1.676 m)   Wt 39 kg   LMP 05/03/1987   SpO2 97%   BMI 13.88 kg/m   Physical Exam Vitals and nursing note reviewed.  Constitutional:      General: She is not in acute distress.    Appearance: She is well-developed.  HENT:     Head: Normocephalic and atraumatic.  Eyes:     Conjunctiva/sclera: Conjunctivae normal.  Cardiovascular:     Rate and Rhythm: Normal rate and regular rhythm.     Heart sounds: No murmur heard. Pulmonary:     Effort: Pulmonary effort is normal. No respiratory distress.     Breath sounds: Normal breath sounds.  Abdominal:     Palpations: Abdomen is soft.     Tenderness: There is no abdominal tenderness.  Musculoskeletal:     Cervical back: Neck supple.     Right lower leg: No tenderness. No edema.     Left lower leg: No tenderness. No edema.  Skin:    General: Skin is warm and dry.  Neurological:     Mental Status: She is alert.    ED Results / Procedures / Treatments   Labs (all labs ordered are listed, but only abnormal results are displayed) Labs Reviewed  BASIC METABOLIC PANEL - Abnormal; Notable for the following components:      Result Value   Sodium  133 (*)    All other components within normal limits  TROPONIN I (HIGH SENSITIVITY) - Abnormal; Notable for the following components:   Troponin I (High Sensitivity) 70 (*)    All other components within normal limits  TROPONIN I (HIGH SENSITIVITY) - Abnormal; Notable for the following components:   Troponin I (High Sensitivity) 137 (*)    All other components within normal limits  CBC WITH DIFFERENTIAL/PLATELET    EKG EKG Interpretation  Date/Time:  Thursday January 25 2021 14:14:50 EDT Ventricular Rate:  119 PR Interval:    QRS Duration: 94 QT Interval:  350 QTC Calculation: 492 R Axis:   54 Text Interpretation: Atrial fibrillation with rapid ventricular response Nonspecific ST abnormality Abnormal ECG Otherwise no significant change Confirmed by Melene Plan [phone]) on 01/25/2021 6:34:01 PM  Radiology DG Chest 2 View  Result Date: 01/25/2021 CLINICAL DATA:  Chest pain. EXAM: CHEST - 2 VIEW COMPARISON:  Chest x-ray 03/12/2011. FINDINGS: The lungs are hyperinflated, unchanged. There is no focal lung infiltrate, pleural effusion or pneumothorax. There is some minimal strandy opacities in the right upper lobe new from prior examination. There is stable scarring in the lung apices. Cardiomediastinal silhouette is within normal limits. No acute fractures are seen. IMPRESSION: 1. No evidence for pneumonia or edema. 2. Minimal strandy opacities in the right upper lobe may be infectious/inflammatory. Recommend follow-up PA and Lat chest x-ray in 4-6 weeks to confirm resolution. 3.  Emphysema (ICD10-J43.9). Electronically Signed   By: Darliss Cheney M.D.   On: 01/25/2021 16:47    Procedures Procedures   Medications Ordered in ED Medications  apixaban (ELIQUIS) tablet 5 mg (has no administration in time range)    ED Course  I have reviewed the triage vital signs and the nursing notes.  Pertinent labs & imaging results that were available during my care of the patient were reviewed by me  and considered in my medical decision making (see chart for details).    MDM Rules/Calculators/A&P                          On initial evaluation patient is hemodynamically stable and well-appearing.  EKG on arrival consistent with A. fib with RVR with rate of 119.  This is likely the cause of patient's palpitations and other symptoms.  On my evaluation repeat EKG with sinus rhythm with rate of 59.  There is nonspecific ST abnormality in the precordial leads, but no other evidence of acute ischemia.  Initial troponin elevated at 70 and trends upward to 140, likely in the setting of acute A. fib with RVR.  Patient has no ongoing chest pain, palpitations, shortness of breath, diaphoresis, or nausea.  Very low suspicion for acute ACS.  No clear precipitating factors for patients new onset afib. No recent infectious symptoms. CXR comments on possible R upper lobe opacity, but patient clinically has no cough or fever and clear lungs on exam. Likely 2/2 COPD hx.   CHA2DS2-VASc score of 4 as below. Patient denies any history of frequent falls or GI bleeding. Will initiate eliquis s/p risk benefit discussion with patient and husband. Consulted cardiology who agrees with initiating eliquis and outpatient f/u. Agree that troponin elevation likely 2/2 demand in setting of rapid rate.   Patient discharged in stable condition.  Given strict return precautions.  Will follow up with PCP and her cardiologist.  Also given the number of [facility].  Patient given education on initiation of anticoagulation.  She expressed understanding.  This patients CHA2DS2-VASc Score and unadjusted Ischemic Stroke Rate (% per year) is equal to 4.8 % stroke rate/year from a score of 4  Above score calculated as 1 point each if present [CHF, HTN, DM, Vascular=MI/PAD/Aortic Plaque, Age if 65-74, or Female] Above score calculated as 2 points each if present [Age > 75, or Stroke/TIA/TE]    Final Clinical Impression(s) / ED  Diagnoses Final diagnoses:  Atrial fibrillation with  rapid ventricular response (HCC)  Palpitations    Rx / DC Orders ED Discharge Orders          Ordered    APIXABAN (ELIQUIS) VTE STARTER PACK (10MG  AND 5MG )  Status:  Discontinued        01/25/21 2020    apixaban (ELIQUIS) 5 MG TABS tablet  2 times daily        01/25/21 2105             03/27/21, MD 01/25/21 2110    03/27/21, DO 01/25/21 2131

## 2021-01-25 NOTE — ED Notes (Signed)
This RN called lab to verify that repeat troponin had been received. Kim with lab confirms it has been received.

## 2021-01-25 NOTE — ED Notes (Signed)
Pt not in room.

## 2021-01-25 NOTE — Discharge Instructions (Addendum)
On arrival to the emergency department today your heart was in a rhythm called atrial fibrillation with an elevated rate.  On reassessment your heart is now in normal sinus rhythm with a normal rate.  Based on your risk of stroke we have advised that you start on a blood thinner called Eliquis. Take one tablet twice daily. You should follow-up with your primary doctor and a cardiologist in the next week. The number for the [facility] Cardiology office is listed above. Please call them to schedule or schedule with your preferred cardiologist. Return to the Ed with new or worsening symptoms including chest pain or shortness of breath.

## 2021-01-26 ENCOUNTER — Telehealth: Payer: Self-pay | Admitting: Nurse Practitioner

## 2021-01-26 NOTE — Telephone Encounter (Signed)
Received call from patient's husband, Kristina Dennis, who states his wife was diagnosed with a fib in the ED last night and when they called for an appointment, the patient was told patient could not see Dr. Elease Hashimoto before January. I reviewed patient's record and advised husband of importance of patient taking Eliquis which husband verbalized agreement. I scheduled patient to see Dr. Elease Hashimoto on 10/11 and the husband expressed gratitude.

## 2021-01-30 ENCOUNTER — Encounter: Payer: Self-pay | Admitting: *Deleted

## 2021-01-30 ENCOUNTER — Encounter: Payer: Self-pay | Admitting: Cardiovascular Disease

## 2021-01-30 ENCOUNTER — Other Ambulatory Visit: Payer: Self-pay

## 2021-01-30 ENCOUNTER — Ambulatory Visit: Payer: Medicare HMO | Admitting: Cardiovascular Disease

## 2021-01-30 VITALS — BP 116/60 | HR 74 | Ht 66.0 in | Wt 90.2 lb

## 2021-01-30 DIAGNOSIS — I4891 Unspecified atrial fibrillation: Secondary | ICD-10-CM | POA: Insufficient documentation

## 2021-01-30 DIAGNOSIS — I480 Paroxysmal atrial fibrillation: Secondary | ICD-10-CM

## 2021-01-30 DIAGNOSIS — M79602 Pain in left arm: Secondary | ICD-10-CM

## 2021-01-30 DIAGNOSIS — Z7901 Long term (current) use of anticoagulants: Secondary | ICD-10-CM | POA: Diagnosis not present

## 2021-01-30 DIAGNOSIS — R079 Chest pain, unspecified: Secondary | ICD-10-CM | POA: Diagnosis not present

## 2021-01-30 NOTE — Patient Instructions (Signed)
Medication Instructions:  Your physician recommends that you continue on your current medications as directed. Please refer to the Current Medication list given to you today.  *If you need a refill on your cardiac medications before your next appointment, please call your pharmacy*   Lab Work: BMET and CBC in 1 month  If you have labs (blood work) drawn today and your tests are completely normal, you will receive your results only by: MyChart Message (if you have MyChart) OR A paper copy in the mail If you have any lab test that is abnormal or we need to change your treatment, we will call you to review the results.   Testing/Procedures: Your physician has requested that you have a lexiscan myoview. For further information please visit [email or web address] Please follow instruction sheet, as given.   Follow-Up: At [facility], you and your health needs are our priority.  As part of our continuing mission to provide you with exceptional heart care, we have created designated Provider Care Teams.  These Care Teams include your primary Cardiologist (physician) and Advanced Practice Providers (APPs -  Physician Assistants and Nurse Practitioners) who all work together to provide you with the care you need, when you need it.  We recommend signing up for the patient portal called "MyChart".  Sign up information is provided on this After Visit Summary.  MyChart is used to connect with patients for Virtual Visits (Telemedicine).  Patients are able to view lab/test results, encounter notes, upcoming appointments, etc.  Non-urgent messages can be sent to your provider as well.   To learn more about what you can do with MyChart, go to [email or web address].    Your next appointment:   6 month(s)  The format for your next appointment:   In Person  Provider:   You will see one of the following Advanced Practice Providers on your designated Care Team:   Tereso Newcomer, PA-C Chelsea Aus,  [place]   Other Instructions

## 2021-01-30 NOTE — Progress Notes (Signed)
2

## 2021-01-30 NOTE — Progress Notes (Signed)
Cardiology Office Note:    Date:  01/30/2021   ID:  Kristina Dennis, DOB 03/04/1946, MRN [id number]  PCP:  Everardo Beals, NP  Cardiologist:  Mertie Moores, MD  Electrophysiologist:  None   Referring MD: Everardo Beals, NP   Chief Complaint  Patient presents with   Atrial Fibrillation      Feb. 10, 2020    Kristina Dennis is a 75 y.o. female with a hx of  HTN.   I met Kristina Dennis in 2012 and have not seen her since then.  She was having issues with palpitations We were asked by Dr. Forde Dandy to see her coronary artery calcifications that were noted incidentally on a chest CT from 19-Oct-2017.  No hx of CAD ( father died of a brain aneurism, mother died of a broken neck )   Other members died of accidents and cancer.  She denies having any episodes of chest pain.  She does exercise on a regular basis  ( physical therapy and light weights )    Does not do cardio exercise .   Has foot issues/   Takes amlodipine on a PRN basis  - ends up taking it every week or 2 .   January 30, 2021: Kristina Dennis is seen today for follow-up of her hypertension.  She apparently was found to have atrial fibrillation.  On October 6 she developed palpitations.  She presented to the emergency room and was found to have atrial fibrillation with rapid ventricular response.  Episode resolved on its own. Troponin level was 70 and then increased to 137.  Episode lasted for 1-2 hours.  No CP , no dyspnea Associated with some left arm ache. No chest ache.  She had some palpitations in 2020, Myoivew was normal at time  No cardiac Does Tai chi   Past Medical History:  Diagnosis Date   Arthritis    Colitis 2003   per path report from 2003: non-specific.  normal colonoscopy in 04/2011.    GERD (gastroesophageal reflux disease)    Hypertension    Irritable bowel syndrome    Osteoporosis    Palpitations    Pneumonia    hx   Rhinitis     Past Surgical History:  Procedure Laterality Date    BUNIONECTOMY Left    COLONOSCOPY     DILATION AND CURETTAGE OF UTERUS     KNEE SURGERY Right    arthroscopy   ORIF WRIST FRACTURE Left 12/13/2013   Procedure: OPEN REDUCTION INTERNAL FIXATION (ORIF) WRIST FRACTURE;  Surgeon: Meredith Pel, MD;  Location: [facility];  Service: Orthopedics;  Laterality: Left;   TONSILLECTOMY     TUBAL LIGATION      Current Medications: Current Meds  Medication Sig   apixaban (ELIQUIS) 5 MG TABS tablet Take 1 tablet (5 mg total) by mouth 2 (two) times daily.   Ascorbic Acid (VITAMIN C) 1000 MG tablet Take 1,000 mg by mouth.   Cholecalciferol (VITAMIN D-3 PO) Take 1 capsule by mouth 3 (three) times a week.   Magnesium 200 MG TABS Take by mouth.   polyethylene glycol powder (GLYCOLAX/MIRALAX) 17 GM/SCOOP powder Purelax 17 gram/dose oral powder  TALE 1 PACKET IN 8-12 OZ BEVERAGE EVERY 12 HOURS UNTIL BOWEL MOVEMENTS BEGIN THEN 1 PACKET   rosuvastatin (CRESTOR) 20 MG tablet Take 20 mg by mouth daily.   Current Facility-Administered Medications for the 01/30/21 encounter (Office Visit) with Mabrey Howland, Wonda Cheng, MD  Medication   0.9 %  sodium chloride  infusion     Allergies:   Hydrocodone, Amlodipine, Azithromycin, Excedrin extra strength [aspirin-acetaminophen-caffeine], Niacin, Niacin and related, Sulfa antibiotics, Sulfasalazine, and Latex   Social History   Socioeconomic History   Marital status: Married    Spouse name: Not on file   Number of children: 2   Years of education: Not on file   Highest education level: Not on file  Occupational History   Occupation: Retired  Tobacco Use   Smoking status: Former    Packs/day: 0.50    Years: 50.00    Pack years: 25.00    Types: Cigarettes    Quit date: 2020    Years since quitting: 2.7   Smokeless tobacco: Never  Vaping Use   Vaping Use: Never used  Substance and Sexual Activity   Alcohol use: No    Alcohol/week: 0.0 standard drinks   Drug use: No   Sexual activity: Not on file  Other Topics  Concern   Not on file  Social History Narrative   Not on file   Social Determinants of Health   Financial Resource Strain: Not on file  Food Insecurity: Not on file  Transportation Needs: Not on file  Physical Activity: Not on file  Stress: Not on file  Social Connections: Not on file     Family History: The patient's family history includes Arrhythmia in her mother; Cancer in her sister; Colon cancer (age of onset: 64) in her sister; Hypertension in her father; Rectal cancer (age of onset: 66) in her sister. There is no history of Pancreatic cancer, Esophageal cancer, or Stomach cancer.  ROS:   Please see the history of present illness.     All other systems reviewed and are negative.  EKGs/Labs/Other Studies Reviewed:    The following studies were reviewed today:   EKG:   January 30, 2021: Normal sinus rhythm at 74.  Q waves in leads II, 3, aVF which are unchanged from previous exam.  Nonspecific ST and T wave changes.  Recent Labs: 01/25/2021: BUN 19; Creatinine, Ser 0.49; Hemoglobin 12.7; Platelets 265; Potassium 3.6; Sodium 133  Recent Lipid Panel No results found for: CHOL, TRIG, HDL, CHOLHDL, VLDL, LDLCALC, LDLDIRECT  Physical Exam:     Physical Exam: Blood pressure 116/60, pulse 74, height '5\' 6"'  (1.676 m), weight 90 lb 3.2 oz (40.9 kg), last menstrual period 05/03/1987, SpO2 97 %.  GEN:  Well nourished, well developed in no acute distress HEENT: Normal NECK: No JVD; No carotid bruits LYMPHATICS: No lymphadenopathy CARDIAC: RRR , no murmurs, rubs, gallops RESPIRATORY:  Clear to auscultation without rales, wheezing or rhonchi  ABDOMEN: Soft, non-tender, non-distended MUSCULOSKELETAL:  No edema; No deformity  SKIN: Warm and dry NEUROLOGIC:  Alert and oriented x 3    ASSESSMENT:    1. Chest pain, unspecified type   2. Anticoagulant long-term use   3. Paroxysmal atrial fibrillation (HCC)   4. Left arm pain     PLAN:       1.  Left arm pain: She has a  history of coronary artery calcifications:  Marland Kitchen  Myoview study was negative several years ago.  Order to repeat a Myoview study to make sure that she does not have any coronary artery disease.  She had mildly elevated troponin levels when she was admitted with rapid atrial fibrillation last week.   Paroxysmal atrial fibrillation: She presented to the emergency room with palpitations and was found to have paroxysmal atrial fibrillation.  She converted to sinus rhythm while  in the ER.  She was started on Eliquis 5 mg a day.  Based on her age and creatinine she does qualify for the 5 mg twice a day tablet.  She has already converted to sinus rhythm and is has maintained sinus rhythm today.  We will see her in 1 month for blood work including a CBC and basic metabolic profile.  We will have her see an APP in 6 months.         Medication Adjustments/Labs and Tests Ordered: Current medicines are reviewed at length with the patient today.  Concerns regarding medicines are outlined above.  Orders Placed This Encounter  Procedures   Basic metabolic panel   CBC   MYOCARDIAL PERFUSION IMAGING   EKG 12-Lead    No orders of the defined types were placed in this encounter.    Patient Instructions  Medication Instructions:  Your physician recommends that you continue on your current medications as directed. Please refer to the Current Medication list given to you today.  *If you need a refill on your cardiac medications before your next appointment, please call your pharmacy*   Lab Work: BMET and CBC in 1 month  If you have labs (blood work) drawn today and your tests are completely normal, you will receive your results only by: [place] (if you have MyChart) OR A paper copy in the mail If you have any lab test that is abnormal or we need to change your treatment, we will call you to review the results.   Testing/Procedures: Your physician has requested that you have a lexiscan  myoview. For further information please visit [email or web address]. Please follow instruction sheet, as given.   Follow-Up: At [facility], you and your health needs are our priority.  As part of our continuing mission to provide you with exceptional heart care, we have created designated Provider Care Teams.  These Care Teams include your primary Cardiologist (physician) and Advanced Practice Providers (APPs -  Physician Assistants and Nurse Practitioners) who all work together to provide you with the care you need, when you need it.  We recommend signing up for the patient portal called "MyChart".  Sign up information is provided on this After Visit Summary.  MyChart is used to connect with patients for Virtual Visits (Telemedicine).  Patients are able to view lab/test results, encounter notes, upcoming appointments, etc.  Non-urgent messages can be sent to your provider as well.   To learn more about what you can do with MyChart, go to [email or web address].    Your next appointment:   6 month(s)  The format for your next appointment:   In Person  Provider:   You will see one of the following Advanced Practice Providers on your designated Care Team:   Richardson Dopp, PA-C Robbie Lis, [place]   Other Instructions     Signed, Mertie Moores, MD  01/30/2021 5:15 PM    [place]

## 2021-02-01 ENCOUNTER — Telehealth (HOSPITAL_COMMUNITY): Payer: Self-pay

## 2021-02-01 NOTE — Telephone Encounter (Signed)
Spoke with the patient, detailed instructions given. She stated that she would be here for her test. Asked to call back with any questions. S.Luby Seamans EMTP 

## 2021-02-02 ENCOUNTER — Other Ambulatory Visit: Payer: Self-pay | Admitting: Cardiovascular Disease

## 2021-02-03 LAB — BASIC METABOLIC PANEL
BUN/Creatinine Ratio: 22 (ref 12–28)
BUN: 13 mg/dL (ref 8–27)
CO2: 27 mmol/L (ref 20–29)
Calcium: 10 mg/dL (ref 8.7–10.3)
Chloride: 100 mmol/L (ref 96–106)
Creatinine, Ser: 0.58 mg/dL (ref 0.57–1.00)
Glucose: 94 mg/dL (ref 70–99)
Potassium: 4.1 mmol/L (ref 3.5–5.2)
Sodium: 140 mmol/L (ref 134–144)
eGFR: 94 mL/min/{1.73_m2} (ref >59)

## 2021-02-03 LAB — CBC
Hematocrit: 43 % (ref 34.0–46.6)
Hemoglobin: 13.9 g/dL (ref 11.1–15.9)
MCH: 29.8 pg (ref 26.6–33.0)
MCHC: 32.3 g/dL (ref 31.5–35.7)
MCV: 92 fL (ref 79–97)
Platelets: 302 10*3/uL (ref 150–450)
RBC: 4.66 x10E6/uL (ref 3.77–5.28)
RDW: 13 % (ref 11.7–15.4)
WBC: 7.4 10*3/uL (ref 3.4–10.8)

## 2021-02-06 ENCOUNTER — Ambulatory Visit (HOSPITAL_COMMUNITY): Payer: Medicare HMO | Attending: Internal Medicine

## 2021-02-06 ENCOUNTER — Other Ambulatory Visit: Payer: Self-pay

## 2021-02-06 DIAGNOSIS — R079 Chest pain, unspecified: Secondary | ICD-10-CM | POA: Insufficient documentation

## 2021-02-06 LAB — MYOCARDIAL PERFUSION IMAGING
LV dias vol: 43 mL (ref 46–106)
LV sys vol: 10 mL
Nuc Stress EF: 76 %
Peak HR: 83 {beats}/min
Rest HR: 62 {beats}/min
Rest Nuclear Isotope Dose: 10.1 mCi
SDS: 3
SRS: 1
SSS: 4
ST Depression (mm): 0 mm
Stress Nuclear Isotope Dose: 29.6 mCi
TID: 0.94

## 2021-02-06 MED ORDER — TECHNETIUM TC 99M TETROFOSMIN IV KIT
10.1000 | PACK | Freq: Once | INTRAVENOUS | Status: AC | PRN
  Administered 2021-02-06: 10.1 via INTRAVENOUS
  Filled 2021-02-06: qty 11

## 2021-02-06 MED ORDER — TECHNETIUM TC 99M TETROFOSMIN IV KIT
29.6000 | PACK | Freq: Once | INTRAVENOUS | Status: AC | PRN
  Administered 2021-02-06: 29.6 via INTRAVENOUS
  Filled 2021-02-06: qty 30

## 2021-02-06 MED ORDER — REGADENOSON 0.4 MG/5ML IV SOLN
0.4000 mg | Freq: Once | INTRAVENOUS | Status: AC
  Administered 2021-02-06: 0.4 mg via INTRAVENOUS

## 2021-02-27 ENCOUNTER — Encounter (HOSPITAL_COMMUNITY): Payer: Self-pay | Admitting: Radiology

## 2021-03-06 ENCOUNTER — Other Ambulatory Visit: Payer: Self-pay

## 2021-03-06 ENCOUNTER — Other Ambulatory Visit: Payer: Medicare HMO | Admitting: *Deleted

## 2021-03-06 DIAGNOSIS — Z7901 Long term (current) use of anticoagulants: Secondary | ICD-10-CM

## 2021-03-06 LAB — CBC
Hematocrit: 34.9 % (ref 34.0–46.6)
Hemoglobin: 11.7 g/dL (ref 11.1–15.9)
MCH: 30.2 pg (ref 26.6–33.0)
MCHC: 33.5 g/dL (ref 31.5–35.7)
MCV: 90 fL (ref 79–97)
Platelets: 251 10*3/uL (ref 150–450)
RBC: 3.88 x10E6/uL (ref 3.77–5.28)
RDW: 13 % (ref 11.7–15.4)
WBC: 9.1 10*3/uL (ref 3.4–10.8)

## 2021-03-06 LAB — BASIC METABOLIC PANEL
BUN/Creatinine Ratio: 31 — ABNORMAL HIGH (ref 12–28)
BUN: 15 mg/dL (ref 8–27)
CO2: 27 mmol/L (ref 20–29)
Calcium: 9.8 mg/dL (ref 8.7–10.3)
Chloride: 99 mmol/L (ref 96–106)
Creatinine, Ser: 0.49 mg/dL — ABNORMAL LOW (ref 0.57–1.00)
Glucose: 85 mg/dL (ref 70–99)
Potassium: 3.9 mmol/L (ref 3.5–5.2)
Sodium: 139 mmol/L (ref 134–144)
eGFR: 98 mL/min/{1.73_m2} (ref >59)

## 2021-04-02 ENCOUNTER — Other Ambulatory Visit: Payer: Self-pay

## 2021-04-02 MED ORDER — APIXABAN 5 MG PO TABS: 5.0000 mg | ORAL_TABLET | Freq: Two times a day (BID) | ORAL | 5 refills | Status: DC

## 2021-04-02 NOTE — Telephone Encounter (Signed)
Prescription refill request for Eliquis received. Indication: afib  Last office visit: [place], 01/30/2021 Scr: 0.49, 03/06/2021 Age: 75 yo  Weight: 40 kg   Refill sent.

## 2021-04-30 ENCOUNTER — Ambulatory Visit: Payer: Medicare HMO | Admitting: Cardiovascular Disease

## 2021-05-23 ENCOUNTER — Ambulatory Visit: Payer: Medicare HMO | Admitting: Internal Medicine

## 2021-05-23 ENCOUNTER — Encounter: Payer: Self-pay | Admitting: Internal Medicine

## 2021-05-23 ENCOUNTER — Other Ambulatory Visit: Payer: Medicare HMO

## 2021-05-23 VITALS — BP 136/56 | HR 74 | Ht 66.0 in | Wt 90.0 lb

## 2021-05-23 DIAGNOSIS — K5909 Other constipation: Secondary | ICD-10-CM

## 2021-05-23 DIAGNOSIS — Z681 Body mass index (BMI) 19 or less, adult: Secondary | ICD-10-CM

## 2021-05-23 DIAGNOSIS — Z68.41 Body mass index (BMI) pediatric, greater than or equal to 95th percentile for age: Secondary | ICD-10-CM

## 2021-05-23 DIAGNOSIS — R140 Abdominal distension (gaseous): Secondary | ICD-10-CM

## 2021-05-23 DIAGNOSIS — R636 Underweight: Secondary | ICD-10-CM

## 2021-05-23 NOTE — Patient Instructions (Signed)
Your provider has requested that you go to the basement level for lab work before leaving today. Press "B" on the elevator. The lab is located at the first door on the left as you exit the elevator.  Due to recent changes in healthcare laws, you may see the results of your imaging and laboratory studies on MyChart before your provider has had a chance to review them.  We understand that in some cases there may be results that are confusing or concerning to you. Not all laboratory results come back in the same time frame and the provider may be waiting for multiple results in order to interpret others.  Please give [place] 48 hours in order for your provider to thoroughly review all the results before contacting the office for clarification of your results.   Continue to use prunes and fruit to relieve constipation.  I appreciate the opportunity to care for you. Stan Head, MD, [facility]

## 2021-05-23 NOTE — Progress Notes (Signed)
Kristina Dennis 76 y.o. [birth year] [id number]  Assessment & Plan:   Encounter Diagnoses  Name Primary?   Other constipation Yes   Bloating    Body bass index (BMI) of 120% to less than 140% of 95th percentile for age in pediatric patient    Underweight    I am going to check her for celiac disease.  She is underweight we discussed that.  She is not otherwise unhealthy that I can tell.  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  Small bowel biopsies did not show any villous atrophy.  She will use prunes and fruit etc. to try to relieve constipation that she has been doing.   Subjective:   Chief Complaint: Constipation  HPI 76 year old white woman with a history of globus and IBS and family history of colon cancer here because of constipation issues.  Had a colonoscopy in 2018 that was negative and no further routine colonoscopy recommended.  She has some pellet-like stools that she does not eat enough fruits and vegetables.  She thought about not coming because she thinks she understands that if she has enough fruits and vegetables in her diet that she is okay but her husband recommended she come see me. \Diet history is reviewed, she drinks a lot of almond milk every day she eats peanut butter and a ham sandwich on home baked bread for lunch typically and she has rotating meals in the evening of meat plus potato plus vegetables, spaghetti with meatballs, chicken plus green vegetables, and chicken soup.  She does all of her own cooking.  An EGD in 2021 because of the globus sensation and the cough had an irregular Z-line that I biopsied looking for esophagitis and though it showed an intestinal metaplasia the remainder the exam was normal.  Wt Readings from Last 3 Encounters:  05/23/21 90 lb (40.8 kg)  02/06/21 90 lb (40.8 kg)  01/30/21 90 lb 3.2 oz (40.9 kg)    Allergies   Allergen Reactions   Hydrocodone Nausea And Vomiting   Amlodipine     Severe all over joint pain   Azithromycin Nausea Only   Excedrin Extra Strength [Aspirin-Acetaminophen-Caffeine] Nausea Only    Extreme nausea   Niacin Nausea And Vomiting   Niacin And Related Nausea And Vomiting   Sulfa Antibiotics Nausea And Vomiting and Other (See Comments)    Violently ill   Sulfasalazine Nausea And Vomiting    Violently ill   Latex Rash   Current Meds  Medication Sig   apixaban (ELIQUIS) 5 MG TABS tablet Take 1 tablet (5 mg total) by mouth 2 (two) times daily.   Ascorbic Acid (VITAMIN C) 1000 MG tablet Take 1,000 mg by mouth.   Cholecalciferol (VITAMIN D-3 PO) Take 1 capsule by mouth 3 (three) times a week.   Magnesium 200 MG TABS Take by mouth.   omeprazole (PRILOSEC) 20 MG capsule Take 20 mg by mouth daily.   rosuvastatin (CRESTOR) 20 MG tablet Take 20 mg by mouth daily.   Past Medical History:  Diagnosis Date   Arthritis    Colitis 2003   per path report from 2003: non-specific.  normal colonoscopy in 04/2011.    GERD (gastroesophageal reflux disease)    Hypertension    Irritable bowel syndrome    Osteoporosis    Palpitations    Pneumonia    hx  Rhinitis    Past Surgical History:  Procedure Laterality Date   BUNIONECTOMY Left    COLONOSCOPY     DILATION AND CURETTAGE OF UTERUS     ESOPHAGOGASTRODUODENOSCOPY  2021   KNEE SURGERY Right    arthroscopy   ORIF WRIST FRACTURE Left 12/13/2013   Procedure: OPEN REDUCTION INTERNAL FIXATION (ORIF) WRIST FRACTURE;  Surgeon: Meredith Pel, MD;  Location: [facility];  Service: Orthopedics;  Laterality: Left;   TONSILLECTOMY     TUBAL LIGATION     Social History   Social History Narrative   Married to Kristina Dennis-   2 children   Retired   Former smoker no alcohol tobacco or drug use currently   family history includes Arrhythmia in her mother; Cancer in her sister; Colon cancer (age of onset: 72) in her sister; Hypertension in her  father; Rectal cancer (age of onset: 30) in her sister.   Review of Systems As above  Objective:   Physical Exam BP (!) 136/56    Pulse 74    Ht 5\' 6"  (1.676 m)    Wt 90 lb (40.8 kg)    LMP 05/03/1987    BMI 14.53 kg/m  Thin pleasant elderly white woman in no acute distress Abdomen is soft flat and nontender no organomegaly or mass

## 2021-05-24 LAB — IGA: Immunoglobulin A: 151 mg/dL (ref 70–320)

## 2021-05-24 LAB — TISSUE TRANSGLUTAMINASE, IGA: (tTG) Ab, IgA: <1 U/mL

## 2021-09-30 ENCOUNTER — Encounter: Payer: Self-pay | Admitting: Cardiovascular Disease

## 2021-09-30 NOTE — Progress Notes (Unsigned)
Cardiology Office Note:    Date:  10/01/2021   ID:  Kristina Dennis, DOB May 24, 1945, MRN [id number]  PCP:  Everardo Beals, NP  Cardiologist:  Mertie Moores, MD  Electrophysiologist:  None   Referring MD: Everardo Beals, NP   Chief Complaint  Patient presents with   coronary arter calcification   Chest Pain      Feb. 10, 2020    Kristina Dennis is a 76 y.o. female with a hx of  HTN.   I met Kristina Dennis in 2012 and have not seen her since then.  She was having issues with palpitations We were asked by Dr. Forde Dandy to see her coronary artery calcifications that were noted incidentally on a chest CT from Oct 17, 2017.  No hx of CAD ( father died of a brain aneurism, mother died of a broken neck )   Other members died of accidents and cancer.  She denies having any episodes of chest pain.  She does exercise on a regular basis  ( physical therapy and light weights )    Does not do cardio exercise .   Has foot issues/   Takes amlodipine on a PRN basis  - ends up taking it every week or 2 .   January 30, 2021: Kristina Dennis is seen today for follow-up of her hypertension.  She apparently was found to have atrial fibrillation.  On October 6 she developed palpitations.  She presented to the emergency room and was found to have atrial fibrillation with rapid ventricular response.  Episode resolved on its own. Troponin level was 70 and then increased to 137.  Episode lasted for 1-2 hours.  No CP , no dyspnea Associated with some left arm ache. No chest ache.  She had some palpitations in 2020, Myoivew was normal at time  No cardiac Does Tai chi  October 01, 2021 Kristina Dennis is seen for follow up of her PAF, HTN. Having issues with lots of fatigue  Is exercising .   Rides a stationary bike 3 days a week .   Is frustrated about having the dx of Atrial fib Says she "waits every day to see if she is going to die or have a stroke" Doesn't like taking the Eliquis because this reminds her  that she could die at any minute.  Seems to have lots of anxiety over her condition      Past Medical History:  Diagnosis Date   Arthritis    Colitis 2003   per path report from 2003: non-specific.  normal colonoscopy in 04/2011.    GERD (gastroesophageal reflux disease)    Hypertension    Irritable bowel syndrome    Osteoporosis    Palpitations    Pneumonia    hx   Rhinitis     Past Surgical History:  Procedure Laterality Date   BUNIONECTOMY Left    COLONOSCOPY     DILATION AND CURETTAGE OF UTERUS     ESOPHAGOGASTRODUODENOSCOPY  2021   KNEE SURGERY Right    arthroscopy   ORIF WRIST FRACTURE Left 12/13/2013   Procedure: OPEN REDUCTION INTERNAL FIXATION (ORIF) WRIST FRACTURE;  Surgeon: Meredith Pel, MD;  Location: [facility];  Service: Orthopedics;  Laterality: Left;   TONSILLECTOMY     TUBAL LIGATION      Current Medications: Current Meds  Medication Sig   Ascorbic Acid (VITAMIN C) 1000 MG tablet Take 1,000 mg by mouth.   Cholecalciferol (VITAMIN D-3 PO) Take 1 capsule by mouth 3 (three)  times a week.   Coenzyme Q10 (CO Q 10) 10 MG CAPS Take 1 capsule by mouth daily in the afternoon.   ELDERBERRY PO Take 1 capsule by mouth daily in the afternoon.   Magnesium 200 MG TABS Take by mouth.   omeprazole (PRILOSEC) 20 MG capsule Take 20 mg by mouth daily.   rosuvastatin (CRESTOR) 20 MG tablet Take 20 mg by mouth daily.   VITAMIN E PO Take 1 tablet by mouth daily.   [DISCONTINUED] apixaban (ELIQUIS) 5 MG TABS tablet Take 1 tablet (5 mg total) by mouth 2 (two) times daily.     Allergies:   Hydrocodone, Amlodipine, Azithromycin, Excedrin extra strength [aspirin-acetaminophen-caffeine], Niacin, Niacin and related, Sulfa antibiotics, Sulfasalazine, and Latex   Social History   Socioeconomic History   Marital status: Married    Spouse name: Not on file   Number of children: 2   Years of education: Not on file   Highest education level: Not on file  Occupational History    Occupation: Retired  Tobacco Use   Smoking status: Former    Packs/day: 0.50    Years: 50.00    Total pack years: 25.00    Types: Cigarettes    Quit date: 2020    Years since quitting: 3.4   Smokeless tobacco: Never  Vaping Use   Vaping Use: Never used  Substance and Sexual Activity   Alcohol use: No    Alcohol/week: 0.0 standard drinks of alcohol   Drug use: No   Sexual activity: Not on file  Other Topics Concern   Not on file  Social History Narrative   Married to [place]-   2 children   Retired   Former smoker no alcohol tobacco or drug use currently   Social Determinants of Radio broadcast assistant Strain: Not on Art therapist Insecurity: Not on file  Transportation Needs: Not on file  Physical Activity: Not on file  Stress: Not on file  Social Connections: Not on file     Family History: The patient's family history includes Arrhythmia in her mother; Cancer in her sister; Colon cancer (age of onset: 56) in her sister; Hypertension in her father; Rectal cancer (age of onset: 4) in her sister. There is no history of Pancreatic cancer, Esophageal cancer, or Stomach cancer.  ROS:   Please see the history of present illness.     All other systems reviewed and are negative.  EKGs/Labs/Other Studies Reviewed:    The following studies were reviewed today:   EKG:     Recent Labs: 03/06/2021: BUN 15; Creatinine, Ser 0.49; Hemoglobin 11.7; Platelets 251; Potassium 3.9; Sodium 139  Recent Lipid Panel No results found for: "CHOL", "TRIG", "HDL", "CHOLHDL", "VLDL", "LDLCALC", "LDLDIRECT"  Physical Exam:     Physical Exam: Blood pressure 118/66, pulse 98, height 5' 5.5" (1.664 m), weight 90 lb 9.6 oz (41.1 kg), last menstrual period 05/03/1987, SpO2 98 %.  GEN:  Well nourished, well developed in no acute distress HEENT: Normal NECK: No JVD; No carotid bruits LYMPHATICS: No lymphadenopathy CARDIAC: RRR ,  soft systolic murmur  RESPIRATORY:  Clear to  auscultation without rales, wheezing or rhonchi  ABDOMEN: Soft, non-tender, non-distended MUSCULOSKELETAL:  No edema; No deformity  SKIN: Warm and dry NEUROLOGIC:  Alert and oriented x 3    ASSESSMENT:    1. Paroxysmal atrial fibrillation (HCC)   2. Anticoagulant long-term use      PLAN:       1.  Left arm  pain: She has a history of coronary artery calcifications:  .    Paroxysmal atrial fibrillation:  She is very upset about her atrial fib and having to take meds.  She agreeing to continue to take the eliquis . Is not happy about it   I've again explained that Afib is a nuisance issue and that we are doing all that we can in reducing her risk of stroke by having her on Eliquis.  I told her that I had thousands of patients over the years who have lived with atrial fibrillation and have done quite well.   I have reminded her that the important thing is that she continues to take Eliquis to reduce her risk of stroke.  In the end, she agreed that taking the Eliquis seemed to make the most sense given her hx of atrial fib .   Will have her follow up with app in a year          Medication Adjustments/Labs and Tests Ordered: Current medicines are reviewed at length with the patient today.  Concerns regarding medicines are outlined above.  Orders Placed This Encounter  Procedures   Basic metabolic panel   TSH   CBC    No orders of the defined types were placed in this encounter.     Patient Instructions  Medication Instructions:  Your physician recommends that you continue on your current medications as directed. Please refer to the Current Medication list given to you today.  *If you need a refill on your cardiac medications before your next appointment, please call your pharmacy*   Lab Work: TSH, BMET, CBC Today If you have labs (blood work) drawn today and your tests are completely normal, you will receive your results only by: [place] (if you have  MyChart) OR A paper copy in the mail If you have any lab test that is abnormal or we need to change your treatment, we will call you to review the results.   Testing/Procedures: NONE   Follow-Up: At [facility], you and your health needs are our priority.  As part of our continuing mission to provide you with exceptional heart care, we have created designated Provider Care Teams.  These Care Teams include your primary Cardiologist (physician) and Advanced Practice Providers (APPs -  Physician Assistants and Nurse Practitioners) who all work together to provide you with the care you need, when you need it.  Your next appointment:   1 year(s)  The format for your next appointment:   In Person  Provider:   Richardson Dopp or Christen Bame {     Important Information About Sugar         Signed, Mertie Moores, MD  10/01/2021 1:13 PM    [place]

## 2021-10-01 ENCOUNTER — Ambulatory Visit: Payer: Medicare HMO | Admitting: Cardiovascular Disease

## 2021-10-01 ENCOUNTER — Other Ambulatory Visit: Payer: Self-pay | Admitting: Cardiovascular Disease

## 2021-10-01 ENCOUNTER — Encounter: Payer: Self-pay | Admitting: Cardiovascular Disease

## 2021-10-01 VITALS — BP 118/66 | HR 98 | Ht 65.5 in | Wt 90.6 lb

## 2021-10-01 DIAGNOSIS — I480 Paroxysmal atrial fibrillation: Secondary | ICD-10-CM | POA: Diagnosis not present

## 2021-10-01 DIAGNOSIS — Z7901 Long term (current) use of anticoagulants: Secondary | ICD-10-CM

## 2021-10-01 NOTE — Patient Instructions (Signed)
Medication Instructions:  Your physician recommends that you continue on your current medications as directed. Please refer to the Current Medication list given to you today.  *If you need a refill on your cardiac medications before your next appointment, please call your pharmacy*   Lab Work: TSH, BMET, CBC Today If you have labs (blood work) drawn today and your tests are completely normal, you will receive your results only by: [place] (if you have MyChart) OR A paper copy in the mail If you have any lab test that is abnormal or we need to change your treatment, we will call you to review the results.   Testing/Procedures: NONE   Follow-Up: At [facility], you and your health needs are our priority.  As part of our continuing mission to provide you with exceptional heart care, we have created designated Provider Care Teams.  These Care Teams include your primary Cardiologist (physician) and Advanced Practice Providers (APPs -  Physician Assistants and Nurse Practitioners) who all work together to provide you with the care you need, when you need it.  Your next appointment:   1 year(s)  The format for your next appointment:   In Person  Provider:   Richardson Dopp or Christen Bame {     Important Information About Sugar

## 2021-10-01 NOTE — Telephone Encounter (Signed)
Prescription refill request for Eliquis received. Indication: PAF Last office visit: 10/01/21  P Nahser MD Scr: 0.49 on 03/06/21.  Labs repeated today Age: 76 Weight: 41.1kg  Based on above findings Eliquis 5mg  twice daily is the appropriate dose.  Refill approved.

## 2021-10-02 LAB — BASIC METABOLIC PANEL
BUN/Creatinine Ratio: 39 — ABNORMAL HIGH (ref 12–28)
BUN: 19 mg/dL (ref 8–27)
CO2: 26 mmol/L (ref 20–29)
Calcium: 9.9 mg/dL (ref 8.7–10.3)
Chloride: 98 mmol/L (ref 96–106)
Creatinine, Ser: 0.49 mg/dL — ABNORMAL LOW (ref 0.57–1.00)
Glucose: 91 mg/dL (ref 70–99)
Potassium: 4.2 mmol/L (ref 3.5–5.2)
Sodium: 137 mmol/L (ref 134–144)
eGFR: 98 mL/min/{1.73_m2} (ref >59)

## 2021-10-02 LAB — TSH: TSH: 1.11 u[IU]/mL (ref 0.450–4.500)

## 2021-10-02 LAB — CBC
Hematocrit: 39.6 % (ref 34.0–46.6)
Hemoglobin: 13 g/dL (ref 11.1–15.9)
MCH: 29.3 pg (ref 26.6–33.0)
MCHC: 32.8 g/dL (ref 31.5–35.7)
MCV: 89 fL (ref 79–97)
Platelets: 311 10*3/uL (ref 150–450)
RBC: 4.43 x10E6/uL (ref 3.77–5.28)
RDW: 12.9 % (ref 11.7–15.4)
WBC: 10.1 10*3/uL (ref 3.4–10.8)

## 2021-12-13 ENCOUNTER — Telehealth: Payer: Self-pay | Admitting: Cardiovascular Disease

## 2021-12-13 NOTE — Telephone Encounter (Signed)
Pt c/o medication issue:  1. Name of Medication: ELIQUIS 5 MG TABS tablet  2. How are you currently taking this medication (dosage and times per day)? TAKE 1 TABLET BY MOUTH TWICE A DAY  3. Are you having a reaction (difficulty breathing--STAT)? no  4. What is your medication issue? Patient calling to say the pharmacy is not longer providing her with the medication. Please advise

## 2021-12-13 NOTE — Telephone Encounter (Signed)
Corporate with [facility] sent out a letter that auto fill will not longer be done for this type of medication. The patient just has to call in for the drug when she needs a refill. Staff will go ahead and fill hers now.   Called and advised the patient.

## 2021-12-20 NOTE — Progress Notes (Signed)
Cardiology Office Note:    Date:  12/25/2021   ID:  Kristina Dennis, DOB 01/08/1946, MRN [id number]  PCP:  Everardo Beals, NP  Cardiologist:  Mertie Moores, MD  Electrophysiologist:  None   Referring MD: Everardo Beals, NP   Chief Complaint  Patient presents with   Atrial Fibrillation        Coronary artery calcifications      Feb. 10, 2020    Kristina Dennis is a 76 y.o. female with a hx of  HTN.   I met Kristina Dennis in 2012 and have not seen her since then.  She was having issues with palpitations We were asked by Dr. Forde Dandy to see her coronary artery calcifications that were noted incidentally on a chest CT from October 21, 2017.  No hx of CAD ( father died of a brain aneurism, mother died of a broken neck )   Other members died of accidents and cancer.  She denies having any episodes of chest pain.  She does exercise on a regular basis  ( physical therapy and light weights )    Does not do cardio exercise .   Has foot issues/   Takes amlodipine on a PRN basis  - ends up taking it every week or 2 .   January 30, 2021: Kristina Dennis is seen today for follow-up of her hypertension.  She apparently was found to have atrial fibrillation.  On October 6 she developed palpitations.  She presented to the emergency room and was found to have atrial fibrillation with rapid ventricular response.  Episode resolved on its own. Troponin level was 70 and then increased to 137.  Episode lasted for 1-2 hours.  No CP , no dyspnea Associated with some left arm ache. No chest ache.  She had some palpitations in 2020, Myoivew was normal at time  No cardiac Does Tai chi  October 01, 2021 Kristina Dennis is seen for follow up of her PAF, HTN. Having issues with lots of fatigue  Is exercising .   Rides a stationary bike 3 days a week .   Is frustrated about having the dx of Atrial fib Says she "waits every day to see if she is going to die or have a stroke" Doesn't like taking the Eliquis because  this reminds her that she could die at any minute.  Seems to have lots of anxiety over her condition   December 25, 2021: Kristina Dennis is seen today for follow-up of her paroxysmal atrial fibrillation, hypertension. Has lots of issues with anxiety   Has been exercising  some -  Is feeling well     Past Medical History:  Diagnosis Date   Arthritis    Colitis 2003   per path report from 2003: non-specific.  normal colonoscopy in 04/2011.    GERD (gastroesophageal reflux disease)    Hypertension    Irritable bowel syndrome    Osteoporosis    Palpitations    Pneumonia    hx   Rhinitis     Past Surgical History:  Procedure Laterality Date   BUNIONECTOMY Left    COLONOSCOPY     DILATION AND CURETTAGE OF UTERUS     ESOPHAGOGASTRODUODENOSCOPY  2021   KNEE SURGERY Right    arthroscopy   ORIF WRIST FRACTURE Left 12/13/2013   Procedure: OPEN REDUCTION INTERNAL FIXATION (ORIF) WRIST FRACTURE;  Surgeon: Meredith Pel, MD;  Location: [place];  Service: Orthopedics;  Laterality: Left;   TONSILLECTOMY     TUBAL LIGATION  Current Medications: Current Meds  Medication Sig   Ascorbic Acid (VITAMIN C) 1000 MG tablet Take 1,000 mg by mouth.   Cholecalciferol (VITAMIN D-3 PO) Take 1 capsule by mouth 3 (three) times a week.   Coenzyme Q10 (CO Q 10) 10 MG CAPS Take 1 capsule by mouth daily in the afternoon.   ELDERBERRY PO Take 1 capsule by mouth daily in the afternoon.   ELIQUIS 5 MG TABS tablet TAKE 1 TABLET BY MOUTH TWICE A DAY   Magnesium 200 MG TABS Take by mouth.   VITAMIN E PO Take 1 tablet by mouth 3 (three) times daily.     Allergies:   Hydrocodone, Amlodipine, Azithromycin, Excedrin extra strength [aspirin-acetaminophen-caffeine], Niacin, Niacin and related, Sulfa antibiotics, Sulfasalazine, and Latex   Social History   Socioeconomic History   Marital status: Married    Spouse name: Not on file   Number of children: 2   Years of education: Not on file   Highest  education level: Not on file  Occupational History   Occupation: Retired  Tobacco Use   Smoking status: Former    Packs/day: 0.50    Years: 50.00    Total pack years: 25.00    Types: Cigarettes    Quit date: 2020    Years since quitting: 3.6   Smokeless tobacco: Never  Vaping Use   Vaping Use: Never used  Substance and Sexual Activity   Alcohol use: No    Alcohol/week: 0.0 standard drinks of alcohol   Drug use: No   Sexual activity: Not on file  Other Topics Concern   Not on file  Social History Narrative   Married to [place]-   2 children   Retired   Former smoker no alcohol tobacco or drug use currently   Social Determinants of Radio broadcast assistant Strain: Not on Art therapist Insecurity: Not on file  Transportation Needs: Not on file  Physical Activity: Not on file  Stress: Not on file  Social Connections: Not on file     Family History: The patient's family history includes Arrhythmia in her mother; Cancer in her sister; Colon cancer (age of onset: 50) in her sister; Hypertension in her father; Rectal cancer (age of onset: 42) in her sister. There is no history of Pancreatic cancer, Esophageal cancer, or Stomach cancer.  ROS:   Please see the history of present illness.     All other systems reviewed and are negative.  EKGs/Labs/Other Studies Reviewed:    The following studies were reviewed today:   EKG:   December 25, 2021: Normal sinus rhythm at 68.  Normal EKG.  Recent Labs: 10/01/2021: BUN 19; Creatinine, Ser 0.49; Hemoglobin 13.0; Platelets 311; Potassium 4.2; Sodium 137; TSH 1.110  Recent Lipid Panel No results found for: "CHOL", "TRIG", "HDL", "CHOLHDL", "VLDL", "LDLCALC", "LDLDIRECT"  Physical Exam:    Physical Exam: Blood pressure 122/70, pulse 68, height '5\' 6"'  (1.676 m), weight 89 lb 6.4 oz (40.6 kg), last menstrual period 05/03/1987, SpO2 99 %.       GEN:  Well nourished, well developed in no acute distress HEENT: Normal NECK: No JVD;  No carotid bruits LYMPHATICS: No lymphadenopathy CARDIAC: RRR , no murmurs, rubs, gallops RESPIRATORY:  Clear to auscultation without rales, wheezing or rhonchi  ABDOMEN: Soft, non-tender, non-distended MUSCULOSKELETAL:  No edema; No deformity  SKIN: Warm and dry NEUROLOGIC:  Alert and oriented x 3   ASSESSMENT:    1. Paroxysmal atrial fibrillation (HCC)  PLAN:       1.  Left arm pain: She has a history of coronary artery calcifications:  .    Paroxysmal atrial fibrillation:   She has not had any recurrent episodes of atrial fibrillation.  She was asking whether or not an implantable loop recorder would allow her to stop the Eliquis.  Unfortunately would not use implantation of a loop recorder to determine whether or not she is going to stay on Eliquis.  Since we have identified that she has A-fib I think that she needs to remain on a DOAC.  It seems that one of her main concerns is the cost of the Eliquis.  I given her a list of the available DOAC's.  She will check the price and will get back to [place] if there is 1 that is cheaper.       Medication Adjustments/Labs and Tests Ordered: Current medicines are reviewed at length with the patient today.  Concerns regarding medicines are outlined above.  Orders Placed This Encounter  Procedures   EKG 12-Lead    No orders of the defined types were placed in this encounter.     Patient Instructions  Check / compare  the price of the following  Eliquis 5 mg tiwce a day  Xarelto 20 mg once a day Pradaxa 150 mg twice a day  Savaysa 60 mg a day   Medication Instructions:  Your physician recommends that you continue on your current medications as directed. Please refer to the Current Medication list given to you today.  *If you need a refill on your cardiac medications before your next appointment, please call your pharmacy*   Lab Work: NONE If you have labs (blood work) drawn today and your tests are completely  normal, you will receive your results only by: [place] (if you have MyChart) OR A paper copy in the mail If you have any lab test that is abnormal or we need to change your treatment, we will call you to review the results.   Testing/Procedures: NONE   Follow-Up: At [facility], you and your health needs are our priority.  As part of our continuing mission to provide you with exceptional heart care, we have created designated Provider Care Teams.  These Care Teams include your primary Cardiologist (physician) and Advanced Practice Providers (APPs -  Physician Assistants and Nurse Practitioners) who all work together to provide you with the care you need, when you need it.  We recommend signing up for the patient portal called "MyChart".  Sign up information is provided on this After Visit Summary.  MyChart is used to connect with patients for Virtual Visits (Telemedicine).  Patients are able to view lab/test results, encounter notes, upcoming appointments, etc.  Non-urgent messages can be sent to your provider as well.   To learn more about what you can do with MyChart, go to [email or web address].    Your next appointment:   1 year(s)  The format for your next appointment:   In Person  Provider:   Mertie Moores, MD        Important Information About Sugar         Signed, Mertie Moores, MD  12/25/2021 5:45 PM    [place]

## 2021-12-25 ENCOUNTER — Encounter: Payer: Self-pay | Admitting: Cardiovascular Disease

## 2021-12-25 ENCOUNTER — Ambulatory Visit: Payer: Medicare HMO | Attending: Cardiovascular Disease | Admitting: Cardiovascular Disease

## 2021-12-25 VITALS — BP 122/70 | HR 68 | Ht 66.0 in | Wt 89.4 lb

## 2021-12-25 DIAGNOSIS — I480 Paroxysmal atrial fibrillation: Secondary | ICD-10-CM

## 2021-12-25 NOTE — Patient Instructions (Addendum)
Check / compare  the price of the following  Eliquis 5 mg tiwce a day  Xarelto 20 mg once a day Pradaxa 150 mg twice a day  Savaysa 60 mg a day   Medication Instructions:  Your physician recommends that you continue on your current medications as directed. Please refer to the Current Medication list given to you today.  *If you need a refill on your cardiac medications before your next appointment, please call your pharmacy*   Lab Work: NONE If you have labs (blood work) drawn today and your tests are completely normal, you will receive your results only by: MyChart Message (if you have MyChart) OR A paper copy in the mail If you have any lab test that is abnormal or we need to change your treatment, we will call you to review the results.   Testing/Procedures: NONE   Follow-Up: At [facility], you and your health needs are our priority.  As part of our continuing mission to provide you with exceptional heart care, we have created designated Provider Care Teams.  These Care Teams include your primary Cardiologist (physician) and Advanced Practice Providers (APPs -  Physician Assistants and Nurse Practitioners) who all work together to provide you with the care you need, when you need it.  We recommend signing up for the patient portal called "MyChart".  Sign up information is provided on this After Visit Summary.  MyChart is used to connect with patients for Virtual Visits (Telemedicine).  Patients are able to view lab/test results, encounter notes, upcoming appointments, etc.  Non-urgent messages can be sent to your provider as well.   To learn more about what you can do with MyChart, go to [email or web address].    Your next appointment:   1 year(s)  The format for your next appointment:   In Person  Provider:   Kristeen Miss, MD        Important Information About Sugar

## 2022-01-25 ENCOUNTER — Telehealth: Payer: Self-pay | Admitting: Cardiovascular Disease

## 2022-01-25 NOTE — Telephone Encounter (Signed)
Patient would like to speak to nurse about emergency room visit about her A-Fib. She would like a call back.

## 2022-01-25 NOTE — Telephone Encounter (Signed)
Returned call to patient who was seen yesterday at [facility] ED for A-fib w/RVR and was told to follow-up with cardiology. She states she feels fine, but I informed her since she this was her first episode of RVR, new start Metoprolol, and not at [facility], we really would like to see her in office. Appt scheduled for 01/29/22.

## 2022-01-27 ENCOUNTER — Encounter: Payer: Self-pay | Admitting: Cardiovascular Disease

## 2022-01-27 NOTE — Progress Notes (Unsigned)
Cardiology Office Note:    Date:  01/28/2022   ID:  Kristina Dennis, Scriven 08/15/1945, MRN [id number]  PCP:  Everardo Beals, NP  Cardiologist:  Mertie Moores, MD  Electrophysiologist:  None   Referring MD: Everardo Beals, NP   Chief Complaint  Patient presents with   Atrial Fibrillation      Feb. 10, 2020    Kristina Dennis is a 76 y.o. female with a hx of  HTN.   I met Kristina Dennis in 2012 and have not seen her since then.  She was having issues with palpitations We were asked by Dr. Forde Dandy to see her coronary artery calcifications that were noted incidentally on a chest CT from 10-05-2017.  No hx of CAD ( father died of a brain aneurism, mother died of a broken neck )   Other members died of accidents and cancer.  She denies having any episodes of chest pain.  She does exercise on a regular basis  ( physical therapy and light weights )    Does not do cardio exercise .   Has foot issues/   Takes amlodipine on a PRN basis  - ends up taking it every week or 2 .   January 30, 2021: Kristina Dennis is seen today for follow-up of her hypertension.  She apparently was found to have atrial fibrillation.  On October 6 she developed palpitations.  She presented to the emergency room and was found to have atrial fibrillation with rapid ventricular response.  Episode resolved on its own. Troponin level was 70 and then increased to 137.  Episode lasted for 1-2 hours.  No CP , no dyspnea Associated with some left arm ache. No chest ache.  She had some palpitations in 2020, Myoivew was normal at time  No cardiac Does Tai chi  October 01, 2021 [place] is seen for follow up of her PAF, HTN. Having issues with lots of fatigue  Is exercising .   Rides a stationary bike 3 days a week .   Is frustrated about having the dx of Atrial fib Says she "waits every day to see if she is going to die or have a stroke" Doesn't like taking the Eliquis because this reminds her that she could die at any  minute.  Seems to have lots of anxiety over her condition   December 25 2021: Kristina Dennis is seen today for follow-up of her paroxysmal atrial fibrillation, hypertension. Has lots of issues with anxiety   Has been exercising  some -  Is feeling well    Oct. 9, 2023   Kristina Dennis is seen today for follow up of her atrial fib.    Was having a sore throat.   Her primary MD sent her to the ER. She had evaluation,  was walking out of the ER.   Developed atrial fib .   She walked back into the ER  She converted back to sinus rhythm while she was there. Was started on prednisone  Has not slept wall while on prednisone    Has lots of issues with anxiety  Is tolerating the eliquis  Exercises  Respiratory panel was negative for COVID   Is maintaining NSR .    Past Medical History:  Diagnosis Date   Arthritis    Colitis 2003   per path report from 2003: non-specific.  normal colonoscopy in 04/2011.    GERD (gastroesophageal reflux disease)    Hypertension    Irritable bowel syndrome  Osteoporosis    Palpitations    Pneumonia    hx   Rhinitis     Past Surgical History:  Procedure Laterality Date   BUNIONECTOMY Left    COLONOSCOPY     DILATION AND CURETTAGE OF UTERUS     ESOPHAGOGASTRODUODENOSCOPY  2021   KNEE SURGERY Right    arthroscopy   ORIF WRIST FRACTURE Left 12/13/2013   Procedure: OPEN REDUCTION INTERNAL FIXATION (ORIF) WRIST FRACTURE;  Surgeon: Meredith Pel, MD;  Location: [place];  Service: Orthopedics;  Laterality: Left;   TONSILLECTOMY     TUBAL LIGATION      Current Medications: Current Meds  Medication Sig   Ascorbic Acid (VITAMIN C) 1000 MG tablet Take 1,000 mg by mouth.   benzonatate (TESSALON) 100 MG capsule Take 100 mg by mouth every 8 (eight) hours.   Cholecalciferol (VITAMIN D-3 PO) Take 1 capsule by mouth 3 (three) times a week.   Coenzyme Q10 (CO Q 10) 10 MG CAPS Take 1 capsule by mouth daily in the afternoon.   ELDERBERRY PO Take 1 capsule  by mouth daily in the afternoon.   ELIQUIS 5 MG TABS tablet TAKE 1 TABLET BY MOUTH TWICE A DAY   Magnesium 200 MG TABS Take by mouth.   metoprolol succinate (TOPROL-XL) 25 MG 24 hr tablet Take 25 mg by mouth daily.   predniSONE (DELTASONE) 20 MG tablet Take 20 mg by mouth 2 (two) times daily.   VITAMIN E PO Take 1 tablet by mouth 3 (three) times daily.     Allergies:   Hydrocodone, Amlodipine, Azithromycin, Excedrin extra strength [aspirin-acetaminophen-caffeine], Niacin, Niacin and related, Sulfa antibiotics, Sulfasalazine, and Latex   Social History   Socioeconomic History   Marital status: Married    Spouse name: Not on file   Number of children: 2   Years of education: Not on file   Highest education level: Not on file  Occupational History   Occupation: Retired  Tobacco Use   Smoking status: Former    Packs/day: 0.50    Years: 50.00    Total pack years: 25.00    Types: Cigarettes    Quit date: 2020    Years since quitting: 3.7   Smokeless tobacco: Never  Vaping Use   Vaping Use: Never used  Substance and Sexual Activity   Alcohol use: No    Alcohol/week: 0.0 standard drinks of alcohol   Drug use: No   Sexual activity: Not on file  Other Topics Concern   Not on file  Social History Narrative   Married to [place]-   2 children   Retired   Former smoker no alcohol tobacco or drug use currently   Social Determinants of Radio broadcast assistant Strain: Not on Art therapist Insecurity: Not on file  Transportation Needs: Not on file  Physical Activity: Not on file  Stress: Not on file  Social Connections: Not on file     Family History: The patient's family history includes Arrhythmia in her mother; Cancer in her sister; Colon cancer (age of onset: 21) in her sister; Hypertension in her father; Rectal cancer (age of onset: 12) in her sister. There is no history of Pancreatic cancer, Esophageal cancer, or Stomach cancer.  ROS:   Please see the history of present  illness.     All other systems reviewed and are negative.  EKGs/Labs/Other Studies Reviewed:    The following studies were reviewed today:   EKG:     Recent  Labs: 10/01/2021: BUN 19; Creatinine, Ser 0.49; Hemoglobin 13.0; Platelets 311; Potassium 4.2; Sodium 137; TSH 1.110  Recent Lipid Panel No results found for: "CHOL", "TRIG", "HDL", "CHOLHDL", "VLDL", "LDLCALC", "LDLDIRECT"  Physical Exam:    Physical Exam: Blood pressure 132/80, pulse 68, height '5\' 6"'  (1.676 m), weight 91 lb (41.3 kg), last menstrual period 05/03/1987, SpO2 99 %.       GEN:  elderly female,  thin,  HEENT: Normal NECK: No JVD; No carotid bruits LYMPHATICS: No lymphadenopathy CARDIAC: RRR , no murmurs, rubs, gallops RESPIRATORY:  Clear to auscultation without rales, wheezing or rhonchi  ABDOMEN: Soft, non-tender, non-distended MUSCULOSKELETAL:  No edema; No deformity  SKIN: Warm and dry NEUROLOGIC:  Alert and oriented x 3    ASSESSMENT:    1. Paroxysmal atrial fibrillation (HCC)        PLAN:         -  atrial fib -Pam[place]e had an episode of paroxysmal atrial fibrillation.  She presented to the ER.  She converted back to sinus rhythm shortly thereafter. She was started on metoprolol and is maintained normal sinus rhythm.  Continue with current medications.  We will have her return to see Stephan Minister, NP in 3 months.  -Anxiety: She still has quite a bit of anxiety.  She will continue to see her primary medical doctor.     Medication Adjustments/Labs and Tests Ordered: Current medicines are reviewed at length with the patient today.  Concerns regarding medicines are outlined above.  No orders of the defined types were placed in this encounter.   No orders of the defined types were placed in this encounter.     Patient Instructions  Medication Instructions:  NO CHANGES *If you need a refill on your cardiac medications before your next appointment, please call your  pharmacy*   Lab Work: NONE If you have labs (blood work) drawn today and your tests are completely normal, you will receive your results only by: [place] (if you have MyChart) OR A paper copy in the mail If you have any lab test that is abnormal or we need to change your treatment, we will call you to review the results.   Testing/Procedures: NONE   Follow-Up: At [facility], you and your health needs are our priority.  As part of our continuing mission to provide you with exceptional heart care, we have created designated Provider Care Teams.  These Care Teams include your primary Cardiologist (physician) and Advanced Practice Providers (APPs -  Physician Assistants and Nurse Practitioners) who all work together to provide you with the care you need, when you need it.  We recommend signing up for the patient portal called "MyChart".  Sign up information is provided on this After Visit Summary.  MyChart is used to connect with patients for Virtual Visits (Telemedicine).  Patients are able to view lab/test results, encounter notes, upcoming appointments, etc.  Non-urgent messages can be sent to your provider as well.   To learn more about what you can do with MyChart, go to [email or web address].    Your next appointment:   3 month(s)  The format for your next appointment:   In Person  Provider:   Florene Glen NP   [place], Mertie Moores, MD  01/28/2022 4:47 PM    [place]

## 2022-01-28 ENCOUNTER — Ambulatory Visit: Payer: Medicare HMO | Attending: Cardiovascular Disease | Admitting: Cardiovascular Disease

## 2022-01-28 ENCOUNTER — Encounter: Payer: Self-pay | Admitting: Cardiovascular Disease

## 2022-01-28 VITALS — BP 132/80 | HR 68 | Ht 66.0 in | Wt 91.0 lb

## 2022-01-28 DIAGNOSIS — I480 Paroxysmal atrial fibrillation: Secondary | ICD-10-CM | POA: Diagnosis not present

## 2022-01-28 NOTE — Patient Instructions (Signed)
Medication Instructions:  NO CHANGES *If you need a refill on your cardiac medications before your next appointment, please call your pharmacy*   Lab Work: NONE If you have labs (blood work) drawn today and your tests are completely normal, you will receive your results only by: [place] (if you have MyChart) OR A paper copy in the mail If you have any lab test that is abnormal or we need to change your treatment, we will call you to review the results.   Testing/Procedures: NONE   Follow-Up: At [facility], you and your health needs are our priority.  As part of our continuing mission to provide you with exceptional heart care, we have created designated Provider Care Teams.  These Care Teams include your primary Cardiologist (physician) and Advanced Practice Providers (APPs -  Physician Assistants and Nurse Practitioners) who all work together to provide you with the care you need, when you need it.  We recommend signing up for the patient portal called "MyChart".  Sign up information is provided on this After Visit Summary.  MyChart is used to connect with patients for Virtual Visits (Telemedicine).  Patients are able to view lab/test results, encounter notes, upcoming appointments, etc.  Non-urgent messages can be sent to your provider as well.   To learn more about what you can do with MyChart, go to [email or web address].    Your next appointment:   3 month(s)  The format for your next appointment:   In Person  Provider:   Florene Glen NP   [place]

## 2022-01-31 ENCOUNTER — Telehealth: Payer: Self-pay | Admitting: Cardiovascular Disease

## 2022-01-31 NOTE — Telephone Encounter (Signed)
Pt c/o of Chest Pain: STAT if CP now or developed within 24 hours  1. Are you having CP right now? Yes   2. Are you experiencing any other symptoms (ex. SOB, nausea, vomiting, sweating)? SOB   3. How long have you been experiencing CP? For three days   4. Is your CP continuous or coming and going? Continuous   5. Have you taken Nitroglycerin? No    Pt states she's been having constant chest pain for three days with some SOB.  ?

## 2022-01-31 NOTE — Telephone Encounter (Signed)
Pt called to report that she has been having constant chest pain since last night... she has been coughing non-stop and it has been dry and hacking...it hurts to take a deep breath... she says she is "not doing well"... I have urged her to have someone take her to the ED for revaluation and she says she will have her husband take her.

## 2022-02-13 ENCOUNTER — Telehealth: Payer: Self-pay | Admitting: Cardiovascular Disease

## 2022-02-13 NOTE — Telephone Encounter (Signed)
Pt c/o medication issue:  1. Name of Medication:   metoprolol succinate (TOPROL-XL) 25 MG 24 hr tablet    2. How are you currently taking this medication (dosage and times per day)? Not sure  3. Are you having a reaction (difficulty breathing--STAT)?   4. What is your medication issue? Pt calling because she is a little confused on if she is supposed to be taking this medication and how she is supposed to be taking it, because she said it was prescribed by the hospital and not Dr. Acie Fredrickson.

## 2022-02-14 NOTE — Telephone Encounter (Signed)
Patient states that her HR is a steady 51-52bpm. She states she picked up an RX from her pharmacy yesterday that states to take 1-2 tablets daily and she is concerned to increase it. Advised her that we did not make that change. She states that the MD listed on the RX is "T. Day." She is not sure who that is or why the RX was prescribed by them. I asked her to contact her pharmacy for that information. She confirmed that she was initially started on this medication through ED appt with [facility] and this could be a prescriber in their system. She verbalized understanding that she can use the medication she picked up, but will only take one tablet daily as prescribed by [name]. No further questions.  Per OV note on 01/28/22: -  atrial fib -Kristina Dennis had an episode of paroxysmal atrial fibrillation.  She presented to the ER.  She converted back to sinus rhythm shortly thereafter. She was started on metoprolol and is maintained normal sinus rhythm.   Continue with current medications.  We will have her return to see Stephan Minister, NP in 3 months.

## 2022-03-25 ENCOUNTER — Encounter: Payer: Self-pay | Admitting: Nurse Practitioner

## 2022-03-25 ENCOUNTER — Ambulatory Visit: Payer: Medicare HMO | Admitting: Nurse Practitioner

## 2022-03-25 VITALS — BP 122/82 | HR 76 | Temp 98.4°F | Ht 66.0 in | Wt 87.4 lb

## 2022-03-25 DIAGNOSIS — J449 Chronic obstructive pulmonary disease, unspecified: Secondary | ICD-10-CM

## 2022-03-25 DIAGNOSIS — J189 Pneumonia, unspecified organism: Secondary | ICD-10-CM | POA: Diagnosis not present

## 2022-03-25 DIAGNOSIS — R911 Solitary pulmonary nodule: Secondary | ICD-10-CM | POA: Insufficient documentation

## 2022-03-25 NOTE — Assessment & Plan Note (Signed)
No formal PFTs. 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 She does not have daily symptoms of dyspnea or cough; low symptom burden. Currently not on scheduled bronchodilators. We will order PFTs. Hold off on inhaler therapy for now.

## 2022-03-25 NOTE — Assessment & Plan Note (Addendum)
Currently completing 10 day course of cefdinir. CT imaging from 11/13 read did not mention infiltrates or consolidation. CXR images obtained sometime the week of 11/20 are not available to review today. Records requested. She is clinically improving. Still with some residual fatigue. Working on weight gain. Plan to repeat imaging in 6 weeks to ensure resolution.   Patient Instructions  Complete cefdinir as previously prescribed  Monitor how you're feeling once you come off of your antibiotics - if you develop increased productive cough, new fevers, shortness of breath, increased chest congestion, or feeling more run down, call for sooner follow up or go to the emergency department if symptoms are severe  Repeat chest x ray in 6 weeks to ensure pneumonia has resolved  Pulmonary function testing upon return  Follow up with Kristina Dennis in 6 weeks after PFTs. Next CT chest will be scheduled at this visit, likely sometime in February. If symptoms do not improve or worsen, please contact office for sooner follow up or seek emergency care.

## 2022-03-25 NOTE — Patient Instructions (Addendum)
Complete cefdinir as previously prescribed  Monitor how you're feeling once you come off of your antibiotics - if you develop increased productive cough, new fevers, shortness of breath, increased chest congestion, or feeling more run down, call for sooner follow up or go to the emergency department if symptoms are severe  Repeat chest x ray in 6 weeks to ensure pneumonia has resolved  Pulmonary function testing upon return  Follow up with Dr. Delton Coombes in 6 weeks after PFTs. Next CT chest will be scheduled at this visit, likely sometime in February. If symptoms do not improve or worsen, please contact office for sooner follow up or seek emergency care.

## 2022-03-25 NOTE — Assessment & Plan Note (Signed)
10 mm spiculated RUL nodule from CT chest 11/13. Short term follow up with repeat CT in 3 months. Her recent weight loss and symptoms seem to be more so related to infectious process but will need to be monitored.

## 2022-03-25 NOTE — Progress Notes (Signed)
 @Patient  ID: Kristina Dennis, female    DOB: 30-Nov-1945, 76 y.o.   MRN: [id number]  Chief Complaint  Patient presents with   Follow-up    No c/o     Referring provider: Marva PandaMillsaps, Kimberly, NP  HPI: 76 year old female, former smoker followed for chronic cough and emphysema. She is a patient of Dr. Kavin LeechByrum's and last seen in office 08/23/2020. Past medical history significant for atrial fib on Eliquis.   TEST/EVENTS:  10/15/2017 CT chest wo contrast: ectasia of the ascending thoracic aorta. Atherosclerosis. No LAD. Small less than 5% left apical and tiny basilar pneumothoraces. No tension. 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 Biapical pleuroparenchymal and subpleural opacities, likely atelectasis or scarring. Probable tiny granuloma in LLL 03/04/2022 CT chest wo contrast: emphysema without focal infiltrate. Spiculated 10 mm RUL nodule. 4 mm LUL nodule vs granuloma. Atherosclerosis.   03/25/2022: Today - follow up Patient presents today for follow up after being treated by her PCP for pneumonia. Her illness all started with sore throat and palpitations 10/6 - went to the ED and was diagnosed with a fib. Converted to sinus rhythm on her own. She was started on anticoagulation therapy. She went back to her PCP sometime before Thanksgiving and had a CT scan that showed pneumonia. Treated with an antibiotic that she doesn't recall the name of. She was also treated with steroid injection and prednisone taper. Still felt bad; cough with green productive sputum. She also had pleuritic pain and fevers. She waited a few days then returned to her PCP when symptoms didn't improve, who obtain a chest x ray which showed multifocal pneumonia per her report. Image unavailable today. That was when she was started on 10 day course of cefdinir, which she remains on today. She has three additional days left. CT also showed a nodule, which she was told would need to be follow up  on. Today, she tells me that she is feeling much better. She actually got ready for the first time in a while today. Still feeling fatigued but slowly improving. Cough is minimal and only produces sputum in the morning, which she attributes to her sinus drainage and usually clear to white. No fevers, chills, hemoptysis. Breathing is good - never had any issues with it. She is not currently on any maintenance bronchodilators. She is currently working on putting some weight back on. She lost 8 lb with recent illness. Appetite is ok; she sometimes feels full without having ate much. She is drinking around 3 protein shakes a day.   Allergies  Allergen Reactions   Hydrocodone Nausea And Vomiting   Amlodipine     Severe all over joint pain   Azithromycin Nausea Only   Excedrin Extra Strength [Aspirin-Acetaminophen-Caffeine] Nausea Only    Extreme nausea   Niacin Nausea And Vomiting   Niacin And Related Nausea And Vomiting   Sulfa Antibiotics Nausea And Vomiting and Other (See Comments)    Violently ill   Sulfasalazine Nausea And Vomiting    Violently ill   Latex Rash    Immunization History  Administered Date(s) Administered   Influenza Inj Mdck Quad With Preservative 04/29/2016   Influenza, High Dose Seasonal PF 01/19/2018   Influenza-Unspecified 03/10/2017   Pneumococcal Polysaccharide-23 03/08/2017, 01/19/2018   Td 10/15/2017   Zoster Recombinat (Shingrix) 02/04/2018, 02/05/2018    Past Medical History:  Diagnosis Date   Arthritis    Colitis 2003   per path report from 2003: non-specific.  normal colonoscopy in 04/2011.    GERD (gastroesophageal reflux disease)    Hypertension    Irritable bowel syndrome    Osteoporosis    Palpitations    Pneumonia    hx   Rhinitis     Tobacco History: Social History   Tobacco Use  Smoking Status Former   Packs/day: 0.50   Years: 50.00   Total pack years: 25.00   Types: Cigarettes   Quit date: 2020   Years since quitting: 3.9   Smokeless Tobacco Never   Counseling given: Not Answered   Outpatient Medications Prior to Visit  Medication Sig Dispense Refill   Ascorbic Acid (VITAMIN C) 1000 MG tablet Take 1,000 mg by mouth.     Coenzyme Q10 (CO Q 10) 10 MG CAPS Take 1 capsule by mouth daily in the afternoon.     ELDERBERRY PO Take 1 capsule by mouth daily in the afternoon.     ELIQUIS 5 MG TABS tablet TAKE 1 TABLET BY MOUTH TWICE A DAY 60 tablet 5   Magnesium 200 MG TABS Take by mouth.     metoprolol succinate (TOPROL-XL) 25 MG 24 hr tablet Take 25 mg by mouth daily.     VITAMIN E PO Take 1 tablet by mouth 3 (three) times daily.     benzonatate (TESSALON) 100 MG capsule Take 100 mg by mouth every 8 (eight) hours. (Patient not taking: Reported on 03/25/2022)     Cholecalciferol (VITAMIN D-3 PO) Take 1 capsule by mouth 3 (three) times a week. (Patient not taking: Reported on 03/25/2022)     predniSONE (DELTASONE) 20 MG tablet Take 20 mg by mouth 2 (two) times daily. (Patient not taking: Reported on 03/25/2022)     No facility-administered medications prior to visit.     Review of Systems:   Constitutional: No night sweats, fevers, chills. +fatigue, lassitude (resolving), recent weight loss (8 lb d/t pna) HEENT: No headaches, difficulty swallowing, tooth/dental problems, or sore throat. No sneezing, itching, ear ache, nasal congestion, or post nasal drip CV:  No chest pain, orthopnea, PND, swelling in lower extremities, anasarca, dizziness, palpitations, syncope Resp: +minimal cough, improved. No shortness of breath with exertion or at rest. No excess mucus or change in color of mucus. No hemoptysis. No wheezing.  No chest wall deformity GI:  +early satiety. No heartburn, indigestion, abdominal pain, nausea, vomiting, diarrhea, change in bowel habits, bloody stools.  GU: No dysuria, change in color of urine, urgency or frequency.   Skin: No rash, lesions, ulcerations MSK:  No joint pain or swelling.   Neuro: No  dizziness or lightheadedness.  Psych: No depression or anxiety. Mood stable.     Physical Exam:  BP 122/82 (BP Location: Left Arm, Cuff Size: Normal)   Pulse 76   Temp 98.4 F (36.9 C)   Ht 5\' 6"  (1.676 m)   Wt 87 lb 6.4 oz (39.6 kg)   LMP 05/03/1987   SpO2 99%   BMI 14.11 kg/m   GEN: Pleasant, interactive, well-appearing; in no acute distress. HEENT:  Normocephalic and atraumatic. PERRLA. Sclera white. Nasal turbinates pink, moist and patent bilaterally. No rhinorrhea present. Oropharynx pink and moist, without exudate or edema. No lesions, ulcerations, or postnasal drip.  NECK:  Supple w/ fair ROM. No JVD present. Normal carotid impulses w/o bruits. Thyroid symmetrical with no goiter or nodules palpated. No lymphadenopathy.   CV: RRR, no m/r/g, no peripheral edema. Pulses intact, +2 bilaterally. No cyanosis, pallor or clubbing. PULMONARY:  Unlabored, regular  breathing. Clear bilaterally A&P w/o wheezes/rales/rhonchi. No accessory muscle use.  GI: BS present and normoactive. Soft, non-tender to palpation. No organomegaly or masses detected.  MSK: No erythema, warmth or tenderness. Cap refil <2 sec all extrem. No deformities or joint swelling noted.  Neuro: A/Ox3. No focal deficits noted.   Skin: Warm, no lesions or rashe Psych: Normal affect and behavior. Judgement and thought content appropriate.     Lab Results:  CBC    Component Value Date/Time   WBC 10.1 10/01/2021 1112   WBC 10.0 01/25/2021 1559   RBC 4.43 10/01/2021 1112   RBC 4.29 01/25/2021 1559   HGB 13.0 10/01/2021 1112   HCT 39.6 10/01/2021 1112   PLT 311 10/01/2021 1112   MCV 89 10/01/2021 1112   MCH 29.3 10/01/2021 1112   MCH 29.6 01/25/2021 1559   MCHC 32.8 10/01/2021 1112   MCHC 31.9 01/25/2021 1559   RDW 12.9 10/01/2021 1112   LYMPHSABS 2.5 01/25/2021 1559   MONOABS 0.8 01/25/2021 1559   EOSABS 0.1 01/25/2021 1559   BASOSABS 0.1 01/25/2021 1559    BMET    Component Value Date/Time   NA 137  10/01/2021 1112   K 4.2 10/01/2021 1112   CL 98 10/01/2021 1112   CO2 26 10/01/2021 1112   GLUCOSE 91 10/01/2021 1112   GLUCOSE 82 01/25/2021 1559   BUN 19 10/01/2021 1112   CREATININE 0.49 (L) 10/01/2021 1112   CALCIUM 9.9 10/01/2021 1112   GFRNONAA >60 01/25/2021 1559   GFRAA >60 10/15/2017 1944    BNP No results found for: "BNP"   Imaging:  No results found.        No data to display          No results found for: "NITRICOXIDE"      Assessment & Plan:   CAP (community acquired pneumonia) Currently completing 10 day course of cefdinir. CT imaging from 11/13 read did not mention infiltrates or consolidation. CXR images obtained sometime the week of 11/20 are not available to review today. Records requested. She is clinically improving. Still with some residual fatigue. Working on weight gain. Plan to repeat imaging in 6 weeks to ensure resolution.   Patient Instructions  Complete cefdinir as previously prescribed  Monitor how you're feeling once you come off of your antibiotics - if you develop increased productive cough, new fevers, shortness of breath, increased chest congestion, or feeling more run down, call for sooner follow up or go to the emergency department if symptoms are severe  Repeat chest x ray in 6 weeks to ensure pneumonia has resolved  Pulmonary function testing upon return  Follow up with Dr. Delton Coombes in 6 weeks after PFTs. Next CT chest will be scheduled at this visit, likely sometime in February. If symptoms do not improve or worsen, please contact office for sooner follow up or seek emergency care.    Lung nodule 10 mm spiculated RUL nodule from CT chest 11/13. Short term follow up with repeat CT in 3 months. Her recent weight loss and symptoms seem to be more so related to infectious process but will need to be monitored.   COPD (chronic obstructive pulmonary disease) (HCC) No formal PFTs. 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 She does not have  daily symptoms of dyspnea or cough; low symptom burden. Currently not on scheduled bronchodilators. We will order PFTs. Hold off on inhaler therapy for now.    I spent 35 minutes of dedicated to the care of this patient on the  date of this encounter to include pre-visit review of records, face-to-face time with the patient discussing conditions above, post visit ordering of testing, clinical documentation with the electronic health record, making appropriate referrals as documented, and communicating necessary findings to members of the patients care team.  Noemi Chapel, NP 03/25/2022  Pt aware and understands NP's role.

## 2022-04-05 ENCOUNTER — Ambulatory Visit (INDEPENDENT_AMBULATORY_CARE_PROVIDER_SITE_OTHER): Payer: Medicare HMO

## 2022-04-05 ENCOUNTER — Other Ambulatory Visit: Payer: Medicare HMO

## 2022-04-05 ENCOUNTER — Ambulatory Visit: Payer: Medicare HMO | Admitting: Nurse Practitioner

## 2022-04-05 ENCOUNTER — Encounter: Payer: Self-pay | Admitting: Nurse Practitioner

## 2022-04-05 ENCOUNTER — Telehealth: Payer: Self-pay | Admitting: Nurse Practitioner

## 2022-04-05 VITALS — BP 126/66 | HR 81 | Temp 98.6°F | Ht 66.0 in | Wt 87.2 lb

## 2022-04-05 DIAGNOSIS — J189 Pneumonia, unspecified organism: Secondary | ICD-10-CM

## 2022-04-05 DIAGNOSIS — J449 Chronic obstructive pulmonary disease, unspecified: Secondary | ICD-10-CM | POA: Diagnosis not present

## 2022-04-05 DIAGNOSIS — R053 Chronic cough: Secondary | ICD-10-CM | POA: Diagnosis not present

## 2022-04-05 DIAGNOSIS — J441 Chronic obstructive pulmonary disease with (acute) exacerbation: Secondary | ICD-10-CM

## 2022-04-05 DIAGNOSIS — I480 Paroxysmal atrial fibrillation: Secondary | ICD-10-CM

## 2022-04-05 MED ORDER — LEVOFLOXACIN 500 MG PO TABS: 500.0000 mg | ORAL_TABLET | Freq: Every day | ORAL | 0 refills | Status: AC

## 2022-04-05 MED ORDER — PREDNISONE 20 MG PO TABS: 20.0000 mg | ORAL_TABLET | Freq: Every day | ORAL | 0 refills | Status: AC

## 2022-04-05 MED ORDER — ALBUTEROL SULFATE HFA 108 (90 BASE) MCG/ACT IN AERS: 2.0000 | INHALATION_SPRAY | Freq: Four times a day (QID) | RESPIRATORY_TRACT | 2 refills | Status: DC | PRN

## 2022-04-05 NOTE — Telephone Encounter (Signed)
Spoke with the pt and notified of response per Florentina Addison  She verbalized understanding  OVC with Katie at 1:30 pm today  Nothing further needed

## 2022-04-05 NOTE — Assessment & Plan Note (Signed)
Rate controlled at visit. Anticoagulated with Eliquis. Follow up with cardiology as scheduled.

## 2022-04-05 NOTE — Progress Notes (Addendum)
 @Patient  ID: Kristina Dennis, female    DOB: 09/24/1945, 76 y.o.   MRN: [id number]  Chief Complaint  Patient presents with   Acute Visit    Referring provider: Marva PandaMillsaps, Kimberly, NP  HPI: 76 year old female, former smoker followed for chronic cough and emphysema. She is a patient of Dr. Kavin LeechByrum's and [facility]. Past medical history significant for atrial fib on Eliquis.   TEST/EVENTS:  10/15/2017 CT chest wo contrast: ectasia of the ascending thoracic aorta. Atherosclerosis. No LAD. Small less than 5% left apical and tiny basilar pneumothoraces. No tension. 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 Biapical pleuroparenchymal and subpleural opacities, likely atelectasis or scarring. Probable tiny granuloma in LLL 03/04/2022 CT chest wo contrast: emphysema without focal infiltrate. Spiculated 10 mm RUL nodule. 4 mm LUL nodule vs granuloma. Atherosclerosis.   03/25/2022: OV with Drake Wuertz NP for follow up after being treated by her PCP for pneumonia. Her illness all started with sore throat and palpitations 10/6 - went to the ED and was diagnosed with a fib. Converted to sinus rhythm on her own. She was started on anticoagulation therapy. She went back to her PCP sometime before Thanksgiving and had a CT scan that showed pneumonia. Treated with an antibiotic that she doesn't recall the name of. She was also treated with steroid injection and prednisone taper. Still felt bad; cough with green productive sputum. She also had pleuritic pain and fevers. She waited a few days then returned to her PCP when symptoms didn't improve, who obtain a chest x ray which showed multifocal pneumonia per her report. Image unavailable today. That was when she was started on 10 day course of cefdinir, which she remains on today. She has three additional days left. CT also showed a nodule, which she was told would need to be follow up on. Today, she tells me  that she is feeling much better. She actually got ready for the first time in a while today. Still feeling fatigued but slowly improving. Cough is minimal and only produces sputum in the morning, which she attributes to her sinus drainage and usually clear to white. No fevers, chills, hemoptysis. Breathing is good - never had any issues with it. She is not currently on any maintenance bronchodilators. She is currently working on putting some weight back on. She lost 8 lb with recent illness. Appetite is ok; she sometimes feels full without having ate much. She is drinking around 3 protein shakes a day.   04/05/2022: Today - acute Patient presents today for acute visit. She contacted the office with reports of increased, productive cough with large amounts of green sputum since completing abx. She was advised to come in for further evaluation. She tells me that she completed cefdinir course on 12/7. She noticed that over the weekend, she started coughing up a lot more mucus and by Tuesday, it was getting darker. She is feeling really fatigued/run down and her appetite is poor. She is unsure if she has had any fevers, because she's not sure how to work her thermometer at home, but she has had chills. She has more chest congestion and also feels like her nose is more congested. She denies any increased shortness of breath, wheezing, hemoptysis, night sweats. She did have one episode of diarrhea last night, which she attributed to something she ate. No nausea/vomiting. No interim sick exposures.   Allergies  Allergen Reactions   Hydrocodone Nausea And  Vomiting   Amlodipine     Severe all over joint pain   Azithromycin Nausea Only   Excedrin Extra Strength [Aspirin-Acetaminophen-Caffeine] Nausea Only    Extreme nausea   Niacin Nausea And Vomiting   Niacin And Related Nausea And Vomiting   Sulfa Antibiotics Nausea And Vomiting and Other (See Comments)    Violently ill   Sulfasalazine Nausea And Vomiting     Violently ill   Latex Rash    Immunization History  Administered Date(s) Administered   Influenza Inj Mdck Quad With Preservative 04/29/2016   Influenza, High Dose Seasonal PF 01/19/2018   Influenza-Unspecified 03/10/2017   Pneumococcal Polysaccharide-23 03/08/2017, 01/19/2018   Td 10/15/2017   Zoster Recombinat (Shingrix) 02/04/2018, 02/05/2018    Past Medical History:  Diagnosis Date   Arthritis    Colitis 2003   per path report from 2003: non-specific.  normal colonoscopy in 04/2011.    GERD (gastroesophageal reflux disease)    Hypertension    Irritable bowel syndrome    Osteoporosis    Palpitations    Pneumonia    hx   Rhinitis     Tobacco History: Social History   Tobacco Use  Smoking Status Former   Packs/day: 0.50   Years: 50.00   Total pack years: 25.00   Types: Cigarettes   Quit date: 2020   Years since quitting: 3.9  Smokeless Tobacco Never   Counseling given: Not Answered   Outpatient Medications Prior to Visit  Medication Sig Dispense Refill   Ascorbic Acid (VITAMIN C) 1000 MG tablet Take 1,000 mg by mouth.     Coenzyme Q10 (CO Q 10) 10 MG CAPS Take 1 capsule by mouth daily in the afternoon.     ELDERBERRY PO Take 1 capsule by mouth daily in the afternoon.     ELIQUIS 5 MG TABS tablet TAKE 1 TABLET BY MOUTH TWICE A DAY 60 tablet 5   Magnesium 200 MG TABS Take by mouth.     metoprolol succinate (TOPROL-XL) 25 MG 24 hr tablet Take 25 mg by mouth daily.     VITAMIN E PO Take 1 tablet by mouth 3 (three) times daily.     No facility-administered medications prior to visit.     Review of Systems:   Constitutional: No night sweats, fevers, chills. +fatigue, lassitude, recent weight loss (stable since recent OV) HEENT: No headaches, difficulty swallowing, tooth/dental problems, or sore throat. No sneezing, itching, ear ache. +nasal congestion, post nasal drip CV:  No chest pain, orthopnea, PND, swelling in lower extremities, anasarca, dizziness,  palpitations, syncope Resp: +productive cough with green sputum; chest congestion. No shortness of breath with exertion or at rest. No hemoptysis. No wheezing.  No chest wall deformity GI:  +decreased appetite; one episode of diarrhea. No heartburn, indigestion, abdominal pain, nausea, vomiting, change in bowel habits, bloody stools.  GU: No dysuria, change in color of urine, urgency or frequency.   Skin: No rash, lesions, ulcerations MSK:  No joint pain or swelling.   Neuro: No dizziness or lightheadedness.  Psych: No depression or anxiety. Mood stable.     Physical Exam:  BP 126/66 (BP Location: Right Arm, Patient Position: Sitting, Cuff Size: Normal)   Pulse 81   Temp 98.6 F (37 C) (Core)   Ht 5\' 6"  (1.676 m)   Wt 87 lb 3.2 oz (39.6 kg)   LMP 05/03/1987   SpO2 99%   BMI 14.07 kg/m   GEN: Pleasant, interactive, well-kempt; frail, elderly; in no acute distress.  HEENT:  Normocephalic and atraumatic. PERRLA. Sclera white. Nasal turbinates pink, moist and patent bilaterally. No rhinorrhea present. Oropharynx pink and moist, without exudate or edema. No lesions, ulcerations, or postnasal drip.  NECK:  Supple w/ fair ROM. No JVD present. Normal carotid impulses w/o bruits. Thyroid symmetrical with no goiter or nodules palpated. No lymphadenopathy.   CV: RRR, no m/r/g, no peripheral edema. Pulses intact, +2 bilaterally. No cyanosis, pallor or clubbing. PULMONARY:  Unlabored, regular breathing. Scattered expiratory wheeze bilaterally A&P. No accessory muscle use.  GI: BS present and normoactive. Soft, non-tender to palpation. No organomegaly or masses detected.  MSK: No erythema, warmth or tenderness. Cap refil <2 sec all extrem. No deformities or joint swelling noted.  Neuro: A/Ox3. No focal deficits noted.   Skin: Warm, no lesions or rashe Psych: Normal affect and behavior. Judgement and thought content appropriate.     Lab Results:  CBC    Component Value Date/Time   WBC 10.1  10/01/2021 1112   WBC 10.0 01/25/2021 1559   RBC 4.43 10/01/2021 1112   RBC 4.29 01/25/2021 1559   HGB 13.0 10/01/2021 1112   HCT 39.6 10/01/2021 1112   PLT 311 10/01/2021 1112   MCV 89 10/01/2021 1112   MCH 29.3 10/01/2021 1112   MCH 29.6 01/25/2021 1559   MCHC 32.8 10/01/2021 1112   MCHC 31.9 01/25/2021 1559   RDW 12.9 10/01/2021 1112   LYMPHSABS 2.5 01/25/2021 1559   MONOABS 0.8 01/25/2021 1559   EOSABS 0.1 01/25/2021 1559   BASOSABS 0.1 01/25/2021 1559    BMET    Component Value Date/Time   NA 137 10/01/2021 1112   K 4.2 10/01/2021 1112   CL 98 10/01/2021 1112   CO2 26 10/01/2021 1112   GLUCOSE 91 10/01/2021 1112   GLUCOSE 82 01/25/2021 1559   BUN 19 10/01/2021 1112   CREATININE 0.49 (L) 10/01/2021 1112   CALCIUM 9.9 10/01/2021 1112   GFRNONAA >60 01/25/2021 1559   GFRAA >60 10/15/2017 1944    BNP No results found for: "BNP"   Imaging:  DG Chest 2 View  Result Date: 04/05/2022 CLINICAL DATA:  Cough EXAM: CHEST - 2 VIEW COMPARISON:  01/25/2021 FINDINGS: Cardiac size is within normal limits. Increase in AP diameter of chest and low position of diaphragms suggests COPD. There are no signs of alveolar pulmonary edema. Subtle increase in interstitial markings in the upper lung fields may suggest scarring. Increased markings are seen in right lower lung field suggesting possible new focus of atelectasis/pneumonia. Pleural thickening is seen in both apices. IMPRESSION: COPD. There are no signs of pulmonary edema. There is new focal area of increased markings in right lower lung fields suggesting atelectasis/pneumonia. Electronically Signed   By: Ernie Avena M.D.   On: 04/05/2022 13:45          No data to display          No results found for: "NITRICOXIDE"      Assessment & Plan:   CAP (community acquired pneumonia) RLL pneumonia on imaging today; unclear if this is new or unresolved from recent illness as previous imaging is unavailable. I have  requested these records again today. Given her worsening infectious symptoms, we will move forward with abx course with levaquin x 7 days. Sputum culture ordered today.  Mucociliary clearance therapies included; provided with flutter valve. VS stable. Strict return/ED precautions.   Patient Instructions  Levaquin 1 tab daily for 7 days. Take with food. Notify and stop immediately if  you develop tendon pain in your heel or if you have 3-4 watery bowel movements. Prednisone 20 mg daily for 5 days Albuterol 2 puffs every 6 hours as needed for shortness of breath or wheezing.  Saline nasal irrigation 1-2 times a day Flonase nasal spray 2 sprays each nostril daily for nasal congestion  Continue guaifenesin [phone] mg Twice daily for congestion   Follow up with Dr. Delton Coombes or Florentina Addison Coulson Wehner,NP in one week. If symptoms do not improve or worsen, please contact office for sooner follow up or seek emergency care.    COPD (chronic obstructive pulmonary disease) (HCC) Possible AECOPD/emphysema secondary to pneumonia. Not on any maintenance therapy. She would prefer not to be on daily regimen right now. No significant respiratory symptoms at baseline. Treated with duoneb in office. Provided with albuterol for rescue. May need to consider addition of LAMA or LAMA/LABA in future. Still needs formal PFTs.   Atrial fibrillation (HCC) Rate controlled at visit. Anticoagulated with Eliquis. Follow up with cardiology as scheduled.     I spent 45 minutes of dedicated to the care of this patient on the date of this encounter to include pre-visit review of records, face-to-face time with the patient discussing conditions above, post visit ordering of testing, clinical documentation with the electronic health record, making appropriate referrals as documented, and communicating necessary findings to members of the patients care team.  Noemi Chapel, NP 04/05/2022  Pt aware and understands NP's role.

## 2022-04-05 NOTE — Patient Instructions (Addendum)
Levaquin 1 tab daily for 7 days. Take with food. Notify and stop immediately if you develop tendon pain in your heel or if you have 3-4 watery bowel movements. Prednisone 20 mg daily for 5 days Albuterol 2 puffs every 6 hours as needed for shortness of breath or wheezing.  Saline nasal irrigation 1-2 times a day Flonase nasal spray 2 sprays each nostril daily for nasal congestion  Continue guaifenesin [phone] mg Twice daily for congestion   Follow up with Dr. Delton Coombes or Florentina Addison Dewey Neukam,NP in one week. If symptoms do not improve or worsen, please contact office for sooner follow up or seek emergency care.

## 2022-04-05 NOTE — Telephone Encounter (Signed)
Spoke with the pt  She states continues to have prod cough with large amounts of green sputum ever since recent ov 03/25/22 She finished cefdinir 03/28/22  She has not been having any fevers, increased SOB, wheezing, chest tightness  She does have chills, sweats and feels fatigued  She is asking for recs  No openings today with Katie or RB  She has ov with PFT set for 05/06/22   Florentina Addison, please advise, thanks!

## 2022-04-05 NOTE — Assessment & Plan Note (Addendum)
RLL pneumonia on imaging today; unclear if this is new or unresolved from recent illness as previous imaging is unavailable. I have requested these records again today. Given her worsening infectious symptoms, we will move forward with abx course with levaquin x 7 days. Sputum culture ordered today.  Mucociliary clearance therapies included; provided with flutter valve. VS stable. Strict return/ED precautions.   Patient Instructions  Levaquin 1 tab daily for 7 days. Take with food. Notify and stop immediately if you develop tendon pain in your heel or if you have 3-4 watery bowel movements. Prednisone 20 mg daily for 5 days Albuterol 2 puffs every 6 hours as needed for shortness of breath or wheezing.  Saline nasal irrigation 1-2 times a day Flonase nasal spray 2 sprays each nostril daily for nasal congestion  Continue guaifenesin [phone] Twice daily for congestion   Follow up with Dr. Delton Coombes or Florentina Addison Taray Normoyle,NP in one week. If symptoms do not improve or worsen, please contact office for sooner follow up or seek emergency care.

## 2022-04-05 NOTE — Telephone Encounter (Signed)
PT states worse. Lots of green phlegm. Wonders if more antibx needed or phlegm sample needed to check.No fever. Please call @ [phone]

## 2022-04-05 NOTE — Telephone Encounter (Signed)
Her cough had improved with only AM production and white sputum when I saw her in office. Did the green phlegm return after she completed the cefdinir course? She really needs to be evaluated with CXR and sputum culture. You can double book me later today, if she can come in. Thanks.

## 2022-04-05 NOTE — Assessment & Plan Note (Signed)
Possible AECOPD/emphysema secondary to pneumonia. Not on any maintenance therapy. She would prefer not to be on daily regimen right now. No significant respiratory symptoms at baseline. Treated with duoneb in office. Provided with albuterol for rescue. May need to consider addition of LAMA or LAMA/LABA in future. Still needs formal PFTs.

## 2022-04-08 ENCOUNTER — Ambulatory Visit: Payer: Medicare HMO | Admitting: Nurse Practitioner

## 2022-04-13 LAB — RESPIRATORY CULTURE OR RESPIRATORY AND SPUTUM CULTURE
MICRO NUMBER:: 14321608
RESULT:: NORMAL
SPECIMEN QUALITY:: ADEQUATE

## 2022-04-18 NOTE — Progress Notes (Signed)
Sputum culture normal. Thanks.

## 2022-04-20 ENCOUNTER — Other Ambulatory Visit: Payer: Self-pay | Admitting: Cardiovascular Disease

## 2022-04-20 DIAGNOSIS — I480 Paroxysmal atrial fibrillation: Secondary | ICD-10-CM

## 2022-04-26 ENCOUNTER — Ambulatory Visit: Payer: Medicare HMO | Admitting: Nurse Practitioner

## 2022-04-29 NOTE — Progress Notes (Unsigned)
Cardiology Office Note:    Date:  04/30/2022   ID:  Kristina Dennis, DOB 1946-01-24, MRN [id number]  PCP:  Marva Panda, NP   [facility] Providers Cardiologist:  Kristeen Miss, MD     Referring MD: Marva Panda, NP   Chief Complaint: follow-up paroxysmal atrial fibrillation  History of Present Illness:    Kristina Dennis is a very pleasant 77 y.o. female with a hx of HTN, coronary artery calcifications, PAF, former tobacco abuse, COPD, and anxiety.   Seen initially by Dr. Elease Hashimoto in 2012, she reestablished care Feb 2020. She was noted to have coronary artery calcifications on chest CT June 2019. At that time, she was taking amlodipine on a PRN basis, about once every week or 2.   Presented to ED on 01/24/21 with sore throat, was walking out of the ED and felt sudden onset of palpiations. Found to be in AF with RVR which resolved spontaneously.She was started on Eliquis 5 mg BID for OAC with CHA2DS2-VASc score of 4.   Last cardiology clinic visit was 01/28/22 with Dr. Elease Hashimoto at which time she reported frustration with diagnosis of AF, "waiting every day to see if she is going to die or have a stroke." She continued to exercise on a regular basis. Maintaining sinus rhythm on metoprolol. Was encouraged to f/u with PCP regarding anxiety.   Today, she is here for 3 month follow-up, she is here alone today. Had pneumonia from October 9 until end of December - it took 3 antibiotics to overcome it. Celebrated a birthday [birth year], was feeling well at that time. No frequent symptoms of a fib, on one occasion, noted palpitations for approximately 10 minutes. Home BP and HR very well-controlled. Reports one episode of "angina" described as a feeling of grabbing or squeezing pain. Only once, no reoccurrence. Reports she was told in the past that it was angina and she took ntg for a while. Symptoms previously occurred more frequently when she worked in a very stressful environment. Symptom  associated with pneumonia was pain in her shins, like shin splints. She does get short of breath now with vacuuming. No orthopnea, PND, edema, presyncope, and syncope.   Past Medical History:  Diagnosis Date   Arthritis    Colitis 2003   per path report from 2003: non-specific.  normal colonoscopy in 04/2011.    GERD (gastroesophageal reflux disease)    Hypertension    Irritable bowel syndrome    Osteoporosis    Palpitations    Pneumonia    hx   Rhinitis     Past Surgical History:  Procedure Laterality Date   BUNIONECTOMY Left    COLONOSCOPY     DILATION AND CURETTAGE OF UTERUS     ESOPHAGOGASTRODUODENOSCOPY  2021   KNEE SURGERY Right    arthroscopy   ORIF WRIST FRACTURE Left 12/13/2013   Procedure: OPEN REDUCTION INTERNAL FIXATION (ORIF) WRIST FRACTURE;  Surgeon: Cammy Copa, MD;  Location: [facility] OR;  Service: Orthopedics;  Laterality: Left;   TONSILLECTOMY     TUBAL LIGATION      Current Medications: Current Meds  Medication Sig   albuterol (VENTOLIN HFA) 108 (90 Base) MCG/ACT inhaler Inhale 2 puffs into the lungs every 6 (six) hours as needed for wheezing or shortness of breath.   Ascorbic Acid (VITAMIN C) 1000 MG tablet Take 1,000 mg by mouth.   Coenzyme Q10 (CO Q 10) 10 MG CAPS Take 1 capsule by mouth daily in the afternoon.  ELDERBERRY PO Take 1 capsule by mouth daily in the afternoon.   ELIQUIS 5 MG TABS tablet TAKE 1 TABLET BY MOUTH TWICE A DAY   Magnesium 200 MG TABS Take by mouth.   VITAMIN E PO Take 1 tablet by mouth 3 (three) times daily.   [DISCONTINUED] metoprolol succinate (TOPROL-XL) 25 MG 24 hr tablet Take 25 mg by mouth daily.     Allergies:   Hydrocodone, Amlodipine, Azithromycin, Excedrin extra strength [aspirin-acetaminophen-caffeine], Niacin, Niacin and related, Sulfa antibiotics, Sulfasalazine, and Latex   Social History   Socioeconomic History   Marital status: Married    Spouse name: Not on file   Number of children: 2   Years of  education: Not on file   Highest education level: Not on file  Occupational History   Occupation: Retired  Tobacco Use   Smoking status: Former    Packs/day: 0.50    Years: 50.00    Total pack years: 25.00    Types: Cigarettes    Quit date: 2020    Years since quitting: 4.0   Smokeless tobacco: Never  Vaping Use   Vaping Use: Never used  Substance and Sexual Activity   Alcohol use: No    Alcohol/week: 0.0 standard drinks of alcohol   Drug use: No   Sexual activity: Not on file  Other Topics Concern   Not on file  Social History Narrative   Married to [place]-   2 children   Retired   Former smoker no alcohol tobacco or drug use currently   Social Determinants of Corporate investment banker Strain: Not on Ship broker Insecurity: Not on file  Transportation Needs: Not on file  Physical Activity: Not on file  Stress: Not on file  Social Connections: Not on file     Family History: The patient's family history includes Arrhythmia in her mother; Cancer in her sister; Colon cancer (age of onset: 6) in her sister; Hypertension in her father; Rectal cancer (age of onset: 74) in her sister. There is no history of Pancreatic cancer, Esophageal cancer, or Stomach cancer.  ROS:   Please see the history of present illness.    + DOE All other systems reviewed and are negative.  Labs/Other Studies Reviewed:    The following studies were reviewed today:  Lexiscan Myoview 02/06/21   Lexiscan stress is electrcially negative for ischemai   LV perfusion is normal with minimal thinning of the inferior apex.  PResent in both rest and stress images There is no evidence of ischemia. There is no evidence of infarction.  COnsistent with probable soft tissue attenuation (mild)   Left ventricular function is normal. Nuclear stress EF: 76 %.   No change from 2020   Lexiscan stress is electrcially negative for ischemia Myoview scan with normal minimall inferior thinning at apex consistent  with probable soft tissue attenuation, no ischemia or scar.   LVEF 76%  Recent Labs: 10/01/2021: BUN 19; Creatinine, Ser 0.49; Hemoglobin 13.0; Platelets 311; Potassium 4.2; Sodium 137; TSH 1.110  Recent Lipid Panel No results found for: "CHOL", "TRIG", "HDL", "CHOLHDL", "VLDL", "LDLCALC", "LDLDIRECT"   Risk Assessment/Calculations:    CHA2DS2-VASc Score = 4   This indicates a 4.8% annual risk of stroke. The patient's score is based upon: CHF History: 0 HTN History: 1 Diabetes History: 0 Stroke History: 0 Vascular Disease History: 0 Age Score: 2 Gender Score: 1    Physical Exam:    VS:  BP 104/78   Pulse 84  Ht 5\' 6"  (1.676 m)   Wt 86 lb (39 kg)   LMP 05/03/1987   SpO2 97%   BMI 13.88 kg/m     Wt Readings from Last 3 Encounters:  04/30/22 86 lb (39 kg)  04/05/22 87 lb 3.2 oz (39.6 kg)  03/25/22 87 lb 6.4 oz (39.6 kg)     GEN:  Thin, frail in no acute distress HEENT: Normal NECK: No JVD; No carotid bruits CARDIAC: RRR, no murmurs, rubs, gallops RESPIRATORY:  Clear to auscultation without rales, wheezing or rhonchi  ABDOMEN: Soft, non-tender, non-distended MUSCULOSKELETAL:  No edema; No deformity. 2+  pedal pulses, equal bilaterally SKIN: Warm and dry NEUROLOGIC:  Alert and oriented x 3 PSYCHIATRIC:  Normal affect   EKG:  EKG is not ordered today.   Diagnoses:    1. Coronary artery disease involving native coronary artery of native heart without angina pectoris   2. Paroxysmal atrial fibrillation (HCC)   3. Anticoagulant long-term use   4. Essential hypertension   5. DOE (dyspnea on exertion)    Assessment and Plan:     PAF on chronic anticoagulation: Clinically appears to be maintaining sinus rhythm. HR is well controlled. Occasional palpitations, no sustained episodes of tachycardia. No significant symptoms. Feels that she rarely has episodes of a fib. No bleeding problems on Eliquis, continue 5 mg twice daily which is appropriate dose. Continue  metoprolol.   DOE: Increased dyspnea since having pneumonia. No orthopnea, edema, or PND. Appears euvolemic on exam.  We discussed testing but she feels that her symptoms are gradually improving. Advised her to notify us if symptoms worsen prior to next office visit.   Hypertension: BP is well controlled.   Coronary artery calcifications on CT: Moderate atherosclerotic disease on CT 2016. Normal LV function, no ischemia on NST 02/06/21. One recent episode of chest pain. Discussed coronary CTA for mapping of coronary anatomy but she would to hold off for now. Will notify us if symptoms worsen. As noted below, need to ensure LDL goal < 70.   Hyperlipidemia LDL goal < 70: LDL 98 02/2019. Need to update lipid panel. Will have her return for fasting labs.      Disposition: 6 months with me  Medication Adjustments/Labs and Tests Ordered: Current medicines are reviewed at length with the patient today.  Concerns regarding medicines are outlined above.  No orders of the defined types were placed in this encounter.  Meds ordered this encounter  Medications   metoprolol succinate (TOPROL-XL) 25 MG 24 hr tablet    Sig: Take 1 tablet (25 mg total) by mouth daily.    Dispense:  90 tablet    Refill:  3    Patient Instructions  Medication Instructions:   Your physician recommends that you continue on your current medications as directed. Please refer to the Current Medication list given to you today.   *If you need a refill on your cardiac medications before your next appointment, please call your pharmacy*   Lab Work:  None ordered.  If you have labs (blood work) drawn today and your tests are completely normal, you will receive your results only by: [facility] (if you have MyChart) OR A paper copy in the mail If you have any lab test that is abnormal or we need to change your treatment, we will call you to review the results.   Testing/Procedures:  None  ordered.   Follow-Up: At [facility], you and your health needs are our priority.  As part of our continuing mission to provide you with exceptional heart care, we have created designated Provider Care Teams.  These Care Teams include your primary Cardiologist (physician) and Advanced Practice Providers (APPs -  Physician Assistants and Nurse Practitioners) who all work together to provide you with the care you need, when you need it.  We recommend signing up for the patient portal called "MyChart".  Sign up information is provided on this After Visit Summary.  MyChart is used to connect with patients for Virtual Visits (Telemedicine).  Patients are able to view lab/test results, encounter notes, upcoming appointments, etc.  Non-urgent messages can be sent to your provider as well.   To learn more about what you can do with MyChart, go to [email or web address].    Your next appointment:   5 month(s)  The format for your next appointment:   In Person  Provider:   Eligha Bridegroom, NP         Important Information About Sugar         Signed, Levi Aland, NP  04/30/2022 4:00 PM    [facility]

## 2022-04-30 ENCOUNTER — Ambulatory Visit: Payer: Medicare HMO | Attending: Nurse Practitioner | Admitting: Nurse Practitioner

## 2022-04-30 ENCOUNTER — Encounter: Payer: Self-pay | Admitting: Nurse Practitioner

## 2022-04-30 VITALS — BP 104/78 | HR 84 | Ht 66.0 in | Wt 86.0 lb

## 2022-04-30 DIAGNOSIS — I10 Essential (primary) hypertension: Secondary | ICD-10-CM

## 2022-04-30 DIAGNOSIS — I480 Paroxysmal atrial fibrillation: Secondary | ICD-10-CM

## 2022-04-30 DIAGNOSIS — I2510 Atherosclerotic heart disease of native coronary artery without angina pectoris: Secondary | ICD-10-CM

## 2022-04-30 DIAGNOSIS — Z7901 Long term (current) use of anticoagulants: Secondary | ICD-10-CM | POA: Diagnosis not present

## 2022-04-30 DIAGNOSIS — R0609 Other forms of dyspnea: Secondary | ICD-10-CM

## 2022-04-30 MED ORDER — METOPROLOL SUCCINATE ER 25 MG PO TB24: 25.0000 mg | ORAL_TABLET | Freq: Every day | ORAL | 3 refills | Status: DC

## 2022-04-30 NOTE — Patient Instructions (Signed)
Medication Instructions:   Your physician recommends that you continue on your current medications as directed. Please refer to the Current Medication list given to you today.   *If you need a refill on your cardiac medications before your next appointment, please call your pharmacy*   Lab Work:  None ordered.  If you have labs (blood work) drawn today and your tests are completely normal, you will receive your results only by: [facility] (if you have MyChart) OR A paper copy in the mail If you have any lab test that is abnormal or we need to change your treatment, we will call you to review the results.   Testing/Procedures:  None ordered.   Follow-Up: At [facility], you and your health needs are our priority.  As part of our continuing mission to provide you with exceptional heart care, we have created designated Provider Care Teams.  These Care Teams include your primary Cardiologist (physician) and Advanced Practice Providers (APPs -  Physician Assistants and Nurse Practitioners) who all work together to provide you with the care you need, when you need it.  We recommend signing up for the patient portal called "MyChart".  Sign up information is provided on this After Visit Summary.  MyChart is used to connect with patients for Virtual Visits (Telemedicine).  Patients are able to view lab/test results, encounter notes, upcoming appointments, etc.  Non-urgent messages can be sent to your provider as well.   To learn more about what you can do with MyChart, go to [email or web address].    Your next appointment:   5 month(s)  The format for your next appointment:   In Person  Provider:   Christen Bame, NP         Important Information About Sugar

## 2022-05-03 ENCOUNTER — Telehealth: Payer: Self-pay | Admitting: *Deleted

## 2022-05-03 DIAGNOSIS — Z79899 Other long term (current) drug therapy: Secondary | ICD-10-CM

## 2022-05-03 NOTE — Telephone Encounter (Signed)
-----  Message from Emmaline Life, NP sent at 04/30/2022  4:03 PM EST ----- Please call and have her come in for fasting lipid and cmet.  Important to monitor cholesterol since she has a history of coronary artery calcification on CT.   Thanks! Sharyn Lull

## 2022-05-03 NOTE — Telephone Encounter (Signed)
Spoke with pt, she will come in on Monday, 05/06/22 for lipid / cmet.

## 2022-05-06 ENCOUNTER — Ambulatory Visit: Payer: Medicare HMO | Admitting: Nurse Practitioner

## 2022-05-06 ENCOUNTER — Encounter: Payer: Medicare HMO | Admitting: Nurse Practitioner

## 2022-05-06 ENCOUNTER — Ambulatory Visit: Payer: Medicare HMO | Attending: Nurse Practitioner

## 2022-05-06 DIAGNOSIS — Z79899 Other long term (current) drug therapy: Secondary | ICD-10-CM

## 2022-05-06 LAB — COMPREHENSIVE METABOLIC PANEL
ALT: 16 IU/L (ref 0–32)
AST: 23 IU/L (ref 0–40)
Albumin/Globulin Ratio: 1.8 (ref 1.2–2.2)
Albumin: 4.6 g/dL (ref 3.8–4.8)
Alkaline Phosphatase: 64 IU/L (ref 44–121)
BUN/Creatinine Ratio: 35 — ABNORMAL HIGH (ref 12–28)
BUN: 19 mg/dL (ref 8–27)
Bilirubin Total: 0.5 mg/dL (ref 0.0–1.2)
CO2: 25 mmol/L (ref 20–29)
Calcium: 9.8 mg/dL (ref 8.7–10.3)
Chloride: 101 mmol/L (ref 96–106)
Creatinine, Ser: 0.54 mg/dL — ABNORMAL LOW (ref 0.57–1.00)
Globulin, Total: 2.6 g/dL (ref 1.5–4.5)
Glucose: 90 mg/dL (ref 70–99)
Potassium: 4.4 mmol/L (ref 3.5–5.2)
Sodium: 141 mmol/L (ref 134–144)
Total Protein: 7.2 g/dL (ref 6.0–8.5)
eGFR: 95 mL/min/{1.73_m2} (ref >59)

## 2022-05-06 LAB — LIPID PANEL
Chol/HDL Ratio: 5.1 ratio — ABNORMAL HIGH (ref 0.0–4.4)
Cholesterol, Total: 315 mg/dL — ABNORMAL HIGH (ref 100–199)
HDL: 62 mg/dL (ref >39)
LDL Chol Calc (NIH): 241 mg/dL — ABNORMAL HIGH (ref 0–99)
Triglycerides: 76 mg/dL (ref 0–149)
VLDL Cholesterol Cal: 12 mg/dL (ref 5–40)

## 2022-05-07 ENCOUNTER — Telehealth: Payer: Self-pay | Admitting: *Deleted

## 2022-05-07 DIAGNOSIS — I2510 Atherosclerotic heart disease of native coronary artery without angina pectoris: Secondary | ICD-10-CM

## 2022-05-07 NOTE — Telephone Encounter (Signed)
See result note.  

## 2022-05-09 ENCOUNTER — Ambulatory Visit: Payer: Medicare HMO | Attending: Cardiology | Admitting: Pharmacist

## 2022-05-09 DIAGNOSIS — E785 Hyperlipidemia, unspecified: Secondary | ICD-10-CM | POA: Diagnosis not present

## 2022-05-09 NOTE — Progress Notes (Signed)
Patient ID: Kristina Dennis                 DOB: 05/11/1945                    MRN: [id number]      HPI: Kristina Dennis is a 77 y.o. female patient of Dr. Elmarie Shiley referred to lipid clinic by Christen Bame, NP. PMH is significant for  HTN, coronary artery calcifications, PAF, former tobacco abuse, COPD, and anxiety.  She was seen by Sharyn Lull on 04/30/2022.  Repeat cholesterol panel was obtained which showed an LDL-C of 241.  Previously LDL-C was 98 in 2020.  She requested to speak with the pharmacist before starting any medication.  Patient presents to lipid clinic today.  She reports that she stopped her statin about a year ago when she started Eliquis.  She had been on statin for 40 years, and did not feel as though she wanted to be on a statin at the same time.  After coming off her rosuvastatin she noticed that all her joint pains have gone away.  She is really not interested in any cholesterol medication.  She is absolutely not interested in any injectables.   Current Medications: None Intolerances: Rosuvastatin 20 mg Risk Factors: HTN LDL-C goal: <70 ApoB goal: <80  Diet: Patient is attempting to gain weight  Family History: The patient's family history includes Arrhythmia in her mother; Cancer in her sister; Colon cancer (age of onset: 40) in her sister; Hypertension in her father; Rectal cancer (age of onset: 63) in her sister. There is no history of Pancreatic cancer, Esophageal cancer, or Stomach cancer.   Social History:   Labs: Lipid Panel     Component Value Date/Time   CHOL 315 (H) 05/06/2022 0740   TRIG 76 05/06/2022 0740   HDL 62 05/06/2022 0740   CHOLHDL 5.1 (H) 05/06/2022 0740   LDLCALC 241 (H) 05/06/2022 0740   LABVLDL 12 05/06/2022 0740    Past Medical History:  Diagnosis Date   Arthritis    Colitis 2003   per path report from 2003: non-specific.  normal colonoscopy in 04/2011.    GERD (gastroesophageal reflux disease)    Hypertension    Irritable  bowel syndrome    Osteoporosis    Palpitations    Pneumonia    hx   Rhinitis     Current Outpatient Medications on File Prior to Visit  Medication Sig Dispense Refill   albuterol (VENTOLIN HFA) 108 (90 Base) MCG/ACT inhaler Inhale 2 puffs into the lungs every 6 (six) hours as needed for wheezing or shortness of breath. 8 g 2   Ascorbic Acid (VITAMIN C) 1000 MG tablet Take 1,000 mg by mouth.     Coenzyme Q10 (CO Q 10) 10 MG CAPS Take 1 capsule by mouth daily in the afternoon.     ELDERBERRY PO Take 1 capsule by mouth daily in the afternoon.     ELIQUIS 5 MG TABS tablet TAKE 1 TABLET BY MOUTH TWICE A DAY 60 tablet 5   Magnesium 200 MG TABS Take by mouth.     metoprolol succinate (TOPROL-XL) 25 MG 24 hr tablet Take 1 tablet (25 mg total) by mouth daily. 90 tablet 3   VITAMIN E PO Take 1 tablet by mouth 3 (three) times daily.     No current facility-administered medications on file prior to visit.    Allergies  Allergen Reactions   Hydrocodone Nausea And Vomiting  Amlodipine     Severe all over joint pain   Azithromycin Nausea Only   Excedrin Extra Strength [Aspirin-Acetaminophen-Caffeine] Nausea Only    Extreme nausea   Niacin Nausea And Vomiting   Niacin And Related Nausea And Vomiting   Sulfa Antibiotics Nausea And Vomiting and Other (See Comments)    Violently ill   Sulfasalazine Nausea And Vomiting    Violently ill   Latex Rash    Assessment/Plan:  1. Hyperlipidemia -  Hyperlipidemia Assessment: Patient is LDL-C is above 200 She was on statin therapy for 40 years, when she came off her joint pains have gone away Patient not convinced of the benefit of cholesterol treatment We do long discussion about what mendelian randomization tells [place] about the role of LDL-C and ASCVD and other risk factors for ASCVD Advised patient that I am only here to give her the information, that the choice is always up to her and what she feels as though would best benefit her We talked  about time to exposure and the benefit she must have gained over the last 40 years of taking statin therapy  Plan: After long conversation patient has no desire to start any therapy for her cholesterol I respect patient's decision and she will reach out if she changes her mind    Thank you,  Ramond Dial, Pharm.D, BCPS, CPP [facility] A Division of [facility] [address], [place], [place] [address]  Phone: [phone]; Fax: [phone]

## 2022-05-09 NOTE — Assessment & Plan Note (Signed)
Assessment: Patient is LDL-C is above 200 She was on statin therapy for 40 years, when she came off her joint pains have gone away Patient not convinced of the benefit of cholesterol treatment We do long discussion about what mendelian randomization tells [place] about the role of LDL-C and ASCVD and other risk factors for ASCVD Advised patient that I am only here to give her the information, that the choice is always up to her and what she feels as though would best benefit her We talked about time to exposure and the benefit she must have gained over the last 40 years of taking statin therapy  Plan: After long conversation patient has no desire to start any therapy for her cholesterol I respect patient's decision and she will reach out if she changes her mind

## 2022-05-17 ENCOUNTER — Other Ambulatory Visit: Payer: Self-pay

## 2022-05-17 DIAGNOSIS — J449 Chronic obstructive pulmonary disease, unspecified: Secondary | ICD-10-CM

## 2022-05-20 ENCOUNTER — Ambulatory Visit: Payer: Medicare HMO | Admitting: Primary Care

## 2022-05-20 ENCOUNTER — Ambulatory Visit (INDEPENDENT_AMBULATORY_CARE_PROVIDER_SITE_OTHER): Payer: Medicare HMO

## 2022-05-20 ENCOUNTER — Encounter: Payer: Self-pay | Admitting: Primary Care

## 2022-05-20 ENCOUNTER — Ambulatory Visit (INDEPENDENT_AMBULATORY_CARE_PROVIDER_SITE_OTHER): Payer: Medicare HMO | Admitting: Emergency Medicine

## 2022-05-20 VITALS — BP 96/56 | HR 70 | Temp 98.3°F | Ht 66.0 in | Wt 88.0 lb

## 2022-05-20 DIAGNOSIS — J449 Chronic obstructive pulmonary disease, unspecified: Secondary | ICD-10-CM | POA: Diagnosis not present

## 2022-05-20 DIAGNOSIS — J189 Pneumonia, unspecified organism: Secondary | ICD-10-CM

## 2022-05-20 LAB — PULMONARY FUNCTION TEST
DL/VA % pred: 55 %
DL/VA: 2.23 ml/min/mmHg/L
DLCO cor % pred: 44 %
DLCO cor: 9.1 ml/min/mmHg
DLCO unc % pred: 44 %
DLCO unc: 9.1 ml/min/mmHg
FEF 25-75 Post: 1.55 L/sec
FEF 25-75 Pre: 1.31 L/sec
FEF2575-%Change-Post: 18 %
FEF2575-%Pred-Post: 92 %
FEF2575-%Pred-Pre: 78 %
FEV1-%Change-Post: 4 %
FEV1-%Pred-Post: 82 %
FEV1-%Pred-Pre: 78 %
FEV1-Post: 1.84 L
FEV1-Pre: 1.76 L
FEV1FVC-%Change-Post: 3 %
FEV1FVC-%Pred-Pre: 99 %
FEV6-%Change-Post: 1 %
FEV6-%Pred-Post: 84 %
FEV6-%Pred-Pre: 83 %
FEV6-Post: 2.39 L
FEV6-Pre: 2.36 L
FEV6FVC-%Pred-Post: 105 %
FEV6FVC-%Pred-Pre: 105 %
FVC-%Change-Post: 1 %
FVC-%Pred-Post: 80 %
FVC-%Pred-Pre: 79 %
FVC-Post: 2.39 L
FVC-Pre: 2.36 L
Post FEV1/FVC ratio: 77 %
Post FEV6/FVC ratio: 100 %
Pre FEV1/FVC ratio: 74 %
Pre FEV6/FVC Ratio: 100 %
RV % pred: 128 %
RV: 3.11 L
TLC % pred: 99 %
TLC: 5.35 L

## 2022-05-20 NOTE — Assessment & Plan Note (Signed)
-  Symptomatically resolved, getting repeat CXR today

## 2022-05-20 NOTE — Assessment & Plan Note (Addendum)
-  Pulmonary function testing today showed no evidence of obstructive lung disease/ FEV1 82% predicted. Diffusion capacity was reduced but normal when adjusted for lung volumes. 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 She is not symptomatic. Denies shortness of breath, chest tightness, wheezing or cough. No changes to current treatment plan, continue to observe. FU in 6 months with Dr. Lamonte Sakai or sooner if she develops shortness of breath or wheezing.

## 2022-05-20 NOTE — Patient Instructions (Addendum)
Pulmonary function testing did not showed evidence of obstructive lung disease, diffusion capacity was reduce but normal correlated for lung volumes. Recommend repeating CXR today to ensure pneumonia has resolved on imaging.   Follow-up: 6 months with Dr. Lamonte Sakai If you develop shortness of breath or cough please call office sooner

## 2022-05-20 NOTE — Patient Instructions (Signed)
Full PFT Performed Today  

## 2022-05-20 NOTE — Progress Notes (Signed)
@Patient  ID: Kristina Dennis, female    DOB: 12-Apr-1946, 77 y.o.   MRN: [id number]  Chief Complaint  Patient presents with   Follow-up    Review PFT.  Doing well today.    Referring provider: Everardo Beals, NP  HPI: 77 year old female, former smoker quit 2020.  Past medical history significant for COPD, community-acquired pneumonia.  05/20/2022 Patient presents today for 1 month follow-up with PFTs. She was dx with right lower lobe PNA in December, treated with 7 day course of Levaquin. She is feeling better. Breathing wise she is doing well. No residual respiratory symptoms. Energy level took some time to come back. She had CT chest in 2019 that showed Biapical pleuroparenchymal and subpleural opacities felt to reflect areas of atelectasis and/or scarring. 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 Denies shortness of breath, wheezing, cough or chest tightness. Needs repeat CXR.    Allergies  Allergen Reactions   Hydrocodone Nausea And Vomiting   Amlodipine     Severe all over joint pain   Azithromycin Nausea Only   Excedrin Extra Strength [Aspirin-Acetaminophen-Caffeine] Nausea Only    Extreme nausea   Niacin Nausea And Vomiting   Niacin And Related Nausea And Vomiting   Sulfa Antibiotics Nausea And Vomiting and Other (See Comments)    Violently ill   Sulfasalazine Nausea And Vomiting    Violently ill   Latex Rash    Immunization History  Administered Date(s) Administered   Influenza Inj Mdck Quad With Preservative 04/29/2016   Influenza, High Dose Seasonal PF 01/19/2018   Influenza-Unspecified 03/10/2017   Pneumococcal Polysaccharide-23 03/08/2017, 01/19/2018   Td 10/15/2017   Zoster Recombinat (Shingrix) 02/04/2018, 02/05/2018    Past Medical History:  Diagnosis Date   Arthritis    Colitis 2003   per path report from 2003: non-specific.  normal colonoscopy in 04/2011.    GERD (gastroesophageal reflux disease)    Hypertension     Irritable bowel syndrome    Osteoporosis    Palpitations    Pneumonia    hx   Rhinitis     Tobacco History: Social History   Tobacco Use  Smoking Status Former   Packs/day: 0.25   Years: 50.00   Total pack years: 12.50   Types: Cigarettes   Quit date: 2020   Years since quitting: 4.0  Smokeless Tobacco Never  Tobacco Comments   Patient smokes 5 cigarettes a day   Counseling given: Not Answered Tobacco comments: Patient smokes 5 cigarettes a day   Outpatient Medications Prior to Visit  Medication Sig Dispense Refill   albuterol (VENTOLIN HFA) 108 (90 Base) MCG/ACT inhaler Inhale 2 puffs into the lungs every 6 (six) hours as needed for wheezing or shortness of breath. 8 g 2   Ascorbic Acid (VITAMIN C) 1000 MG tablet Take 1,000 mg by mouth.     Coenzyme Q10 (CO Q 10) 10 MG CAPS Take 1 capsule by mouth daily in the afternoon.     ELDERBERRY PO Take 1 capsule by mouth daily in the afternoon.     ELIQUIS 5 MG TABS tablet TAKE 1 TABLET BY MOUTH TWICE A DAY 60 tablet 5   Magnesium 200 MG TABS Take by mouth.     metoprolol succinate (TOPROL-XL) 25 MG 24 hr tablet Take 1 tablet (25 mg total) by mouth daily. 90 tablet 3   VITAMIN E PO Take 1 tablet by mouth 3 (three) times daily.     No facility-administered medications prior to visit.  Review of Systems  Review of Systems  Constitutional: Negative.   HENT: Negative.    Respiratory:  Negative for cough, shortness of breath and wheezing.     Physical Exam  BP (!) 96/56 (BP Location: Left Arm, Patient Position: Sitting, Cuff Size: Normal)   Pulse 70   Temp 98.3 F (36.8 C) (Oral)   Ht 5\' 6"  (1.676 m)   Wt 88 lb (39.9 kg)   LMP 05/03/1987   SpO2 98%   BMI 14.20 kg/m  Physical Exam Constitutional:      General: She is not in acute distress.    Appearance: Normal appearance. She is not ill-appearing.  HENT:     Head: Normocephalic and atraumatic.  Cardiovascular:     Rate and Rhythm: Normal rate and regular  rhythm.  Pulmonary:     Effort: Pulmonary effort is normal.     Breath sounds: Normal breath sounds. No wheezing, rhonchi or rales.     Comments: CTA Musculoskeletal:        General: Normal range of motion.  Skin:    General: Skin is warm and dry.  Neurological:     General: No focal deficit present.     Mental Status: She is alert and oriented to person, place, and time. Mental status is at baseline.  Psychiatric:        Mood and Affect: Mood normal.        Behavior: Behavior normal.        Thought Content: Thought content normal.        Judgment: Judgment normal.      Lab Results:  CBC    Component Value Date/Time   WBC 10.1 10/01/2021 1112   WBC 10.0 01/25/2021 1559   RBC 4.43 10/01/2021 1112   RBC 4.29 01/25/2021 1559   HGB 13.0 10/01/2021 1112   HCT 39.6 10/01/2021 1112   PLT 311 10/01/2021 1112   MCV 89 10/01/2021 1112   MCH 29.3 10/01/2021 1112   MCH 29.6 01/25/2021 1559   MCHC 32.8 10/01/2021 1112   MCHC 31.9 01/25/2021 1559   RDW 12.9 10/01/2021 1112   LYMPHSABS 2.5 01/25/2021 1559   MONOABS 0.8 01/25/2021 1559   EOSABS 0.1 01/25/2021 1559   BASOSABS 0.1 01/25/2021 1559    BMET    Component Value Date/Time   NA 141 05/06/2022 0740   K 4.4 05/06/2022 0740   CL 101 05/06/2022 0740   CO2 25 05/06/2022 0740   GLUCOSE 90 05/06/2022 0740   GLUCOSE 82 01/25/2021 1559   BUN 19 05/06/2022 0740   CREATININE 0.54 (L) 05/06/2022 0740   CALCIUM 9.8 05/06/2022 0740   GFRNONAA >60 01/25/2021 1559   GFRAA >60 10/15/2017 1944    BNP No results found for: "BNP"  ProBNP No results found for: "PROBNP"  Imaging: No results found.   Assessment & Plan:   CAP (community acquired pneumonia) - Symptomatically resolved, getting repeat CXR today   COPD (chronic obstructive pulmonary disease) ([place]) - Pulmonary function testing today showed no evidence of obstructive lung disease/ FEV1 82% predicted. Diffusion capacity was reduced but normal when adjusted for  lung volumes. 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 She is not symptomatic. Denies shortness of breath, chest tightness, wheezing or cough. No changes to current treatment plan, continue to observe. FU in 6 months with Dr. Lamonte Sakai or sooner if she develops shortness of breath or wheezing.    Martyn Ehrich, NP 05/20/2022

## 2022-05-20 NOTE — Progress Notes (Signed)
Full PFT Performed Today  

## 2022-05-21 NOTE — Progress Notes (Signed)
Please let patient know CXR showed decrease opacity on chest imaging, consistent with resolving pneumonia. Needs repeat CXR in another 4-6 weeks until fully resolved. Can you please order

## 2022-05-22 ENCOUNTER — Other Ambulatory Visit: Payer: Self-pay

## 2022-05-22 DIAGNOSIS — J189 Pneumonia, unspecified organism: Secondary | ICD-10-CM

## 2022-07-02 ENCOUNTER — Ambulatory Visit (INDEPENDENT_AMBULATORY_CARE_PROVIDER_SITE_OTHER): Payer: Medicare HMO

## 2022-07-02 DIAGNOSIS — J189 Pneumonia, unspecified organism: Secondary | ICD-10-CM | POA: Diagnosis not present

## 2022-07-04 NOTE — Progress Notes (Signed)
Ok, thanks [employer]

## 2022-09-22 NOTE — H&P (View-Only) (Signed)
 Cardiology Office Note:    Date:  09/25/2022   ID:  Kristina Dennis, DOB 05/04/1945, MRN [id number]  PCP:  Millsaps, Kimberly, NP   [facility] Providers Cardiologist:  Philip Nahser, MD     Referring MD: Millsaps, Kimberly, NP   Chief Complaint: llation  History of Present Illness:    Kristina Dennis is a very pleasant 76 y.o. female with a hx of HTN, coronary artery calcifications, PAF, former tobacco abuse, COPD, and anxiety.   Seen initially by Dr. Nahser in 2012, she reestablished care Feb 2020. She was noted to have coronary artery calcifications on chest CT June 2019. At that time, she was taking amlodipine on a PRN basis, about once every week or 2.   Presented to ED on 01/24/21 with sore throat, was walking out of the ED and felt sudden onset of palpiations. Found to be in AF with RVR which resolved spontaneously.She was started on Eliquis 5 mg BID for OAC with CHA2DS2-VASc score of 4.   Last cardiology clinic visit was 01/28/22 with Dr. Nahser at which time she reported frustration with diagnosis of AF, "waiting every day to see if she is going to die or have a stroke." She continued to exercise on a regular basis. Maintaining sinus rhythm on metoprolol. Was encouraged to f/u with PCP regarding anxiety.   Seen by me on 04/30/22 for 3 month follow-up. Had pneumonia from October 9 until end of December - it took 3 antibiotics. Celebrated a birthday [birth year], was feeling well at that time. No frequent symptoms of a fib, on one occasion, noted palpitations for approximately 10 minutes. Home BP and HR very well-controlled. Reports one episode of "angina" described as a feeling of grabbing or squeezing pain. Only once, no reoccurrence. Was told in the past that it was angina and she took ntg for a while. Symptoms previously occurred more frequently when she worked in a very stressful environment. Symptom associated with pneumonia was pain in her shins, like shin splints. She does get  short of breath now with vacuuming. No orthopnea, PND, edema, presyncope, and syncope.  Repeat cholesterol panel revealed LDL C of 241 previously 98 in 2020. She was referred to lipid clinic.  Seen by Melissa Maccia, RPH on 05/09/22 and reported history of statin therapy for 40 years.  She stopped it when she started Eliquis.  Coming off rosuvastatin she noticed all of her joint pains have gone away.  She reported conviction that there is no benefit to cholesterol treatment.  After lengthy discussion she did not wish to start any medication for elevated cholesterol.  Today, she is here alone for follow-up. Doing well. On one occasion, she had an episode of feeling discomfort that she thinks is a fib. Heart felt "erratic" for about 45 minutes then resolved on its own.  This has not occurred again. Going to exercise class 3 x per week. Cannot walk up and down the gym floor without stopping. Gets to one end without difficulty, but cannot get back without resting.  No orthopnea, PND, edema, presyncope, syncope. Is wheezing, has had problems with allergies since January, cough is worse with eating. No bleeding concerns. Lengthy discussion with models about atherosclerosis and heart anatomy.   Past Medical History:  Diagnosis Date   Arthritis    Colitis 2003   per path report from 2003: non-specific.  normal colonoscopy in 04/2011.    GERD (gastroesophageal reflux disease)    Hypertension    Irritable bowel   syndrome    Osteoporosis    Palpitations    Pneumonia    hx   Rhinitis     Past Surgical History:  Procedure Laterality Date   BUNIONECTOMY Left    COLONOSCOPY     DILATION AND CURETTAGE OF UTERUS     ESOPHAGOGASTRODUODENOSCOPY  2021   KNEE SURGERY Right    arthroscopy   ORIF WRIST FRACTURE Left 12/13/2013   Procedure: OPEN REDUCTION INTERNAL FIXATION (ORIF) WRIST FRACTURE;  Surgeon: Gregory Scott Dean, MD;  Location: [facility] OR;  Service: Orthopedics;  Laterality: Left;   TONSILLECTOMY      TUBAL LIGATION      Current Medications: Current Meds  Medication Sig   albuterol (VENTOLIN HFA) 108 (90 Base) MCG/ACT inhaler Inhale 2 puffs into the lungs every 6 (six) hours as needed for wheezing or shortness of breath.   Ascorbic Acid (VITAMIN C) 1000 MG tablet Take 1,000 mg by mouth.   Coenzyme Q10 (CO Q 10) 10 MG CAPS Take 1 capsule by mouth daily in the afternoon.   ELDERBERRY PO Take 1 capsule by mouth daily in the afternoon.   ELIQUIS 5 MG TABS tablet TAKE 1 TABLET BY MOUTH TWICE A DAY   Magnesium 200 MG TABS Take by mouth.   metoprolol succinate (TOPROL-XL) 25 MG 24 hr tablet Take 1 tablet (25 mg total) by mouth daily.   VITAMIN E PO Take 1 tablet by mouth 3 (three) times daily.     Allergies:   Hydrocodone, Amlodipine, Azithromycin, Excedrin extra strength [aspirin-acetaminophen-caffeine], Niacin, Niacin and related, Sulfa antibiotics, Sulfasalazine, and Latex   Social History   Socioeconomic History   Marital status: Married    Spouse name: Not on file   Number of children: 2   Years of education: Not on file   Highest education level: Not on file  Occupational History   Occupation: Retired  Tobacco Use   Smoking status: Former    Packs/day: 0.25    Years: 50.00    Additional pack years: 0.00    Total pack years: 12.50    Types: Cigarettes    Quit date: 2020    Years since quitting: 4.4   Smokeless tobacco: Never   Tobacco comments:    Patient smokes 5 cigarettes a day  Vaping Use   Vaping Use: Never used  Substance and Sexual Activity   Alcohol use: No    Alcohol/week: 0.0 standard drinks of alcohol   Drug use: No   Sexual activity: Not on file  Other Topics Concern   Not on file  Social History Narrative   Married to Walter-   2 children   Retired   Former smoker no alcohol tobacco or drug use currently   Social Determinants of Health   Financial Resource Strain: Not on file  Food Insecurity: Not on file  Transportation Needs: Not on file   Physical Activity: Not on file  Stress: Not on file  Social Connections: Not on file     Family History: The patient's family history includes Arrhythmia in her mother; Cancer in her sister; Colon cancer (age of onset: 60) in her sister; Hypertension in her father; Rectal cancer (age of onset: 60) in her sister. There is no history of Pancreatic cancer, Esophageal cancer, or Stomach cancer.  ROS:   Please see the history of present illness.    All other systems reviewed and are negative.  Labs/Other Studies Reviewed:    The following studies were reviewed today:  Lexiscan   Myoview 02/06/21   Lexiscan stress is electrcially negative for ischemai   LV perfusion is normal with minimal thinning of the inferior apex.  PResent in both rest and stress images There is no evidence of ischemia. There is no evidence of infarction.  COnsistent with probable soft tissue attenuation (mild)   Left ventricular function is normal. Nuclear stress EF: 76 %.   No change from 2020   Lexiscan stress is electrcially negative for ischemia Myoview scan with normal minimall inferior thinning at apex consistent with probable soft tissue attenuation, no ischemia or scar.   LVEF 76%  Recent Labs: 10/01/2021: Hemoglobin 13.0; Platelets 311; TSH 1.110 05/06/2022: ALT 16; BUN 19; Creatinine, Ser 0.54; Potassium 4.4; Sodium 141  Recent Lipid Panel    Component Value Date/Time   CHOL 315 (H) 05/06/2022 0740   TRIG 76 05/06/2022 0740   HDL 62 05/06/2022 0740   CHOLHDL 5.1 (H) 05/06/2022 0740   LDLCALC 241 (H) 05/06/2022 0740     Risk Assessment/Calculations:    CHA2DS2-VASc Score = 4   This indicates a 4.8% annual risk of stroke. The patient's score is based upon: CHF History: 0 HTN History: 1 Diabetes History: 0 Stroke History: 0 Vascular Disease History: 0 Age Score: 2 Gender Score: 1    Physical Exam:    VS:  BP 102/70   Pulse (!) 56   Ht 5' 6" (1.676 m)   Wt 90 lb (40.8 kg)   LMP  05/03/1987   SpO2 99%   BMI 14.53 kg/m     Wt Readings from Last 3 Encounters:  09/25/22 90 lb (40.8 kg)  05/20/22 88 lb (39.9 kg)  04/30/22 86 lb (39 kg)     GEN:  Thin, frail in no acute distress HEENT: Normal NECK: No JVD; No carotid bruits CARDIAC: RRR, no murmurs, rubs, gallops RESPIRATORY:  Clear to auscultation without rales, wheezing or rhonchi  ABDOMEN: Soft, non-tender, non-distended MUSCULOSKELETAL:  No edema; No deformity. 2+  pedal pulses, equal bilaterally SKIN: Warm and dry NEUROLOGIC:  Alert and oriented x 3 PSYCHIATRIC:  Normal affect   EKG:  EKG is ordered today. EKG reveals sinus bradycardia at 56 bpm, no acute ST abnormality  Diagnoses:    1. Pure hypercholesterolemia   2. Coronary artery calcification seen on CT scan   3. Paroxysmal atrial fibrillation (HCC)   4. Anticoagulant long-term use   5. Essential hypertension   6. Activity intolerance     Assessment and Plan:     PAF on chronic anticoagulation: EKG reveals sinus bradycardia at 56 bpm. Rare palpitations, one concerning event in recent months. No sustained episodes of tachycardia. No bleeding concerns. Continue Eliquis 5 mg twice daily which is appropriate dose for stroke prevention for CHA2DS2-VASc score of 4. Continue metoprolol for rate control.  We will get echocardiogram for evaluation of heart and valve function.   Activity intolerance: Having more difficulty with walking far distances, says her legs get fatigued.  No presyncope, syncope. No falls. No prior echocardiogram for evaluation of heart/valve function.  We will get 1 as noted above.  Hypertension: BP is well controlled.   Coronary artery calcifications on CT: Moderate atherosclerotic disease on CT 2016. Normal LV function, no ischemia on NST 02/06/21. Lengthy discussion about atherosclerosis progression in the setting of elevated LDL. Had a concerning episode of chest pain a few months ago, recommended further ischemia evaluation  at last office visit 04/2022. We will get coronary CTA for mapping of coronary anatomy.     Hyperlipidemia LDL goal < 70: LDL-C 241 on 05/06/22. Prior statin therapy for 40 years, stopped due to joint aches. Referred to lipid clinic, unwilling to resume treatment. Visual explanation of level of plaques and risk of rupture. Encouraged her to consider resuming lipid lowering therapy. We will get coronary CTA for further risk stratification.   Addendum: Coronary CTA completed on 10/18/22 revealed high coronary calcium score and total plaque volume with possible hemodynamically flow-limiting lesion in the mid LAD on FFR. Recommendation for cardiac cath. Case reviewed with primary cardiologist, Dr. Nahser, who agrees. Risks of cardiac catheterization reviewed with patient and she agrees to proceed.     Disposition: 6 months with Dr. Nahser  Informed Consent   Shared Decision Making/Informed Consent The risks [stroke (1 in 1000), death (1 in 1000), kidney failure [usually temporary] (1 in 500), bleeding (1 in 200), allergic reaction [possibly serious] (1 in 200)], benefits (diagnostic support and management of coronary artery disease) and alternatives of a cardiac catheterization were discussed in detail with Kristina Dennis and she is willing to proceed.      Medication Adjustments/Labs and Tests Ordered: Current medicines are reviewed at length with the patient today.  Concerns regarding medicines are outlined above.  Orders Placed This Encounter  Procedures   CT CORONARY MORPH W/CTA COR W/SCORE W/CA W/CM &/OR WO/CM   Basic Metabolic Panel (BMET)   EKG 12-Lead   ECHOCARDIOGRAM COMPLETE   No orders of the defined types were placed in this encounter.   Patient Instructions  Medication Instructions:   Your physician recommends that you continue on your current medications as directed. Please refer to the Current Medication list given to you today.   *If you need a refill on your cardiac medications  before your next appointment, please call your pharmacy*   Lab Work:  TODAY!!!! BMET  If you have labs (blood work) drawn today and your tests are completely normal, you will receive your results only by: MyChart Message (if you have MyChart) OR A paper copy in the mail If you have any lab test that is abnormal or we need to change your treatment, we will call you to review the results.   Testing/Procedures:    Your cardiac CT will be scheduled at one of the below locations:   [facility] [address] [place], [place] [address] [phone]  If scheduled at [facility], please arrive at the Women's and Children's Entrance (Entrance C2) of [facility] 30 minutes prior to test start time. You can use the FREE valet parking offered at entrance C (encouraged to control the heart rate for the test)  Proceed to the [facility] Department (first floor) to check-in and test prep.  All radiology patients and guests should use entrance C2 at [facility], accessed from [address], even though the hospital's physical address listed is [address].    If scheduled at [facility] or [facility], please arrive 15 mins early for check-in and test prep.   Please follow these instructions carefully (unless otherwise directed):   On the Night Before the Test: Be sure to Drink plenty of water. Do not consume any caffeinated/decaffeinated beverages or chocolate 12 hours prior to your test. Do not take any antihistamines 12 hours prior to your test.  On the Day of the Test: Drink plenty of water until 1 hour prior to the test. Do not eat any food 1 hour   prior to test. You may take your regular medications prior to the test.  Take TOPROL ONE TABLET BY MOUTH (25 MG)  two hours prior to test. FEMALES- please wear underwire-free bra if available, avoid dresses & tight  clothing       After the Test: Drink plenty of water. After receiving IV contrast, you may experience a mild flushed feeling. This is normal. On occasion, you may experience a mild rash up to 24 hours after the test. This is not dangerous. If this occurs, you can take Benadryl 25 mg and increase your fluid intake. If you experience trouble breathing, this can be serious. If it is severe call 911 IMMEDIATELY. If it is mild, please call our office. We will call to schedule your test 2-4 weeks out understanding that some insurance companies will need an authorization prior to the service being performed.   For non-scheduling related questions, please contact the cardiac imaging nurse navigator should you have any questions/concerns: Kristina Dennis, Cardiac Imaging Nurse Navigator Kristina Dennis, Cardiac Imaging Nurse Navigator [facility] Direct Office Dial: [phone]   For scheduling needs, including cancellations and rescheduling, please call Brittany, [phone].   Your physician has requested that you have an echocardiogram. Echocardiography is a painless test that uses sound waves to create images of your heart. It provides your doctor with information about the size and shape of your heart and how well your heart's chambers and valves are working. This procedure takes approximately one hour. There are no restrictions for this procedure. Please do NOT wear  perfume  or lotions (deodorant is allowed). Please arrive 15 minutes prior to your appointment time.  Follow-Up: At [facility], you and your health needs are our priority.  As part of our continuing mission to provide you with exceptional heart care, we have created designated Provider Care Teams.  These Care Teams include your primary Cardiologist (physician) and Advanced Practice Providers (APPs -  Physician Assistants and Nurse Practitioners) who all work together to provide you with the care  you need, when you need it.  We recommend signing up for the patient portal called "MyChart".  Sign up information is provided on this After Visit Summary.  MyChart is used to connect with patients for Virtual Visits (Telemedicine).  Patients are able to view lab/test results, encounter notes, upcoming appointments, etc.  Non-urgent messages can be sent to your provider as well.   To learn more about what you can do with MyChart, go to [email or web address].    Your next appointment:   6 month(s)  Provider:   Philip Nahser, MD       Signed, Bladimir Auman M, NP  09/25/2022 3:50 PM     HeartCare 

## 2022-09-22 NOTE — Progress Notes (Addendum)
Cardiology Office Note:    Date:  09/25/2022   ID:  Kristina Dennis, Kristina Dennis 10-14-1945, MRN [id number]  PCP:  Marva Panda, NP   [facility] Providers Cardiologist:  Kristeen Miss, MD     Referring MD: Marva Panda, NP   Chief Complaint: llation  History of Present Illness:    Kristina Dennis is a very pleasant 77 y.o. female with a hx of HTN, coronary artery calcifications, PAF, former tobacco abuse, COPD, and anxiety.   Seen initially by Dr. Elease Hashimoto in 2012, she reestablished care Feb 2020. She was noted to have coronary artery calcifications on chest CT June 2019. At that time, she was taking amlodipine on a PRN basis, about once every week or 2.   Presented to ED on 01/24/21 with sore throat, was walking out of the ED and felt sudden onset of palpiations. Found to be in AF with RVR which resolved spontaneously.She was started on Eliquis 5 mg BID for OAC with CHA2DS2-VASc score of 4.   Last cardiology clinic visit was 01/28/22 with Dr. Elease Hashimoto at which time she reported frustration with diagnosis of AF, "waiting every day to see if she is going to die or have a stroke." She continued to exercise on a regular basis. Maintaining sinus rhythm on metoprolol. Was encouraged to f/u with PCP regarding anxiety.   Seen by me on 04/30/22 for 3 month follow-up. Had pneumonia from October 9 until end of December - it took 3 antibiotics. Celebrated a birthday [birth year], was feeling well at that time. No frequent symptoms of a fib, on one occasion, noted palpitations for approximately 10 minutes. Home BP and HR very well-controlled. Reports one episode of "angina" described as a feeling of grabbing or squeezing pain. Only once, no reoccurrence. Was told in the past that it was angina and she took ntg for a while. Symptoms previously occurred more frequently when she worked in a very stressful environment. Symptom associated with pneumonia was pain in her shins, like shin splints. She does get  short of breath now with vacuuming. No orthopnea, PND, edema, presyncope, and syncope.  Repeat cholesterol panel revealed LDL C of 241 previously 98 in 2020. She was referred to lipid clinic.  Seen by Malena Peer, RPH on 05/09/22 and reported history of statin therapy for 40 years.  She stopped it when she started Eliquis.  Coming off rosuvastatin she noticed all of her joint pains have gone away.  She reported conviction that there is no benefit to cholesterol treatment.  After lengthy discussion she did not wish to start any medication for elevated cholesterol.  Today, she is here alone for follow-up. Doing well. On one occasion, she had an episode of feeling discomfort that she thinks is a fib. Heart felt "erratic" for about 45 minutes then resolved on its own.  This has not occurred again. Going to exercise class 3 x per week. Cannot walk up and down the gym floor without stopping. Gets to one end without difficulty, but cannot get back without resting.  No orthopnea, PND, edema, presyncope, syncope. Is wheezing, has had problems with allergies since January, cough is worse with eating. No bleeding concerns. Lengthy discussion with models about atherosclerosis and heart anatomy.   Past Medical History:  Diagnosis Date   Arthritis    Colitis 2003   per path report from 2003: non-specific.  normal colonoscopy in 04/2011.    GERD (gastroesophageal reflux disease)    Hypertension    Irritable bowel  syndrome    Osteoporosis    Palpitations    Pneumonia    hx   Rhinitis     Past Surgical History:  Procedure Laterality Date   BUNIONECTOMY Left    COLONOSCOPY     DILATION AND CURETTAGE OF UTERUS     ESOPHAGOGASTRODUODENOSCOPY  2021   KNEE SURGERY Right    arthroscopy   ORIF WRIST FRACTURE Left 12/13/2013   Procedure: OPEN REDUCTION INTERNAL FIXATION (ORIF) WRIST FRACTURE;  Surgeon: Cammy Copa, MD;  Location: [facility] OR;  Service: Orthopedics;  Laterality: Left;   TONSILLECTOMY      TUBAL LIGATION      Current Medications: Current Meds  Medication Sig   albuterol (VENTOLIN HFA) 108 (90 Base) MCG/ACT inhaler Inhale 2 puffs into the lungs every 6 (six) hours as needed for wheezing or shortness of breath.   Ascorbic Acid (VITAMIN C) 1000 MG tablet Take 1,000 mg by mouth.   Coenzyme Q10 (CO Q 10) 10 MG CAPS Take 1 capsule by mouth daily in the afternoon.   ELDERBERRY PO Take 1 capsule by mouth daily in the afternoon.   ELIQUIS 5 MG TABS tablet TAKE 1 TABLET BY MOUTH TWICE A DAY   Magnesium 200 MG TABS Take by mouth.   metoprolol succinate (TOPROL-XL) 25 MG 24 hr tablet Take 1 tablet (25 mg total) by mouth daily.   VITAMIN E PO Take 1 tablet by mouth 3 (three) times daily.     Allergies:   Hydrocodone, Amlodipine, Azithromycin, Excedrin extra strength [aspirin-acetaminophen-caffeine], Niacin, Niacin and related, Sulfa antibiotics, Sulfasalazine, and Latex   Social History   Socioeconomic History   Marital status: Married    Spouse name: Not on file   Number of children: 2   Years of education: Not on file   Highest education level: Not on file  Occupational History   Occupation: Retired  Tobacco Use   Smoking status: Former    Packs/day: 0.25    Years: 50.00    Additional pack years: 0.00    Total pack years: 12.50    Types: Cigarettes    Quit date: 2020    Years since quitting: 4.4   Smokeless tobacco: Never   Tobacco comments:    Patient smokes 5 cigarettes a day  Vaping Use   Vaping Use: Never used  Substance and Sexual Activity   Alcohol use: No    Alcohol/week: 0.0 standard drinks of alcohol   Drug use: No   Sexual activity: Not on file  Other Topics Concern   Not on file  Social History Narrative   Married to [place]-   2 children   Retired   Former smoker no alcohol tobacco or drug use currently   Social Determinants of Corporate investment banker Strain: Not on Ship broker Insecurity: Not on file  Transportation Needs: Not on file   Physical Activity: Not on file  Stress: Not on file  Social Connections: Not on file     Family History: The patient's family history includes Arrhythmia in her mother; Cancer in her sister; Colon cancer (age of onset: 60) in her sister; Hypertension in her father; Rectal cancer (age of onset: 19) in her sister. There is no history of Pancreatic cancer, Esophageal cancer, or Stomach cancer.  ROS:   Please see the history of present illness.    All other systems reviewed and are negative.  Labs/Other Studies Reviewed:    The following studies were reviewed today:  Lexiscan  Myoview 02/06/21   Lexiscan stress is electrcially negative for ischemai   LV perfusion is normal with minimal thinning of the inferior apex.  PResent in both rest and stress images There is no evidence of ischemia. There is no evidence of infarction.  COnsistent with probable soft tissue attenuation (mild)   Left ventricular function is normal. Nuclear stress EF: 76 %.   No change from 2020   Lexiscan stress is electrcially negative for ischemia Myoview scan with normal minimall inferior thinning at apex consistent with probable soft tissue attenuation, no ischemia or scar.   LVEF 76%  Recent Labs: 10/01/2021: Hemoglobin 13.0; Platelets 311; TSH 1.110 05/06/2022: ALT 16; BUN 19; Creatinine, Ser 0.54; Potassium 4.4; Sodium 141  Recent Lipid Panel    Component Value Date/Time   CHOL 315 (H) 05/06/2022 0740   TRIG 76 05/06/2022 0740   HDL 62 05/06/2022 0740   CHOLHDL 5.1 (H) 05/06/2022 0740   LDLCALC 241 (H) 05/06/2022 0740     Risk Assessment/Calculations:    CHA2DS2-VASc Score = 4   This indicates a 4.8% annual risk of stroke. The patient's score is based upon: CHF History: 0 HTN History: 1 Diabetes History: 0 Stroke History: 0 Vascular Disease History: 0 Age Score: 2 Gender Score: 1    Physical Exam:    VS:  BP 102/70   Pulse (!) 56   Ht 5\' 6"  (1.676 m)   Wt 90 lb (40.8 kg)   LMP  05/03/1987   SpO2 99%   BMI 14.53 kg/m     Wt Readings from Last 3 Encounters:  09/25/22 90 lb (40.8 kg)  05/20/22 88 lb (39.9 kg)  04/30/22 86 lb (39 kg)     GEN:  Thin, frail in no acute distress HEENT: Normal NECK: No JVD; No carotid bruits CARDIAC: RRR, no murmurs, rubs, gallops RESPIRATORY:  Clear to auscultation without rales, wheezing or rhonchi  ABDOMEN: Soft, non-tender, non-distended MUSCULOSKELETAL:  No edema; No deformity. 2+  pedal pulses, equal bilaterally SKIN: Warm and dry NEUROLOGIC:  Alert and oriented x 3 PSYCHIATRIC:  Normal affect   EKG:  EKG is ordered today. EKG reveals sinus bradycardia at 56 bpm, no acute ST abnormality  Diagnoses:    1. Pure hypercholesterolemia   2. Coronary artery calcification seen on CT scan   3. Paroxysmal atrial fibrillation (HCC)   4. Anticoagulant long-term use   5. Essential hypertension   6. Activity intolerance     Assessment and Plan:     PAF on chronic anticoagulation: EKG reveals sinus bradycardia at 56 bpm. Rare palpitations, one concerning event in recent months. No sustained episodes of tachycardia. No bleeding concerns. Continue Eliquis 5 mg twice daily which is appropriate dose for stroke prevention for CHA2DS2-VASc score of 4. Continue metoprolol for rate control.  We will get echocardiogram for evaluation of heart and valve function.   Activity intolerance: Having more difficulty with walking far distances, says her legs get fatigued.  No presyncope, syncope. No falls. No prior echocardiogram for evaluation of heart/valve function.  We will get 1 as noted above.  Hypertension: BP is well controlled.   Coronary artery calcifications on CT: Moderate atherosclerotic disease on CT 2016. Normal LV function, no ischemia on NST 02/06/21. Lengthy discussion about atherosclerosis progression in the setting of elevated LDL. Had a concerning episode of chest pain a few months ago, recommended further ischemia evaluation  at last office visit 04/2022. We will get coronary CTA for mapping of coronary anatomy.  Hyperlipidemia LDL goal < 70: LDL-C 241 on 05/06/22. Prior statin therapy for 40 years, stopped due to joint aches. Referred to lipid clinic, unwilling to resume treatment. Visual explanation of level of plaques and risk of rupture. Encouraged her to consider resuming lipid lowering therapy. We will get coronary CTA for further risk stratification.   Addendum: Coronary CTA completed on 10/18/22 revealed high coronary calcium score and total plaque volume with possible hemodynamically flow-limiting lesion in the mid LAD on FFR. Recommendation for cardiac cath. Case reviewed with primary cardiologist, Dr. Elease Hashimoto, who agrees. Risks of cardiac catheterization reviewed with patient and she agrees to proceed.     Disposition: 6 months with Dr. Elease Hashimoto  Informed Consent   Shared Decision Making/Informed Consent The risks [stroke (1 in 1000), death (1 in 1000), kidney failure [usually temporary] (1 in 500), bleeding (1 in 200), allergic reaction [possibly serious] (1 in 200)], benefits (diagnostic support and management of coronary artery disease) and alternatives of a cardiac catheterization were discussed in detail with Ms. Scammon and she is willing to proceed.      Medication Adjustments/Labs and Tests Ordered: Current medicines are reviewed at length with the patient today.  Concerns regarding medicines are outlined above.  Orders Placed This Encounter  Procedures   CT CORONARY MORPH W/CTA COR W/SCORE W/CA W/CM &/OR WO/CM   Basic Metabolic Panel (BMET)   EKG 12-Lead   ECHOCARDIOGRAM COMPLETE   No orders of the defined types were placed in this encounter.   Patient Instructions  Medication Instructions:   Your physician recommends that you continue on your current medications as directed. Please refer to the Current Medication list given to you today.   *If you need a refill on your cardiac medications  before your next appointment, please call your pharmacy*   Lab Work:  TODAY!!!! BMET  If you have labs (blood work) drawn today and your tests are completely normal, you will receive your results only by: MyChart Message (if you have MyChart) OR A paper copy in the mail If you have any lab test that is abnormal or we need to change your treatment, we will call you to review the results.   Testing/Procedures:    Your cardiac CT will be scheduled at one of the below locations:   [facility] [address] [place], [place] [address] [phone]  If scheduled at [facility], please arrive at the [facility] and Children's Entrance (Entrance C2) of [facility] 30 minutes prior to test start time. You can use the FREE valet parking offered at entrance C (encouraged to control the heart rate for the test)  Proceed to the [facility] Radiology Department (first floor) to check-in and test prep.  All radiology patients and guests should use entrance C2 at [facility], accessed from [facility], even though the hospital's physical address listed is [address]    If scheduled at [facility] or [facility], please arrive 15 mins early for check-in and test prep.   Please follow these instructions carefully (unless otherwise directed):   On the Night Before the Test: Be sure to Drink plenty of water. Do not consume any caffeinated/decaffeinated beverages or chocolate 12 hours prior to your test. Do not take any antihistamines 12 hours prior to your test.  On the Day of the Test: Drink plenty of water until 1 hour prior to the test. Do not eat any food 1 hour  prior to test. You may take your regular medications prior to the test.  Take TOPROL ONE TABLET BY MOUTH (25 MG)  two hours prior to test. FEMALES- please wear underwire-free bra if available, avoid dresses & tight  clothing       After the Test: Drink plenty of water. After receiving IV contrast, you may experience a mild flushed feeling. This is normal. On occasion, you may experience a mild rash up to 24 hours after the test. This is not dangerous. If this occurs, you can take Benadryl 25 mg and increase your fluid intake. If you experience trouble breathing, this can be serious. If it is severe call 911 IMMEDIATELY. If it is mild, please call our office. We will call to schedule your test 2-4 weeks out understanding that some insurance companies will need an authorization prior to the service being performed.   For non-scheduling related questions, please contact the cardiac imaging nurse navigator should you have any questions/concerns: Rockwell Alexandria, Cardiac Imaging Nurse Navigator Larey Brick, Cardiac Imaging Nurse Navigator [facility] Direct Office Dial: [phone]   For scheduling needs, including cancellations and rescheduling, please call [place], [phone].   Your physician has requested that you have an echocardiogram. Echocardiography is a painless test that uses sound waves to create images of your heart. It provides your doctor with information about the size and shape of your heart and how well your heart's chambers and valves are working. This procedure takes approximately one hour. There are no restrictions for this procedure. Please do NOT wear  perfume  or lotions (deodorant is allowed). Please arrive 15 minutes prior to your appointment time.  Follow-Up: At [facility], you and your health needs are our priority.  As part of our continuing mission to provide you with exceptional heart care, we have created designated Provider Care Teams.  These Care Teams include your primary Cardiologist (physician) and Advanced Practice Providers (APPs -  Physician Assistants and Nurse Practitioners) who all work together to provide you with the care  you need, when you need it.  We recommend signing up for the patient portal called "MyChart".  Sign up information is provided on this After Visit Summary.  MyChart is used to connect with patients for Virtual Visits (Telemedicine).  Patients are able to view lab/test results, encounter notes, upcoming appointments, etc.  Non-urgent messages can be sent to your provider as well.   To learn more about what you can do with MyChart, go to [email or web address].    Your next appointment:   6 month(s)  Provider:   Kristeen Miss, MD       Signed, Levi Aland, NP  09/25/2022 3:50 PM    [facility]

## 2022-09-25 ENCOUNTER — Encounter: Payer: Self-pay | Admitting: Nurse Practitioner

## 2022-09-25 ENCOUNTER — Ambulatory Visit: Payer: Medicare HMO | Attending: Nurse Practitioner | Admitting: Nurse Practitioner

## 2022-09-25 VITALS — BP 102/70 | HR 56 | Ht 66.0 in | Wt 90.0 lb

## 2022-09-25 DIAGNOSIS — I480 Paroxysmal atrial fibrillation: Secondary | ICD-10-CM | POA: Diagnosis not present

## 2022-09-25 DIAGNOSIS — I2510 Atherosclerotic heart disease of native coronary artery without angina pectoris: Secondary | ICD-10-CM | POA: Diagnosis not present

## 2022-09-25 DIAGNOSIS — R6889 Other general symptoms and signs: Secondary | ICD-10-CM

## 2022-09-25 DIAGNOSIS — I10 Essential (primary) hypertension: Secondary | ICD-10-CM

## 2022-09-25 DIAGNOSIS — Z7901 Long term (current) use of anticoagulants: Secondary | ICD-10-CM

## 2022-09-25 DIAGNOSIS — E7800 Pure hypercholesterolemia, unspecified: Secondary | ICD-10-CM | POA: Diagnosis not present

## 2022-09-25 LAB — BASIC METABOLIC PANEL
BUN/Creatinine Ratio: 29 — ABNORMAL HIGH (ref 12–28)
BUN: 17 mg/dL (ref 8–27)
CO2: 25 mmol/L (ref 20–29)
Calcium: 9.7 mg/dL (ref 8.7–10.3)
Chloride: 100 mmol/L (ref 96–106)
Creatinine, Ser: 0.59 mg/dL (ref 0.57–1.00)
Glucose: 79 mg/dL (ref 70–99)
Potassium: 4.6 mmol/L (ref 3.5–5.2)
Sodium: 139 mmol/L (ref 134–144)
eGFR: 93 mL/min/{1.73_m2} (ref >59)

## 2022-09-25 NOTE — Patient Instructions (Signed)
Medication Instructions:   Your physician recommends that you continue on your current medications as directed. Please refer to the Current Medication list given to you today.   *If you need a refill on your cardiac medications before your next appointment, please call your pharmacy*   Lab Work:  TODAY!!!! BMET  If you have labs (blood work) drawn today and your tests are completely normal, you will receive your results only by: MyChart Message (if you have MyChart) OR A paper copy in the mail If you have any lab test that is abnormal or we need to change your treatment, we will call you to review the results.   Testing/Procedures:    Your cardiac CT will be scheduled at one of the below locations:   [facility] [address] [place], [place] [address] [phone]  If scheduled at [facility], please arrive at the [facility] and Children's Entrance (Entrance C2) of [facility] 30 minutes prior to test start time. You can use the FREE valet parking offered at entrance C (encouraged to control the heart rate for the test)  Proceed to the [facility] Radiology Department (first floor) to check-in and test prep.  All radiology patients and guests should use entrance C2 at [facility], accessed from [facility], even though the hospital's physical address listed is [address]    If scheduled at [facility] or [facility], please arrive 15 mins early for check-in and test prep.   Please follow these instructions carefully (unless otherwise directed):   On the Night Before the Test: Be sure to Drink plenty of water. Do not consume any caffeinated/decaffeinated beverages or chocolate 12 hours prior to your test. Do not take any antihistamines 12 hours prior to your test.  On the Day of the Test: Drink plenty of water until 1 hour prior to the test. Do not eat any food  1 hour prior to test. You may take your regular medications prior to the test.  Take TOPROL ONE TABLET BY MOUTH (25 MG)  two hours prior to test. FEMALES- please wear underwire-free bra if available, avoid dresses & tight clothing       After the Test: Drink plenty of water. After receiving IV contrast, you may experience a mild flushed feeling. This is normal. On occasion, you may experience a mild rash up to 24 hours after the test. This is not dangerous. If this occurs, you can take Benadryl 25 mg and increase your fluid intake. If you experience trouble breathing, this can be serious. If it is severe call 911 IMMEDIATELY. If it is mild, please call our office. We will call to schedule your test 2-4 weeks out understanding that some insurance companies will need an authorization prior to the service being performed.   For non-scheduling related questions, please contact the cardiac imaging nurse navigator should you have any questions/concerns: Rockwell Alexandria, Cardiac Imaging Nurse Navigator Larey Brick, Cardiac Imaging Nurse Navigator [place] [facility] Direct Office Dial: [phone]   For scheduling needs, including cancellations and rescheduling, please call [place], [phone].   Your physician has requested that you have an echocardiogram. Echocardiography is a painless test that uses sound waves to create images of your heart. It provides your doctor with information about the size and shape of your heart and how well your heart's chambers and valves are working. This procedure takes approximately one hour. There are no  restrictions for this procedure. Please do NOT wear  perfume  or lotions (deodorant is allowed). Please arrive 15 minutes prior to your appointment time.  Follow-Up: At [facility], you and your health needs are our priority.  As part of our continuing mission to provide you with exceptional heart care, we have created designated  Provider Care Teams.  These Care Teams include your primary Cardiologist (physician) and Advanced Practice Providers (APPs -  Physician Assistants and Nurse Practitioners) who all work together to provide you with the care you need, when you need it.  We recommend signing up for the patient portal called "MyChart".  Sign up information is provided on this After Visit Summary.  MyChart is used to connect with patients for Virtual Visits (Telemedicine).  Patients are able to view lab/test results, encounter notes, upcoming appointments, etc.  Non-urgent messages can be sent to your provider as well.   To learn more about what you can do with MyChart, go to [email or web address].    Your next appointment:   6 month(s)  Provider:   Kristeen Miss, MD

## 2022-09-30 ENCOUNTER — Ambulatory Visit: Payer: Medicare HMO | Admitting: Nurse Practitioner

## 2022-10-08 ENCOUNTER — Telehealth: Payer: Self-pay | Admitting: Primary Care

## 2022-10-08 NOTE — Telephone Encounter (Signed)
Patient would like to schedule CT scan. Patient phone number is [phone].

## 2022-10-09 NOTE — Telephone Encounter (Signed)
Spoke with patient. She is requesting order for a chest CT. She states this was discussed at her last visit, but I don't see that noted anywhere. She is having a CT on her heart on 6/27. She is wanting to know if she can do chest CT and heart CT at the same time. She states she spoke with the radiologist tech and she had advised she could just need an order  Dr. Delton Coombes can you please advise? Patient is supposed to be following up with you next month and Beth isnt here to ask

## 2022-10-09 NOTE — Telephone Encounter (Signed)
I reviewed her most recent CXR. Ok to order a CT chest without contrast to evaluate right upper lobe opacity >> I don't know if can be done in conjunction w her cardiac CT. Really all I need is for them to scan the entire lung fields (including the apices) but may not be possible to do with the cardiac protocol.

## 2022-10-11 ENCOUNTER — Other Ambulatory Visit: Payer: Self-pay

## 2022-10-11 DIAGNOSIS — R911 Solitary pulmonary nodule: Secondary | ICD-10-CM

## 2022-10-11 NOTE — Telephone Encounter (Signed)
Went over recc from Dr. Delton Coombes. Order has been placed for Chest CT has been placed. NFN

## 2022-10-16 ENCOUNTER — Encounter: Payer: Self-pay | Admitting: *Deleted

## 2022-10-17 ENCOUNTER — Telehealth (HOSPITAL_COMMUNITY): Payer: Self-pay | Admitting: *Deleted

## 2022-10-17 NOTE — Telephone Encounter (Signed)
Reaching out to patient to offer assistance regarding upcoming cardiac imaging study; pt verbalizes understanding of appt date/time, parking situation and where to check in, pre-test NPO status and medications ordered, and verified current allergies; name and call back number provided for further questions should they arise Alexandros Ewan RN Navigator Cardiac Imaging [facility] [phone] office [phone] cell  

## 2022-10-18 ENCOUNTER — Ambulatory Visit (HOSPITAL_COMMUNITY)
Admission: RE | Admit: 2022-10-18 | Discharge: 2022-10-18 | Disposition: A | Discharge status: Home / Self Care | Payer: Medicare HMO | Source: Ambulatory Visit | Attending: Nurse Practitioner | Admitting: Nurse Practitioner

## 2022-10-18 ENCOUNTER — Ambulatory Visit (HOSPITAL_BASED_OUTPATIENT_CLINIC_OR_DEPARTMENT_OTHER)
Admission: RE | Admit: 2022-10-18 | Discharge: 2022-10-18 | Disposition: A | Discharge status: Home / Self Care | Payer: Medicare HMO | Source: Ambulatory Visit | Attending: Cardiology | Admitting: Cardiology

## 2022-10-18 ENCOUNTER — Other Ambulatory Visit: Payer: Self-pay | Admitting: Cardiology

## 2022-10-18 DIAGNOSIS — E7800 Pure hypercholesterolemia, unspecified: Secondary | ICD-10-CM | POA: Diagnosis not present

## 2022-10-18 DIAGNOSIS — R931 Abnormal findings on diagnostic imaging of heart and coronary circulation: Secondary | ICD-10-CM | POA: Insufficient documentation

## 2022-10-18 DIAGNOSIS — I480 Paroxysmal atrial fibrillation: Secondary | ICD-10-CM | POA: Diagnosis present

## 2022-10-18 DIAGNOSIS — I2510 Atherosclerotic heart disease of native coronary artery without angina pectoris: Secondary | ICD-10-CM

## 2022-10-18 MED ORDER — NITROGLYCERIN 0.4 MG SL SUBL
0.8000 mg | SUBLINGUAL_TABLET | Freq: Once | SUBLINGUAL | Status: AC
  Administered 2022-10-18: 0.8 mg via SUBLINGUAL

## 2022-10-18 MED ORDER — NITROGLYCERIN 0.4 MG SL SUBL
SUBLINGUAL_TABLET | SUBLINGUAL | Status: AC
  Filled 2022-10-18: qty 2

## 2022-10-18 MED ORDER — IOHEXOL 350 MG/ML SOLN
95.0000 mL | Freq: Once | INTRAVENOUS | Status: AC | PRN
  Administered 2022-10-18: 95 mL via INTRAVENOUS

## 2022-10-21 ENCOUNTER — Encounter: Payer: Self-pay | Admitting: *Deleted

## 2022-10-21 ENCOUNTER — Telehealth: Payer: Self-pay | Admitting: *Deleted

## 2022-10-21 DIAGNOSIS — R931 Abnormal findings on diagnostic imaging of heart and coronary circulation: Secondary | ICD-10-CM

## 2022-10-21 NOTE — Telephone Encounter (Signed)
Call placed to pt.  She has been made aware of her procedure being scheduled.  Instructions verbally given to pt and also sent a copy to pt's mychart.  She will come in for BMET & CBC.           Cardiac/Peripheral Catheterization   You are scheduled for a Cardiac Catheterization on Friday, July 5 with Dr. Peter Swaziland.  1. Please arrive at the [facility] (Main Entrance A) at [facility]: [address] [place], [place] [address] at 7:00 AM (This time is 2 hour(s) before your procedure to ensure your preparation). Free valet parking service is available. You will check in at ADMITTING. The support person will be asked to wait in the waiting room.  It is OK to have someone drop you off and come back when you are ready to be discharged.        Special note: Every effort is made to have your procedure done on time. Please understand that emergencies sometimes delay scheduled procedures.  2. Diet: Do not eat solid foods after midnight.  You may have clear liquids until 5 AM the day of the procedure.  3. Labs: You will need to have blood drawn on   4. Medication instructions in preparation for your procedure:   Contrast Allergy: No  .   Delete this med list prior to printing instructions for patient.*  Stop taking Eliquis (Apixiban) on Tuesday, July 2.   On the morning of your procedure, take Aspirin 81 mg and any morning medicines NOT listed above.  You may use sips of water.  5. Plan to go home the same day, you will only stay overnight if medically necessary. 6. You MUST have a responsible adult to drive you home. 7. An adult MUST be with you the first 24 hours after you arrive home. 8. Bring a current list of your medications, and the last time and date medication taken. 9. Bring ID and current insurance cards. 10.Please wear clothes that are easy to get on and off and wear slip-on shoes.  Thank you for allowing [place] to care for you!   -- [place] [facility]

## 2022-10-22 ENCOUNTER — Ambulatory Visit: Payer: Medicare HMO | Attending: Nurse Practitioner

## 2022-10-22 DIAGNOSIS — R931 Abnormal findings on diagnostic imaging of heart and coronary circulation: Secondary | ICD-10-CM

## 2022-10-23 ENCOUNTER — Telehealth: Payer: Self-pay | Admitting: Nurse Practitioner

## 2022-10-23 ENCOUNTER — Telehealth: Payer: Self-pay | Admitting: *Deleted

## 2022-10-23 LAB — CBC
Hematocrit: 36.8 % (ref 34.0–46.6)
Hemoglobin: 12.7 g/dL (ref 11.1–15.9)
MCH: 30.5 pg (ref 26.6–33.0)
MCHC: 34.5 g/dL (ref 31.5–35.7)
MCV: 89 fL (ref 79–97)
Platelets: 299 10*3/uL (ref 150–450)
RBC: 4.16 x10E6/uL (ref 3.77–5.28)
RDW: 13.1 % (ref 11.7–15.4)
WBC: 10.3 10*3/uL (ref 3.4–10.8)

## 2022-10-23 LAB — BASIC METABOLIC PANEL
BUN/Creatinine Ratio: 42 — ABNORMAL HIGH (ref 12–28)
BUN: 27 mg/dL (ref 8–27)
CO2: 23 mmol/L (ref 20–29)
Calcium: 9.4 mg/dL (ref 8.7–10.3)
Chloride: 100 mmol/L (ref 96–106)
Creatinine, Ser: 0.64 mg/dL (ref 0.57–1.00)
Glucose: 152 mg/dL — ABNORMAL HIGH (ref 70–99)
Potassium: 4.1 mmol/L (ref 3.5–5.2)
Sodium: 139 mmol/L (ref 134–144)
eGFR: 91 mL/min/{1.73_m2} (ref >59)

## 2022-10-23 NOTE — Telephone Encounter (Signed)
Pt is requesting a callback regarding her wanting to know if she should take her BP medication along with Baby Asprin and is it okay if she has Afib. Please advise

## 2022-10-23 NOTE — Progress Notes (Signed)
Pt has been made aware of normal result and verbalized understanding.  jw

## 2022-10-23 NOTE — Telephone Encounter (Signed)
Cardiac Catheterization scheduled at [facility] for: Friday October 25, 2022 9 AM Arrival time [facility] Main Entrance A at: 7 AM  Nothing to eat after midnight prior to procedure, clear liquids until 5 AM day of procedure.  Medication instructions: -Hold:  Eliquis-none 10/23/22 until post procedure -Other usual morning medications can be taken with sips of water including aspirin 81 mg.  Confirmed patient has responsible adult to drive home post procedure and be with patient first 24 hours after arriving home.  Plan to go home the same day, you will only stay overnight if medically necessary.  Reviewed procedure instructions with patient.

## 2022-10-23 NOTE — Telephone Encounter (Signed)
Called pt to go over her lab results and she brought up these questions  All questions have been answered.

## 2022-10-25 ENCOUNTER — Other Ambulatory Visit: Payer: Self-pay

## 2022-10-25 ENCOUNTER — Ambulatory Visit (HOSPITAL_COMMUNITY)
Admission: RE | Disposition: A | Discharge status: Home / Self Care | Payer: Self-pay | Source: Home / Self Care | Attending: Cardiology

## 2022-10-25 ENCOUNTER — Ambulatory Visit (HOSPITAL_COMMUNITY)
Admission: RE | Admit: 2022-10-25 | Discharge: 2022-10-25 | Disposition: A | Discharge status: Home / Self Care | Payer: Medicare HMO | Source: Home / Self Care | Attending: Cardiology | Admitting: Cardiology

## 2022-10-25 ENCOUNTER — Ambulatory Visit (HOSPITAL_COMMUNITY): Payer: Medicare HMO

## 2022-10-25 DIAGNOSIS — Z7901 Long term (current) use of anticoagulants: Secondary | ICD-10-CM | POA: Diagnosis not present

## 2022-10-25 DIAGNOSIS — I10 Essential (primary) hypertension: Secondary | ICD-10-CM | POA: Diagnosis not present

## 2022-10-25 DIAGNOSIS — I25118 Atherosclerotic heart disease of native coronary artery with other forms of angina pectoris: Secondary | ICD-10-CM

## 2022-10-25 DIAGNOSIS — I2584 Coronary atherosclerosis due to calcified coronary lesion: Secondary | ICD-10-CM | POA: Insufficient documentation

## 2022-10-25 DIAGNOSIS — F419 Anxiety disorder, unspecified: Secondary | ICD-10-CM | POA: Diagnosis not present

## 2022-10-25 DIAGNOSIS — E7800 Pure hypercholesterolemia, unspecified: Secondary | ICD-10-CM | POA: Diagnosis not present

## 2022-10-25 DIAGNOSIS — F17210 Nicotine dependence, cigarettes, uncomplicated: Secondary | ICD-10-CM | POA: Insufficient documentation

## 2022-10-25 DIAGNOSIS — J449 Chronic obstructive pulmonary disease, unspecified: Secondary | ICD-10-CM | POA: Insufficient documentation

## 2022-10-25 DIAGNOSIS — I480 Paroxysmal atrial fibrillation: Secondary | ICD-10-CM | POA: Diagnosis not present

## 2022-10-25 HISTORY — PX: LEFT HEART CATH AND CORONARY ANGIOGRAPHY: CATH118249

## 2022-10-25 SURGERY — LEFT HEART CATH AND CORONARY ANGIOGRAPHY
Anesthesia: LOCAL

## 2022-10-25 MED ORDER — FENTANYL CITRATE (PF) 100 MCG/2ML IJ SOLN
INTRAMUSCULAR | Status: AC
  Filled 2022-10-25: qty 2

## 2022-10-25 MED ORDER — ACETAMINOPHEN 325 MG PO TABS: 650.0000 mg | ORAL_TABLET | ORAL | Status: DC | PRN

## 2022-10-25 MED ORDER — SODIUM CHLORIDE 0.9% FLUSH: 3.0000 mL | Freq: Two times a day (BID) | INTRAVENOUS | Status: DC

## 2022-10-25 MED ORDER — LIDOCAINE HCL (PF) 1 % IJ SOLN
INTRAMUSCULAR | Status: DC | PRN
  Administered 2022-10-25: 10 mL

## 2022-10-25 MED ORDER — SODIUM CHLORIDE 0.9% FLUSH: 3.0000 mL | INTRAVENOUS | Status: DC | PRN

## 2022-10-25 MED ORDER — SODIUM CHLORIDE 0.9 % IV SOLN: 250.0000 mL | INTRAVENOUS | Status: DC | PRN

## 2022-10-25 MED ORDER — NITROGLYCERIN 1 MG/10 ML FOR IR/CATH LAB
INTRA_ARTERIAL | Status: DC | PRN
  Administered 2022-10-25: 200 ug via INTRACORONARY

## 2022-10-25 MED ORDER — HEPARIN (PORCINE) IN NACL 1000-0.9 UT/500ML-% IV SOLN
INTRAVENOUS | Status: DC | PRN
  Administered 2022-10-25 (×2): 500 mL

## 2022-10-25 MED ORDER — MIDAZOLAM HCL 2 MG/2ML IJ SOLN
INTRAMUSCULAR | Status: DC | PRN
  Administered 2022-10-25: 1 mg via INTRAVENOUS

## 2022-10-25 MED ORDER — ONDANSETRON HCL 4 MG/2ML IJ SOLN: 4.0000 mg | Freq: Four times a day (QID) | INTRAMUSCULAR | Status: DC | PRN

## 2022-10-25 MED ORDER — ASPIRIN 81 MG PO CHEW: 81.0000 mg | CHEWABLE_TABLET | ORAL | Status: DC

## 2022-10-25 MED ORDER — SODIUM CHLORIDE 0.9 % WEIGHT BASED INFUSION: 1.0000 mL/kg/h | INTRAVENOUS | Status: DC

## 2022-10-25 MED ORDER — IOHEXOL 350 MG/ML SOLN
INTRAVENOUS | Status: DC | PRN
  Administered 2022-10-25: 70 mL

## 2022-10-25 MED ORDER — MIDAZOLAM HCL 2 MG/2ML IJ SOLN
INTRAMUSCULAR | Status: AC
  Filled 2022-10-25: qty 2

## 2022-10-25 MED ORDER — ISOSORBIDE MONONITRATE ER 30 MG PO TB24: 30.0000 mg | ORAL_TABLET | Freq: Every day | ORAL | 11 refills | Status: DC

## 2022-10-25 MED ORDER — NITROGLYCERIN 1 MG/10 ML FOR IR/CATH LAB
INTRA_ARTERIAL | Status: AC
  Filled 2022-10-25: qty 10

## 2022-10-25 MED ORDER — VERAPAMIL HCL 2.5 MG/ML IV SOLN
INTRAVENOUS | Status: AC
  Filled 2022-10-25: qty 2

## 2022-10-25 MED ORDER — SODIUM CHLORIDE 0.9 % WEIGHT BASED INFUSION
3.0000 mL/kg/h | INTRAVENOUS | Status: AC
  Administered 2022-10-25: 3 mL/kg/h via INTRAVENOUS

## 2022-10-25 MED ORDER — FENTANYL CITRATE (PF) 100 MCG/2ML IJ SOLN
INTRAMUSCULAR | Status: DC | PRN
  Administered 2022-10-25: 25 ug via INTRAVENOUS

## 2022-10-25 MED ORDER — LIDOCAINE HCL (PF) 1 % IJ SOLN
INTRAMUSCULAR | Status: AC
  Filled 2022-10-25: qty 30

## 2022-10-25 SURGICAL SUPPLY — 10 items
CATH INFINITI 5FR MULTPACK ANG (CATHETERS) IMPLANT
KIT HEART LEFT (KITS) ×2 IMPLANT
KIT MICROPUNCTURE NIT STIFF (SHEATH) IMPLANT
PACK CARDIAC CATHETERIZATION (CUSTOM PROCEDURE TRAY) ×2 IMPLANT
SHEATH PINNACLE 5F 10CM (SHEATH) IMPLANT
SHEATH PROBE COVER 6X72 (BAG) IMPLANT
TRANSDUCER W/STOPCOCK (MISCELLANEOUS) ×2 IMPLANT
TUBING CIL FLEX 10 FLL-RA (TUBING) ×2 IMPLANT
WIRE EMERALD 3MM-J .035X150CM (WIRE) IMPLANT
WIRE MICROINTRODUCER 60CM (WIRE) IMPLANT

## 2022-10-25 NOTE — Discharge Instructions (Addendum)
I would resume Eliquis tomorrow morning if no bleeding issues from cath site

## 2022-10-25 NOTE — Progress Notes (Signed)
Site area- right  Site Prior to Removal- 0   Pressure Applied For-  25 MInutes   Bedrest Beginning at - 1023   Manual- Yes   Patient Status During Pull- Stable    Post Pull Groin Site- 0   Post Pull Instructions Given- Yes   Post Pull Pulses Present- Yes    Dressing Applied- Tegaderm and Gauze Dressing    Comments:  Pressure held for 25 mi. Groin CDI, no hematoma

## 2022-10-25 NOTE — Interval H&P Note (Signed)
History and Physical Interval Note:  10/25/2022 8:17 AM  Kristina Dennis  has presented today for surgery, with the diagnosis of abnormal ct.  The various methods of treatment have been discussed with the patient and family. After consideration of risks, benefits and other options for treatment, the patient has consented to  Procedure(s): LEFT HEART CATH AND CORONARY ANGIOGRAPHY (N/A) as a surgical intervention.  The patient's history has been reviewed, patient examined, no change in status, stable for surgery.  I have reviewed the patient's chart and labs.  Questions were answered to the patient's satisfaction.   Cath Lab Visit (complete for each Cath Lab visit)  Clinical Evaluation Leading to the Procedure:   ACS: No.  Non-ACS:    Anginal Classification: CCS II  Anti-ischemic medical therapy: Minimal Therapy (1 class of medications)  Non-Invasive Test Results: Intermediate-risk stress test findings: cardiac mortality 1-3%/year  Prior CABG: No previous CABG        Theron Arista [facility] 10/25/2022 8:18 AM'

## 2022-10-28 ENCOUNTER — Encounter (HOSPITAL_COMMUNITY): Payer: Self-pay | Admitting: Cardiology

## 2022-10-29 MED FILL — Nitroglycerin IV Soln 100 MCG/ML in D5W: INTRA_ARTERIAL | Qty: 10 | Status: CN

## 2022-10-29 MED FILL — Verapamil HCl IV Soln 2.5 MG/ML: INTRAVENOUS | Qty: 2 | Status: AC

## 2022-11-06 NOTE — Progress Notes (Signed)
Cardiology Office Note:    Date:  11/18/2022   ID:  Kristina, Dennis March 10, 1946, MRN [id number]  PCP:  Elie Confer, NP   [facility] Providers Cardiologist:  Kristeen Miss, MD     Referring MD: Elie Confer, NP   Chief Complaint: CAD  History of Present Illness:    Kristina Dennis is a very pleasant 77 y.o. female with a hx of HTN, coronary artery calcifications, PAF, former tobacco abuse, COPD, and anxiety.   Seen initially by Dr. Elease Hashimoto in 2012, she reestablished care Feb 2020. She was noted to have coronary artery calcifications on chest CT June 2019. At that time, she was taking amlodipine on a PRN basis, about once every week or 2.   Presented to ED on 01/24/21 with sore throat, was walking out of the ED and felt sudden onset of palpiations. Found to be in AF with RVR which resolved spontaneously.She was started on Eliquis 5 mg BID for OAC with CHA2DS2-VASc score of 4.   Last cardiology clinic visit was 01/28/22 with Dr. Elease Hashimoto at which time she reported frustration with diagnosis of AF, "waiting every day to see if she is going to die or have a stroke." She continued to exercise on a regular basis. Maintaining sinus rhythm on metoprolol. Was encouraged to f/u with PCP regarding anxiety.   Seen by me on 04/30/22 for 3 month follow-up. Had pneumonia from October 9 until end of December - it took 3 antibiotics. Celebrated a birthday [birth year], was feeling well at that time. No frequent symptoms of a fib, on one occasion, noted palpitations for approximately 10 minutes. Home BP and HR very well-controlled. Reports one episode of "angina" described as a feeling of grabbing or squeezing pain. Only once, no reoccurrence. Was told in the past that it was angina and she took ntg for a while. Symptoms previously occurred more frequently when she worked in a very stressful environment. Symptom associated with pneumonia was pain in her shins, like shin splints. She does get short  of breath now with vacuuming. No orthopnea, PND, edema, presyncope, and syncope.  Repeat cholesterol panel revealed LDL C of 241 previously 98 in 2020. She was referred to lipid clinic.  Seen by Malena Peer, RPH on 05/09/22 and reported history of statin therapy for 40 years.  She stopped it when she started Eliquis.  Coming off rosuvastatin she noticed all of her joint pains have gone away.  She reported conviction that there is no benefit to cholesterol treatment.  After lengthy discussion she did not wish to start any medication for elevated cholesterol.  Seen by me for follow-up on 09/25/22. She reported one occasion of feeling discomfort that she thinks is a fib. Heart felt "erratic" for about 45 minutes then resolved on its own. Has not occurred again. Going to exercise class 3 x per week. Cannot walk up and down the gym floor without stopping. Gets to one end without difficulty, but cannot get back without resting.  No orthopnea, PND, edema, presyncope, syncope. Is wheezing, has had problems with allergies since January, cough is worse with eating. No bleeding concerns. Lengthy discussion with models about atherosclerosis and heart anatomy.    She underwent coronary CTA 10/18/22 which revealed CAC of 2937, 99th percentile.  She had concern on FFR for flow-limiting lesion in the LAD.  She underwent LHC on 10/25/2022 which revealed single-vessel obstructive CAD involving the ostium of the RCA, lesion severely calcified.  The LAD  and LCx have diffuse moderate nonobstructive disease, also severely calcified.  Recommendation for aggressive medical management. She was started on Imdur by Dr. Swaziland, interventional cardiologist.   Today, she is here for follow-up of CAD. 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 She had a lung CT 11/16/2022 and read on my chart that she has a suspicious lesion.  She is still awaiting a follow-up call.  She denies chest pain, palpitations, DOE. Has not been as physically active recently due to the above issues.    Past Medical History:  Diagnosis Date   Arthritis    Colitis 2003   per path report from 2003: non-specific.  normal colonoscopy in 04/2011.    GERD (gastroesophageal reflux disease)    Hypertension    Irritable bowel syndrome    Osteoporosis    Palpitations    Pneumonia    hx   Rhinitis     Past Surgical History:  Procedure Laterality Date   BUNIONECTOMY Left    COLONOSCOPY     DILATION AND CURETTAGE OF UTERUS     ESOPHAGOGASTRODUODENOSCOPY  2021   KNEE SURGERY Right    arthroscopy   LEFT HEART CATH AND CORONARY ANGIOGRAPHY N/A 10/25/2022   Procedure: LEFT HEART CATH AND CORONARY ANGIOGRAPHY;  Surgeon: Swaziland, Peter M, MD;  Location: [facility] INVASIVE CV LAB;  Service: Cardiovascular;  Laterality: N/A;   ORIF WRIST FRACTURE Left 12/13/2013   Procedure: OPEN REDUCTION INTERNAL FIXATION (ORIF) WRIST FRACTURE;  Surgeon: Cammy Copa, MD;  Location: [facility] OR;  Service: Orthopedics;  Laterality: Left;   TONSILLECTOMY     TUBAL LIGATION      Current Medications: Current Meds  Medication Sig   Ascorbic Acid (VITAMIN C) 1000 MG tablet Take 1,000 mg by mouth.   betamethasone dipropionate 0.05 % cream Apply 1 application  topically daily as needed (Eczema).   Coenzyme Q10 (CO Q 10) 100 MG CAPS Take 100 mg by mouth daily in the afternoon.   ELDERBERRY PO Take 1,000 mg by mouth daily in the afternoon.   ELIQUIS 5 MG TABS tablet TAKE 1 TABLET BY MOUTH TWICE A DAY   Fish Oil-Cholecalciferol (OMEGA-3 FISH OIL-VITAMIN D3) 1200-1000 MG-UNIT CAPS Take 3 capsules by mouth daily.   isosorbide mononitrate (IMDUR) 30 MG 24 hr tablet Take 1 tablet (30 mg total) by mouth daily.   Magnesium 400 MG CAPS Take 400 mg by mouth daily.    metoprolol succinate (TOPROL-XL) 25 MG 24 hr tablet Take 1 tablet (25 mg total) by mouth daily.   Probiotic Product (PROBIOTIC DAILY) CAPS Take 1 capsule by mouth daily.   Propylene Glycol (SYSTANE COMPLETE) 0.6 % SOLN Place 1 drop into both eyes daily as needed (Dry eye).   sodium chloride (OCEAN) 0.65 % SOLN nasal spray Place 1 spray into both nostrils daily as needed for congestion.   [DISCONTINUED] albuterol (VENTOLIN HFA) 108 (90 Base) MCG/ACT inhaler Inhale 2 puffs into the lungs every 6 (six) hours as needed for wheezing or shortness of breath.     Allergies:   Hydrocodone, Amlodipine, Azithromycin, Excedrin extra strength [aspirin-acetaminophen-caffeine], Niacin, Niacin and related, Sulfa antibiotics, Sulfasalazine, and Latex   Social History   Socioeconomic History   Marital status: Married    Spouse name:  Not on file   Number of children: 2   Years of education: Not on file   Highest education level: Not on file  Occupational History   Occupation: Retired  Tobacco Use   Smoking status: Former    Current packs/day: 0.00    Average packs/day: 0.3 packs/day for 50.0 years (12.5 ttl pk-yrs)    Types: Cigarettes    Start date: 48    Quit date: 2020    Years since quitting: 4.5   Smokeless tobacco: Never   Tobacco comments:    Patient smokes 5 cigarettes a day  Vaping Use   Vaping status: Never Used  Substance and Sexual Activity   Alcohol use: No    Alcohol/week: 0.0 standard drinks of alcohol   Drug use: No   Sexual activity: Not on file  Other Topics Concern   Not on file  Social History Narrative   Married to [place]-   2 children   Retired   Former smoker no alcohol tobacco or drug use currently   Social Determinants of Corporate investment banker Strain: Not on file  Food Insecurity: Not on file  Transportation Needs: Not on file  Physical Activity: Not on file  Stress: Not on file  Social Connections: Unknown (09/03/2021)   Received from [employer]    Social Network    Social Network: Not on file     Family History: The patient's family history includes Arrhythmia in her mother; Cancer in her sister; Colon cancer (age of onset: 19) in her sister; Hypertension in her father; Rectal cancer (age of onset: 56) in her sister. There is no history of Pancreatic cancer, Esophageal cancer, or Stomach cancer.  ROS:   Please see the history of present illness.    + cough All other systems reviewed and are negative.  Labs/Other Studies Reviewed:    The following studies were reviewed today:  Lexiscan Myoview 02/06/21   Lexiscan stress is electrcially negative for ischemai   LV perfusion is normal with minimal thinning of the inferior apex.  PResent in both rest and stress images There is no evidence of ischemia. There is no evidence of infarction.  COnsistent with probable soft tissue attenuation (mild)   Left ventricular function is normal. Nuclear stress EF: 76 %.   No change from 2020   Lexiscan stress is electrcially negative for ischemia Myoview scan with normal minimall inferior thinning at apex consistent with probable soft tissue attenuation, no ischemia or scar.   LVEF 76%  Recent Labs: 05/06/2022: ALT 16 10/22/2022: BUN 27; Creatinine, Ser 0.64; Hemoglobin 12.7; Platelets 299; Potassium 4.1; Sodium 139  Recent Lipid Panel    Component Value Date/Time   CHOL 315 (H) 05/06/2022 0740   TRIG 76 05/06/2022 0740   HDL 62 05/06/2022 0740   CHOLHDL 5.1 (H) 05/06/2022 0740   LDLCALC 241 (H) 05/06/2022 0740     Risk Assessment/Calculations:    CHA2DS2-VASc Score = 4   This indicates a 4.8% annual risk of stroke. The patient's score is based upon: CHF History: 0 HTN History: 1 Diabetes History: 0 Stroke History: 0 Vascular Disease History: 0 Age Score: 2 Gender Score: 1    Physical Exam:    VS:  BP 100/60   Pulse 66   Ht 5\' 6"  (1.676 m)   Wt 87 lb 6.4 oz (39.6 kg)   LMP 05/03/1987   SpO2 98%   BMI 14.11 kg/m      Wt Readings from Last 3  Encounters:  11/18/22 87 lb 6.4 oz (39.6 kg)  10/25/22 87 lb 12.8 oz (39.8 kg)  09/25/22 90 lb (40.8 kg)     GEN:  Thin, frail in no acute distress HEENT: Normal NECK: No JVD; No carotid bruits CARDIAC: RRR, no murmurs, rubs, gallops RESPIRATORY:  Clear to auscultation without rales, wheezing or rhonchi  ABDOMEN: Soft, non-tender, non-distended MUSCULOSKELETAL:  No edema; No deformity. 2+  pedal pulses, equal bilaterally SKIN: Warm and dry NEUROLOGIC:  Alert and oriented x 3 PSYCHIATRIC:  Normal affect   EKG:  EKG is not ordered today  Diagnoses:    1. Coronary artery disease involving native coronary artery of native heart without angina pectoris   2. Paroxysmal atrial fibrillation (HCC)   3. Anticoagulant long-term use   4. Pure hypercholesterolemia   5. Coronary artery calcification seen on CT scan   6. Essential hypertension      Assessment and Plan:     CAD without angina: CCTA completed 10/18/22 revealed coronary calcium score 2937 (99th percentile) with possible hemodynamically flow-limiting lesion in the mid LAD, she was sent for cath. LHC 10/25/2022 revealed single-vessel obstructive CAD involving the ostium of the RCA, lesion severely calcified. The LAD and LCx have diffuse moderate nonobstructive disease, also severely calcified. Recommendation for aggressive medical therapy and lipid lowering therapy. Was started on Imdur 30 mg daily. She denies chest pain, dyspnea, or other symptoms concerning for angina. No indication for further ischemic evaluation at this time. Has been hesitant to start lipid-lowering therapy due to history of pain in hands on statin. She is agreeable to start rosuvastatin 5 mg daily.  I advised her to notify us if she does not tolerate this dose. Will likely need to consider addition of bempedoic acid, ezetimibe, or PCSK9i in the future for LDL goal < 55.  Continue GDMT including isosorbide, metoprolol.  She is not on  aspirin due to [facility].   PAF on chronic anticoagulation: HR is well controlled. Rare palpitations. No bleeding concerns. Continue Eliquis 5 mg twice daily which is appropriate dose for stroke prevention for CHA2DS2-VASc score of 4. Continue metoprolol for rate control.    Hypertension: BP is well controlled. She is tolerating Imdur and metoprolol without concerning symptoms.   Hyperlipidemia LDL goal < 55: LDL-C 241 on 05/06/22. Previously intolerant of statins. Agreeable to try rosuvastatin 5 mg daily. Will likely need to consider addition of bempedoic acid, ezetimibe, or PCSK9i in the future for LDL goal < 55.  Will recheck lipid and ALT in 2 to 3 months.    Disposition: Keep your December appointment with Dr. Elease Hashimoto    Medication Adjustments/Labs and Tests Ordered: Current medicines are reviewed at length with the patient today.  Concerns regarding medicines are outlined above.  No orders of the defined types were placed in this encounter.  No orders of the defined types were placed in this encounter.   There are no Patient Instructions on file for this visit.   Signed, Levi Aland, NP  11/18/2022 3:41 PM    [facility]

## 2022-11-07 ENCOUNTER — Ambulatory Visit
Admission: RE | Admit: 2022-11-07 | Discharge: 2022-11-07 | Disposition: A | Discharge status: Home / Self Care | Payer: Medicare HMO | Source: Ambulatory Visit | Attending: Emergency Medicine | Admitting: Emergency Medicine

## 2022-11-07 DIAGNOSIS — R911 Solitary pulmonary nodule: Secondary | ICD-10-CM

## 2022-11-08 ENCOUNTER — Other Ambulatory Visit: Payer: Self-pay | Admitting: Cardiovascular Disease

## 2022-11-08 DIAGNOSIS — I480 Paroxysmal atrial fibrillation: Secondary | ICD-10-CM

## 2022-11-08 NOTE — Telephone Encounter (Signed)
Prescription refill request for Eliquis received. Indication:afib Last office visit:6/24 Scr:0.64  7/24 Age: 77 Weight:39.8  kg  Prescription refilled

## 2022-11-14 ENCOUNTER — Telehealth: Payer: Self-pay | Admitting: Primary Care

## 2022-11-14 NOTE — Telephone Encounter (Signed)
Patient is calling to get her CT results.  Please call patient to discuss.  She stated it has been a few weeks since the test.  CB# [phone]

## 2022-11-15 ENCOUNTER — Ambulatory Visit (HOSPITAL_COMMUNITY): Payer: Medicare HMO | Attending: Nurse Practitioner

## 2022-11-15 DIAGNOSIS — E7800 Pure hypercholesterolemia, unspecified: Secondary | ICD-10-CM

## 2022-11-15 DIAGNOSIS — I2510 Atherosclerotic heart disease of native coronary artery without angina pectoris: Secondary | ICD-10-CM

## 2022-11-15 DIAGNOSIS — I480 Paroxysmal atrial fibrillation: Secondary | ICD-10-CM | POA: Diagnosis not present

## 2022-11-18 ENCOUNTER — Encounter: Payer: Self-pay | Admitting: Nurse Practitioner

## 2022-11-18 ENCOUNTER — Ambulatory Visit: Payer: Medicare HMO | Attending: Nurse Practitioner | Admitting: Nurse Practitioner

## 2022-11-18 VITALS — BP 100/60 | HR 66 | Ht 66.0 in | Wt 87.4 lb

## 2022-11-18 DIAGNOSIS — I2510 Atherosclerotic heart disease of native coronary artery without angina pectoris: Secondary | ICD-10-CM

## 2022-11-18 DIAGNOSIS — I480 Paroxysmal atrial fibrillation: Secondary | ICD-10-CM | POA: Diagnosis not present

## 2022-11-18 DIAGNOSIS — I10 Essential (primary) hypertension: Secondary | ICD-10-CM

## 2022-11-18 DIAGNOSIS — Z7901 Long term (current) use of anticoagulants: Secondary | ICD-10-CM | POA: Diagnosis not present

## 2022-11-18 DIAGNOSIS — E7800 Pure hypercholesterolemia, unspecified: Secondary | ICD-10-CM | POA: Diagnosis not present

## 2022-11-18 MED ORDER — ROSUVASTATIN CALCIUM 5 MG PO TABS: 5.0000 mg | ORAL_TABLET | Freq: Every day | ORAL | 11 refills | Status: DC

## 2022-11-18 NOTE — Telephone Encounter (Signed)
CT was on 11/07/2022.

## 2022-11-18 NOTE — Patient Instructions (Signed)
Medication Instructions:   START Rosuvastatin one (1) tablet by mouth ( 5 mg) every evening.   *If you need a refill on your cardiac medications before your next appointment, please call your pharmacy*   Lab Work:  Your physician recommends that you return for a FASTING lipid profile/alt. On Wednesday, October 9. You can come in on the day of your appointment anytime between 7:30-4:30 fasting from midnight the night before.     If you have labs (blood work) drawn today and your tests are completely normal, you will receive your results only by: MyChart Message (if you have MyChart) OR A paper copy in the mail If you have any lab test that is abnormal or we need to change your treatment, we will call you to review the results.   Testing/Procedures:  None ordered.   Follow-Up: At [facility], you and your health needs are our priority.  As part of our continuing mission to provide you with exceptional heart care, we have created designated Provider Care Teams.  These Care Teams include your primary Cardiologist (physician) and Advanced Practice Providers (APPs -  Physician Assistants and Nurse Practitioners) who all work together to provide you with the care you need, when you need it.  We recommend signing up for the patient portal called "MyChart".  Sign up information is provided on this After Visit Summary.  MyChart is used to connect with patients for Virtual Visits (Telemedicine).  Patients are able to view lab/test results, encounter notes, upcoming appointments, etc.  Non-urgent messages can be sent to your provider as well.   To learn more about what you can do with MyChart, go to [email or web address].    Your next appointment:   5 month(s)  Provider:   Kristeen Miss, MD

## 2022-11-19 NOTE — Telephone Encounter (Signed)
I reviewed her CT scan of the chest with her.  There is an area surrounding some bullous change in the right upper lobe that is new compared with priors, slight spiculation.  Unclear whether this represents active inflammation, scar.  She needs a repeat CT scan of her chest in 3 months (late October).  She also describes some persistent chronic cough, chest discomfort.  Please set her up with an acute OV to see either RB or an APP to review.

## 2022-11-20 NOTE — Telephone Encounter (Signed)
ATC patient. Left message on VM and requested patient call back to confirm she is aware of scheduled OV with Tammy,NP.

## 2022-11-20 NOTE — Telephone Encounter (Signed)
ATC X1 LVM for patient. I have scheduled her for OV tomorrow with Tammy at 11am to discuss symptoms. Please advise patient of apt

## 2022-11-21 ENCOUNTER — Encounter: Payer: Self-pay | Admitting: Adult Health

## 2022-11-21 ENCOUNTER — Ambulatory Visit: Payer: Medicare HMO | Admitting: Adult Health

## 2022-11-21 VITALS — BP 92/60 | HR 64 | Temp 87.2°F | Ht 66.0 in | Wt 87.2 lb

## 2022-11-21 DIAGNOSIS — J441 Chronic obstructive pulmonary disease with (acute) exacerbation: Secondary | ICD-10-CM

## 2022-11-21 DIAGNOSIS — R911 Solitary pulmonary nodule: Secondary | ICD-10-CM

## 2022-11-21 MED ORDER — DOXYCYCLINE HYCLATE 100 MG PO TABS
100.0000 mg | ORAL_TABLET | Freq: Two times a day (BID) | ORAL | 0 refills | Status: DC
Start: 2022-11-21 — End: 2023-03-21

## 2022-11-21 NOTE — Progress Notes (Signed)
@Patient  ID: Kristina Dennis, female    DOB: 06-18-1945, 77 y.o.   MRN: [id number]  Chief Complaint  Patient presents with   Acute Visit    Referring provider: Elie Confer, NP  HPI: 77 year old female former smoker followed for COPD with emphysema  TEST/EVENTS :  CT chest November 07, 2022 showed emphysema, new 1.1 cm spiculated lesion in the right upper lobe.  Biapical pleural-parenchymal scarring  Pulmonary function testing today showed no evidence of obstructive lung disease/ FEV1 82% predicted.   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  11/21/2022 Acute OV : COPD  Presents for an acute office visit.  Complains over the last 3 weeks that her breathing has not been doing as well.  She has had some increased cough with thick mucus.  Hard to get up at times.  Also some pleuritic pain especially in the right lower ribs.  She denies any fever, hemoptysis or exertional chest pain.  She has been undergoing a cardiac workup with results as above.  Patient had a CT chest on November 07, 2022 showed Emphysema, new 1.1cm spiculated lesion in RUL. Has a serial CT planned for late October to follow this lung lesion. She has a albuterol inhaler which she says she never uses.  She denies is any wheezing.  No hemoptysis or leg swelling.     Allergies  Allergen Reactions   Hydrocodone Nausea And Vomiting   Amlodipine     Severe all over joint pain   Azithromycin Nausea Only   Excedrin Extra Strength [Aspirin-Acetaminophen-Caffeine] Nausea Only    Extreme nausea   Niacin Nausea And Vomiting   Niacin And Related Nausea And Vomiting   Sulfa Antibiotics Nausea And Vomiting and Other (See Comments)    Violently ill   Sulfasalazine Nausea  And Vomiting    Violently ill   Latex Rash    Immunization History  Administered Date(s) Administered   Influenza Inj Mdck Quad With Preservative 04/29/2016   Influenza, High Dose Seasonal PF 01/19/2018   Influenza-Unspecified 03/10/2017   Pneumococcal Polysaccharide-23 03/08/2017, 01/19/2018   Td 10/15/2017   Zoster Recombinant(Shingrix) 02/04/2018, 02/05/2018    Past Medical History:  Diagnosis Date   Arthritis    Colitis 2003   per path report from 2003: non-specific.  normal colonoscopy in 04/2011.    GERD (gastroesophageal reflux disease)    Hypertension    Irritable bowel syndrome    Osteoporosis    Palpitations    Pneumonia    hx   Rhinitis     Tobacco History: Social History   Tobacco Use  Smoking Status Former   Current packs/day: 0.00   Average packs/day: 0.3 packs/day for 50.0 years (12.5 ttl pk-yrs)   Types: Cigarettes   Start date: 34   Quit date: 2020   Years since quitting: 4.5  Smokeless Tobacco Never  Tobacco Comments   Patient smokes 5 cigarettes a day   Counseling given: Not Answered Tobacco comments: Patient smokes 5 cigarettes a day   Outpatient Medications Prior to Visit  Medication Sig Dispense Refill   Ascorbic Acid (VITAMIN C) 1000 MG tablet Take 1,000 mg by mouth.     betamethasone dipropionate 0.05 % cream Apply 1 application  topically daily as needed (Eczema).  Coenzyme Q10 (CO Q 10) 100 MG CAPS Take 100 mg by mouth daily in the afternoon.     ELDERBERRY PO Take 1,000 mg by mouth daily in the afternoon.     ELIQUIS 5 MG TABS tablet TAKE 1 TABLET BY MOUTH TWICE A DAY 60 tablet 5   Fish Oil-Cholecalciferol (OMEGA-3 FISH OIL-VITAMIN D3) 1200-1000 MG-UNIT CAPS Take 3 capsules by mouth daily.     isosorbide mononitrate (IMDUR) 30 MG 24 hr tablet Take 1 tablet (30 mg total) by mouth daily. 30 tablet 11   Magnesium 400 MG CAPS Take 400 mg by mouth daily.     metoprolol succinate (TOPROL-XL) 25 MG 24 hr tablet Take 1 tablet (25 mg  total) by mouth daily. 90 tablet 3   Probiotic Product (PROBIOTIC DAILY) CAPS Take 1 capsule by mouth daily.     Propylene Glycol (SYSTANE COMPLETE) 0.6 % SOLN Place 1 drop into both eyes daily as needed (Dry eye).     rosuvastatin (CRESTOR) 5 MG tablet Take 1 tablet (5 mg total) by mouth daily. 30 tablet 11   sodium chloride (OCEAN) 0.65 % SOLN nasal spray Place 1 spray into both nostrils daily as needed for congestion.     No facility-administered medications prior to visit.     Review of Systems:   Constitutional:   No  weight loss, night sweats,  Fevers, chills,  +fatigue, or  lassitude.  HEENT:   No headaches,  Difficulty swallowing,  Tooth/dental problems, or  Sore throat,                No sneezing, itching, ear ache, nasal congestion, post nasal drip,   CV:  No chest pain,  Orthopnea, PND, swelling in lower extremities, anasarca, dizziness, palpitations, syncope.   GI  No heartburn, indigestion, abdominal pain, nausea, vomiting, diarrhea, change in bowel habits, loss of appetite, bloody stools.   Resp: .  No chest wall deformity  Skin: no rash or lesions.  GU: no dysuria, change in color of urine, no urgency or frequency.  No flank pain, no hematuria   MS:  No joint pain or swelling.  No decreased range of motion.  No back pain.    Physical Exam  BP 92/60 (BP Location: Left Arm, Cuff Size: Small)   Pulse 64   Temp (!) 87.2 F (30.7 C) (Oral)   Ht 5\' 6"  (1.676 m)   Wt 87 lb 3.2 oz (39.6 kg)   LMP 05/03/1987   SpO2 96%   BMI 14.07 kg/m   GEN: A/Ox3; pleasant , NAD, thin and frail   HEENT:  [place]/AT,  NOSE-clear, THROAT-clear, no lesions, no postnasal drip or exudate noted.   NECK:  Supple w/ fair ROM; no JVD; normal carotid impulses w/o bruits; no thyromegaly or nodules palpated; no lymphadenopathy.    RESP  Clear  P & A; w/o, wheezes/ rales/ or rhonchi. no accessory muscle use, no dullness to percussion  CARD:  RRR, no m/r/g, no peripheral edema, pulses  intact, no cyanosis or clubbing.  GI:   Soft & nt; nml bowel sounds; no organomegaly or masses detected.   Musco: Warm bil, no deformities or joint swelling noted.   Neuro: alert, no focal deficits noted.    Skin: Warm, no lesions or rashes    Lab Results:   BNP No results found for: "BNP"  ProBNP No results found for: "PROBNP"  Imaging: CT Chest Wo Contrast  Result Date: 11/16/2022 CLINICAL DATA:  Abnormal xray - lung  opacity/opacities right upper lobe opacity EXAM: CT CHEST WITHOUT CONTRAST TECHNIQUE: Multidetector CT imaging of the chest was performed following the standard protocol without IV contrast. RADIATION DOSE REDUCTION: This exam was performed according to the departmental dose-optimization program which includes automated exposure control, adjustment of the mA and/or kV according to patient size and/or use of iterative reconstruction technique. COMPARISON:  10/18/2022, 10/15/2017 FINDINGS: Cardiovascular: Heart size normal. No pericardial effusion. Extensive coronary calcifications. Scattered calcified atheromatous plaque in the aortic arch and descending thoracic aorta without aneurysm. Mediastinum/Nodes: No mass or adenopathy. Lungs/Pleura: No pleural effusion. No pneumothorax. Pulmonary emphysema. New 1.1 cm spiculated lesion in the lateral right upper lobe (Im47,Se5) new since 10/15/2017. Biapical pleuroparenchymal scarring, partially calcified. Scattered small calcified granulomas in the right lower lobe. Upper Abdomen: No adrenal mass. Left nephrolithiasis without hydronephrosis. Musculoskeletal: No chest wall mass or suspicious bone lesions identified. IMPRESSION: New 1.1 cm spiculated lesion in the lateral right upper lobe, suspicious for malignancy. Consider one of the following in 3 months for both low-risk and high-risk individuals: (a) repeat chest CT, (b) follow-up PET-CT, or (c) tissue sampling. This recommendation follows the consensus statement: Guidelines for  Management of Incidental Pulmonary Nodules Detected on CT Images: From the Fleischner Society 2017; Radiology 2017; 284:228-243. Aortic Atherosclerosis (ICD10-I70.0) and Emphysema (ICD10-J43.9). Electronically Signed   By: Corlis Leak M.D.   On: 11/16/2022 11:32   ECHOCARDIOGRAM COMPLETE  Result Date: 11/16/2022    ECHOCARDIOGRAM REPORT   Patient Name:   EMBERLY BAINUM Date of Exam: 11/15/2022 Medical Rec #:  [id number]        Height:       66.0 in Accession #:    [id number]       Weight:       87.8 lb Date of Birth:  1945/10/20       BSA:          1.409 m Patient Age:    76 years         BP:           102/70 mmHg Patient Gender: F                HR:           63 bpm. Exam Location:  [address] Procedure: 2D Echo, Cardiac Doppler, Color Doppler and Strain Analysis Indications:    I48.91 Atrial fibrillation  History:        Patient has no prior history of Echocardiogram examinations.                 CAD, COPD, Arrythmias:Atrial Fibrillation; Risk                 Factors:Hypertension, Dyslipidemia and Former Smoker.  Sonographer:    Samule Ohm RDCS Referring Phys: [id number] Zachary George SWINYER IMPRESSIONS  1. Left ventricular ejection fraction, by estimation, is 55 to 60%. The left ventricle has normal function. The left ventricle has no regional wall motion abnormalities. Left ventricular diastolic parameters are consistent with Grade I diastolic dysfunction (impaired relaxation). The average left ventricular global longitudinal strain is -17.0 %. The global longitudinal strain is normal.  2. Right ventricular systolic function is normal. The right ventricular size is normal.  3. The mitral valve is normal in structure. Trivial mitral valve regurgitation. No evidence of mitral stenosis.  4. The aortic valve is tricuspid. There is mild calcification of the aortic valve. There is mild thickening of the aortic valve. Aortic valve regurgitation is not visualized. No  aortic stenosis is present.  5. The inferior vena  cava is normal in size with greater than 50% respiratory variability, suggesting right atrial pressure of 3 mmHg. FINDINGS  Left Ventricle: Left ventricular ejection fraction, by estimation, is 55 to 60%. The left ventricle has normal function. The left ventricle has no regional wall motion abnormalities. The average left ventricular global longitudinal strain is -17.0 %. The global longitudinal strain is normal. The left ventricular internal cavity size was normal in size. There is no left ventricular hypertrophy. Left ventricular diastolic parameters are consistent with Grade I diastolic dysfunction (impaired relaxation). Indeterminate filling pressures. Right Ventricle: The right ventricular size is normal. No increase in right ventricular wall thickness. Right ventricular systolic function is normal. Left Atrium: Left atrial size was normal in size. Right Atrium: Right atrial size was normal in size. Pericardium: There is no evidence of pericardial effusion. Mitral Valve: The mitral valve is normal in structure. Trivial mitral valve regurgitation. No evidence of mitral valve stenosis. Tricuspid Valve: The tricuspid valve is normal in structure. Tricuspid valve regurgitation is trivial. No evidence of tricuspid stenosis. Aortic Valve: The aortic valve is tricuspid. There is mild calcification of the aortic valve. There is mild thickening of the aortic valve. Aortic valve regurgitation is not visualized. No aortic stenosis is present. Pulmonic Valve: The pulmonic valve was normal in structure. Pulmonic valve regurgitation is not visualized. No evidence of pulmonic stenosis. Aorta: The aortic root is normal in size and structure. Venous: The inferior vena cava is normal in size with greater than 50% respiratory variability, suggesting right atrial pressure of 3 mmHg. IAS/Shunts: No atrial level shunt detected by color flow Doppler.  LEFT VENTRICLE PLAX 2D LVIDd:         2.90 cm   Diastology LVIDs:         2.10 cm    LV e' medial:    7.62 cm/s LV PW:         0.80 cm   LV E/e' medial:  10.0 LV IVS:        0.90 cm   LV e' lateral:   7.51 cm/s LVOT diam:     1.90 cm   LV E/e' lateral: 10.1 LV SV:         68 LV SV Index:   48        2D Longitudinal Strain LVOT Area:     2.84 cm  2D Strain GLS (A2C):   -18.5 %                          2D Strain GLS (A3C):   -15.3 %                          2D Strain GLS (A4C):   -17.1 %                          2D Strain GLS Avg:     -17.0 % RIGHT VENTRICLE             IVC RV S prime:     12.80 cm/s  IVC diam: 1.50 cm TAPSE (M-mode): 2.1 cm LEFT ATRIUM             Index        RIGHT ATRIUM           Index LA diam:  2.50 cm 1.77 cm/m   RA Pressure: 3.00 mmHg LA Vol (A2C):   31.3 ml 22.21 ml/m  RA Area:     9.46 cm LA Vol (A4C):   37.2 ml 26.39 ml/m  RA Volume:   19.10 ml  13.55 ml/m LA Biplane Vol: 36.2 ml 25.68 ml/m  AORTIC VALVE LVOT Vmax:   103.00 cm/s LVOT Vmean:  72.600 cm/s LVOT VTI:    0.240 m  AORTA Ao Root diam: 3.00 cm Ao Asc diam:  3.20 cm MITRAL VALVE               TRICUSPID VALVE MV Area (PHT): 2.53 cm    Estimated RAP:  3.00 mmHg MV Decel Time: 300 msec MV E velocity: 76.00 cm/s  SHUNTS MV A velocity: 90.10 cm/s  Systemic VTI:  0.24 m MV E/A ratio:  0.84        Systemic Diam: 1.90 cm Chilton Si MD Electronically signed by Chilton Si MD Signature Date/Time: 11/16/2022/10:02:47 AM    Final    CARDIAC CATHETERIZATION  Result Date: 10/25/2022   Dist LAD lesion is 75% stenosed.   Prox LAD to Mid LAD lesion is 40% stenosed.   Mid LAD lesion is 60% stenosed.   Mid Cx to Dist Cx lesion is 60% stenosed.   Ost RCA lesion is 85% stenosed.   Prox RCA lesion is 30% stenosed.   The left ventricular systolic function is normal.   LV end diastolic pressure is normal.   The left ventricular ejection fraction is 55-65% by visual estimate. Single vessel obstructive CAD involving the ostium of the RCA. This lesion is severely calcified. The LAD and LCx have diffuse moderate  nonobstructive disease - also severely calcified. Normal LV function Normal LVEDP Plan: recommend aggressive medical therapy and lipid lowering therapy. If patient remains symptomatic on optimal medical therapy could consider PCI of the ostial RCA. This is a complex aorto-ostial lesion and would require atherectomy. She would require antiplatelet therapy in addition to her Eliquis. Given female sex and very small body habitus she would be at higher bleeding risk and location of lesion would have higher restenosis risk.    Administration History     None          Latest Ref Rng & Units 05/20/2022    2:36 PM  PFT Results  FVC-Pre L 2.36   FVC-Predicted Pre % 79   FVC-Post L 2.39   FVC-Predicted Post % 80   Pre FEV1/FVC % % 74   Post FEV1/FCV % % 77   FEV1-Pre L 1.76   FEV1-Predicted Pre % 78   FEV1-Post L 1.84   DLCO uncorrected ml/min/mmHg 9.10   DLCO UNC% % 44   DLCO corrected ml/min/mmHg 9.10   DLCO COR %Predicted % 44   DLVA Predicted % 55   TLC L 5.35   TLC % Predicted % 99   RV % Predicted % 128     No results found for: "NITRICOXIDE"      Assessment & Plan:   COPD (chronic obstructive pulmonary disease) (HCC) Predominantly emphysema. -Suspect she is having a COPD/bronchitic flare. Hold on maintenance inhaler at this time.  Recent CT chest showed a new right upper lobe nodule.  Has a follow-up CT chest.  No sign of acute infection  Plan  Patient Instructions  Doxycycline 100mg  Twice daily  for 1 week, take with food.  Liquid Mucinex DM or Robitussin DM As needed  cough/congestion  Albuterol inhaler As needed  Activity as tolerated.  CT Chest in October as planned.  Follow up with Dr. Delton Coombes  in October after CT scan.  Please contact office for sooner follow up if symptoms do not improve or worsen or seek emergency care      Lung nodule 1.1 spiculated lesion in the right upper lobe suspicious Follow-up CT in October as planned     Rubye Oaks,  NP 11/21/2022

## 2022-11-21 NOTE — Patient Instructions (Signed)
Doxycycline 100mg  Twice daily  for 1 week, take with food.  Liquid Mucinex DM or Robitussin DM As needed  cough/congestion  Albuterol inhaler As needed   Activity as tolerated.  CT Chest in October as planned.  Follow up with Dr. Delton Coombes  in October after CT scan.  Please contact office for sooner follow up if symptoms do not improve or worsen or seek emergency care

## 2022-11-21 NOTE — Assessment & Plan Note (Signed)
1.1 spiculated lesion in the right upper lobe suspicious Follow-up CT in October as planned

## 2022-11-21 NOTE — Assessment & Plan Note (Signed)
Predominantly emphysema. -Suspect she is having a COPD/bronchitic flare. Hold on maintenance inhaler at this time.  Recent CT chest showed a new right upper lobe nodule.  Has a follow-up CT chest.  No sign of acute infection  Plan  Patient Instructions  Doxycycline 100mg  Twice daily  for 1 week, take with food.  Liquid Mucinex DM or Robitussin DM As needed  cough/congestion  Albuterol inhaler As needed   Activity as tolerated.  CT Chest in October as planned.  Follow up with Dr. Delton Coombes  in October after CT scan.  Please contact office for sooner follow up if symptoms do not improve or worsen or seek emergency care

## 2022-11-22 NOTE — Telephone Encounter (Signed)
Closing encounter since patient was seen by TP yesterday

## 2023-01-29 ENCOUNTER — Ambulatory Visit: Payer: Medicare HMO | Attending: Nurse Practitioner

## 2023-01-29 DIAGNOSIS — I480 Paroxysmal atrial fibrillation: Secondary | ICD-10-CM

## 2023-01-29 DIAGNOSIS — E7800 Pure hypercholesterolemia, unspecified: Secondary | ICD-10-CM

## 2023-01-29 DIAGNOSIS — I10 Essential (primary) hypertension: Secondary | ICD-10-CM

## 2023-01-29 DIAGNOSIS — Z7901 Long term (current) use of anticoagulants: Secondary | ICD-10-CM

## 2023-01-29 DIAGNOSIS — I2510 Atherosclerotic heart disease of native coronary artery without angina pectoris: Secondary | ICD-10-CM

## 2023-01-30 LAB — LIPID PANEL
Chol/HDL Ratio: 3.5 {ratio} (ref 0.0–4.4)
Cholesterol, Total: 194 mg/dL (ref 100–199)
HDL: 55 mg/dL (ref >39)
LDL Chol Calc (NIH): 123 mg/dL — ABNORMAL HIGH (ref 0–99)
Triglycerides: 88 mg/dL (ref 0–149)
VLDL Cholesterol Cal: 16 mg/dL (ref 5–40)

## 2023-01-30 LAB — ALT: ALT: 18 [IU]/L (ref 0–32)

## 2023-01-31 ENCOUNTER — Telehealth: Payer: Self-pay | Admitting: Nurse Practitioner

## 2023-01-31 ENCOUNTER — Other Ambulatory Visit (HOSPITAL_COMMUNITY): Payer: Self-pay

## 2023-01-31 NOTE — Telephone Encounter (Signed)
Patient is interested in checking price of Nexletol. Could we please see what her cost would be? She has significant coronary disease and can only tolerate rosuvastatin at 5 mg.

## 2023-02-03 ENCOUNTER — Telehealth: Payer: Self-pay | Admitting: Pharmacy Technician

## 2023-02-03 ENCOUNTER — Other Ambulatory Visit: Payer: Self-pay | Admitting: *Deleted

## 2023-02-03 ENCOUNTER — Other Ambulatory Visit (HOSPITAL_COMMUNITY): Payer: Self-pay

## 2023-02-03 NOTE — Telephone Encounter (Signed)
Please notify patient and see if Nexletol will be financially feasible for her. The approval was completed through [facility], we can transfer the Rx if she would like for [place] to, cost may vary from different pharmacy.

## 2023-02-03 NOTE — Telephone Encounter (Signed)
S/w pt this copay is not feasible. Discussed PCSK9 pt does not want to do this either. Pt would like to increase crestor.  Will send to [facility] to advise.

## 2023-02-03 NOTE — Telephone Encounter (Signed)
I would like her to increase rosuvastatin to 10 mg daily. She can get repeat lipid in 2-3 months, could be same day as appointment with NA.

## 2023-02-03 NOTE — Telephone Encounter (Signed)
Pharmacy Patient Advocate Encounter  Received notification from [facility] that Prior Authorization for nexletol has been APPROVED from 04/22/22 to 04/21/24. Ran test claim, Copay is $95.92. This test claim was processed through [facility]- copay amounts may vary at other pharmacies due to pharmacy/plan contracts, or as the patient moves through the different stages of their insurance plan.   PA #/Case ID/Reference #: [id number]

## 2023-02-07 ENCOUNTER — Other Ambulatory Visit: Payer: Self-pay | Admitting: *Deleted

## 2023-02-07 MED ORDER — ROSUVASTATIN CALCIUM 10 MG PO TABS: 10.0000 mg | ORAL_TABLET | Freq: Every day | ORAL | Status: DC

## 2023-02-07 NOTE — Telephone Encounter (Signed)
S/w pt is aware of recommendations. Pt will take two (5 mg ) tablets at the same time.  Dose not need refill at this time and dose increase added to pharmacy notes for next refill. Fasting labs added to Upcoming appt notes in December, pt aware to be fasting.

## 2023-02-07 NOTE — Telephone Encounter (Signed)
Lmtcb to go over new recommendations.

## 2023-03-04 ENCOUNTER — Encounter: Payer: Self-pay | Admitting: Orthopaedic Surgery

## 2023-03-04 ENCOUNTER — Ambulatory Visit: Payer: Medicare HMO | Admitting: Orthopaedic Surgery

## 2023-03-04 DIAGNOSIS — M7670 Peroneal tendinitis, unspecified leg: Secondary | ICD-10-CM | POA: Insufficient documentation

## 2023-03-04 NOTE — Progress Notes (Signed)
Office Visit Note   Patient: Kristina Dennis           Date of Birth: [birth year]           MRN: [id number] Visit Date: 03/04/2023              Requested by: Elie Confer, NP [address] [place],  [place] [address] PCP: Elie Confer, NP   Assessment & Plan: Visit Diagnoses:  1. Peroneal tendonitis, unspecified laterality     Plan: Patient with bilateral peroneal brevis tendinopathy.  Will place her lace up Swede-O.  I gave her some information about recommended order for the opposite ankle or brace that may be easier for her to apply for several weeks.  She can return if she has ongoing problems.  I reviewed x-rays with her and she does not have a fracture.  Follow-Up Instructions: Return if symptoms worsen or fail to improve.   Orders:  No orders of the defined types were placed in this encounter.  No orders of the defined types were placed in this encounter.     Procedures: No procedures performed   Clinical Data: No additional findings.   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  She is walking across the threshold in the kitchen and suddenly felt sharp pain in her foot.  She is placed in a short cam boot but states it is putting pressure against her accessory navicular medial and is very painful.  Additionally past history of COPD atrial fibs hyperlipidemia.  Patient was a Publishing rights manager.  Review of Systems all systems noncontributory to HPI.   Objective: Vital Signs: LMP 05/03/1987   Physical Exam Constitutional:      Appearance: She is well-developed.  HENT:     Head: Normocephalic.     Right Ear: External ear normal.     Left Ear: External ear normal. There is no impacted cerumen.   Eyes:     Pupils: Pupils are equal, round, and reactive to light.  Neck:     Thyroid: No thyromegaly.     Trachea: No tracheal deviation.  Cardiovascular:     Rate and Rhythm: Normal rate.  Pulmonary:     Effort: Pulmonary effort is normal.  Abdominal:     Palpations: Abdomen is soft.  Musculoskeletal:     Cervical back: No rigidity.  Skin:    General: Skin is warm and dry.  Neurological:     Mental Status: She is alert and oriented to person, place, and time.  Psychiatric:        Behavior: Behavior normal.     Ortho Exam patient has intact peroneal tendons but exquisite tenderness palpation of both the peroneal tendons from the fibula tip to the fifth metatarsal insertion site.  No ecchymosis no tenderness over the proximal fifth metatarsal.  Specialty Comments:  No specialty comments available.  Imaging: No results found.   PMFS History: Patient Active Problem List   Diagnosis Date Noted   Peroneal tendonitis, unspecified laterality 03/04/2023   Hyperlipidemia 05/09/2022   CAP (community acquired pneumonia) 03/25/2022   Lung nodule 03/25/2022   Atrial fibrillation (HCC) 01/30/2021   Left arm pain 01/30/2021  Chronic cough 08/23/2020   COPD (chronic obstructive pulmonary disease) (HCC) 08/23/2020   Contusion of wrist 11/04/2018   Pain in wrist 11/04/2018   Open fracture of nasal bones 10/22/2017   Acute diffuse otitis externa of right ear 05/06/2017   Eustachian tube dysfunction, bilateral 05/06/2017   Excessive drinking of alcohol 06/18/2014   Partial small bowel obstruction (HCC) 06/16/2014   Diarrhea 06/16/2014   Urethral spasm 06/16/2014   Palpitations 04/02/2011   Past Medical History:  Diagnosis Date   Arthritis    Colitis 2003   per path report from 2003: non-specific.  normal colonoscopy in 04/2011.    GERD (gastroesophageal reflux disease)    Hypertension    Irritable bowel syndrome    Osteoporosis    Palpitations    Pneumonia    hx    Rhinitis     Family History  Problem Relation Age of Onset   Hypertension Father    Arrhythmia Mother    Cancer Sister        rectal    Rectal cancer Sister 33   Colon cancer Sister 69   Pancreatic cancer Neg Hx    Esophageal cancer Neg Hx    Stomach cancer Neg Hx     Past Surgical History:  Procedure Laterality Date   BUNIONECTOMY Left    COLONOSCOPY     DILATION AND CURETTAGE OF UTERUS     ESOPHAGOGASTRODUODENOSCOPY  2021   KNEE SURGERY Right    arthroscopy   LEFT HEART CATH AND CORONARY ANGIOGRAPHY N/A 10/25/2022   Procedure: LEFT HEART CATH AND CORONARY ANGIOGRAPHY;  Surgeon: [place], Peter M, MD;  Location: [facility];  Service: Cardiovascular;  Laterality: N/A;   ORIF WRIST FRACTURE Left 12/13/2013   Procedure: OPEN REDUCTION INTERNAL FIXATION (ORIF) WRIST FRACTURE;  Surgeon: Cammy Copa, MD;  Location: [facility] OR;  Service: Orthopedics;  Laterality: Left;   TONSILLECTOMY     TUBAL LIGATION     Social History   Occupational History   Occupation: Retired  Tobacco Use   Smoking status: Former    Current packs/day: 0.00    Average packs/day: 0.3 packs/day for 50.0 years (12.5 ttl pk-yrs)    Types: Cigarettes    Start date: 18    Quit date: 2020    Years since quitting: 4.8   Smokeless tobacco: Never   Tobacco comments:    Patient smokes 5 cigarettes a day  Vaping Use   Vaping status: Never Used  Substance and Sexual Activity   Alcohol use: No    Alcohol/week: 0.0 standard drinks of alcohol   Drug use: No   Sexual activity: Not on file

## 2023-03-21 ENCOUNTER — Emergency Department (HOSPITAL_COMMUNITY): Payer: Medicare HMO

## 2023-03-21 ENCOUNTER — Encounter (HOSPITAL_COMMUNITY): Payer: Self-pay

## 2023-03-21 ENCOUNTER — Inpatient Hospital Stay (HOSPITAL_COMMUNITY): Payer: Medicare HMO

## 2023-03-21 ENCOUNTER — Observation Stay (HOSPITAL_COMMUNITY)
Admission: EM | Admit: 2023-03-21 | Discharge: 2023-03-22 | Disposition: A | Discharge status: Home / Self Care | Payer: Medicare HMO | Attending: Internal Medicine | Admitting: Internal Medicine

## 2023-03-21 ENCOUNTER — Other Ambulatory Visit: Payer: Self-pay

## 2023-03-21 DIAGNOSIS — I639 Cerebral infarction, unspecified: Principal | ICD-10-CM

## 2023-03-21 DIAGNOSIS — I10 Essential (primary) hypertension: Secondary | ICD-10-CM | POA: Diagnosis not present

## 2023-03-21 DIAGNOSIS — R911 Solitary pulmonary nodule: Secondary | ICD-10-CM | POA: Insufficient documentation

## 2023-03-21 DIAGNOSIS — Z87891 Personal history of nicotine dependence: Secondary | ICD-10-CM | POA: Diagnosis not present

## 2023-03-21 DIAGNOSIS — R202 Paresthesia of skin: Secondary | ICD-10-CM | POA: Diagnosis present

## 2023-03-21 DIAGNOSIS — I482 Chronic atrial fibrillation, unspecified: Secondary | ICD-10-CM | POA: Insufficient documentation

## 2023-03-21 DIAGNOSIS — Z96651 Presence of right artificial knee joint: Secondary | ICD-10-CM | POA: Diagnosis not present

## 2023-03-21 DIAGNOSIS — E785 Hyperlipidemia, unspecified: Secondary | ICD-10-CM | POA: Insufficient documentation

## 2023-03-21 DIAGNOSIS — Z91040 Latex allergy status: Secondary | ICD-10-CM | POA: Diagnosis not present

## 2023-03-21 DIAGNOSIS — J449 Chronic obstructive pulmonary disease, unspecified: Secondary | ICD-10-CM | POA: Diagnosis not present

## 2023-03-21 DIAGNOSIS — I6389 Other cerebral infarction: Secondary | ICD-10-CM | POA: Diagnosis not present

## 2023-03-21 DIAGNOSIS — I6381 Other cerebral infarction due to occlusion or stenosis of small artery: Secondary | ICD-10-CM | POA: Diagnosis not present

## 2023-03-21 DIAGNOSIS — Z79899 Other long term (current) drug therapy: Secondary | ICD-10-CM | POA: Insufficient documentation

## 2023-03-21 DIAGNOSIS — Z7901 Long term (current) use of anticoagulants: Secondary | ICD-10-CM | POA: Diagnosis not present

## 2023-03-21 LAB — BASIC METABOLIC PANEL
Anion gap: 9 (ref 5–15)
BUN: 16 mg/dL (ref 8–23)
CO2: 27 mmol/L (ref 22–32)
Calcium: 9.6 mg/dL (ref 8.9–10.3)
Chloride: 100 mmol/L (ref 98–111)
Creatinine, Ser: 0.49 mg/dL (ref 0.44–1.00)
GFR, Estimated: >60 mL/min (ref >60)
Glucose, Bld: 83 mg/dL (ref 70–99)
Potassium: 3.5 mmol/L (ref 3.5–5.1)
Sodium: 136 mmol/L (ref 135–145)

## 2023-03-21 LAB — CBC
HCT: 42.8 % (ref 36.0–46.0)
Hemoglobin: 13.8 g/dL (ref 12.0–15.0)
MCH: 30.1 pg (ref 26.0–34.0)
MCHC: 32.2 g/dL (ref 30.0–36.0)
MCV: 93.2 fL (ref 80.0–100.0)
Platelets: 287 10*3/uL (ref 150–400)
RBC: 4.59 MIL/uL (ref 3.87–5.11)
RDW: 13.3 % (ref 11.5–15.5)
WBC: 8.1 10*3/uL (ref 4.0–10.5)
nRBC: 0 % (ref 0.0–0.2)

## 2023-03-21 LAB — TROPONIN I (HIGH SENSITIVITY): Troponin I (High Sensitivity): 8 ng/L (ref <18)

## 2023-03-21 LAB — PROTIME-INR
INR: 1.2 (ref 0.8–1.2)
Prothrombin Time: 15.8 s — ABNORMAL HIGH (ref 11.4–15.2)

## 2023-03-21 MED ORDER — METOPROLOL SUCCINATE ER 25 MG PO TB24
25.0000 mg | ORAL_TABLET | Freq: Every day | ORAL | Status: DC
  Administered 2023-03-22: 25 mg via ORAL
  Filled 2023-03-21: qty 1

## 2023-03-21 MED ORDER — STROKE: EARLY STAGES OF RECOVERY BOOK
Freq: Once | Status: AC
  Filled 2023-03-21: qty 1

## 2023-03-21 MED ORDER — VITAMIN C 500 MG PO TABS
1000.0000 mg | ORAL_TABLET | Freq: Every day | ORAL | Status: DC
  Administered 2023-03-22: 1000 mg via ORAL
  Filled 2023-03-21: qty 2

## 2023-03-21 MED ORDER — ISOSORBIDE MONONITRATE ER 30 MG PO TB24
30.0000 mg | ORAL_TABLET | Freq: Every day | ORAL | Status: DC
  Administered 2023-03-22: 30 mg via ORAL
  Filled 2023-03-21: qty 1

## 2023-03-21 MED ORDER — ROSUVASTATIN CALCIUM 5 MG PO TABS: 10.0000 mg | ORAL_TABLET | Freq: Every day | ORAL | Status: DC

## 2023-03-21 MED ORDER — IOHEXOL 350 MG/ML SOLN
75.0000 mL | Freq: Once | INTRAVENOUS | Status: AC | PRN
  Administered 2023-03-21: 75 mL via INTRAVENOUS

## 2023-03-21 MED ORDER — MAGNESIUM 200 MG PO TABS
400.0000 mg | ORAL_TABLET | Freq: Every day | ORAL | Status: DC
  Filled 2023-03-21: qty 2

## 2023-03-21 MED ORDER — OMEGA-3-ACID ETHYL ESTERS 1 G PO CAPS
3.0000 | ORAL_CAPSULE | Freq: Every day | ORAL | Status: DC
Start: 2023-03-22 — End: 2023-03-22
  Filled 2023-03-21: qty 3

## 2023-03-21 MED ORDER — SENNOSIDES-DOCUSATE SODIUM 8.6-50 MG PO TABS: 1.0000 | ORAL_TABLET | Freq: Every evening | ORAL | Status: DC | PRN

## 2023-03-21 MED ORDER — ONDANSETRON HCL 4 MG PO TABS: 4.0000 mg | ORAL_TABLET | Freq: Four times a day (QID) | ORAL | Status: DC | PRN

## 2023-03-21 MED ORDER — APIXABAN 5 MG PO TABS
5.0000 mg | ORAL_TABLET | Freq: Two times a day (BID) | ORAL | Status: DC
  Administered 2023-03-21 – 2023-03-22 (×2): 5 mg via ORAL
  Filled 2023-03-21 (×2): qty 1

## 2023-03-21 MED ORDER — ACETAMINOPHEN 325 MG PO TABS: 650.0000 mg | ORAL_TABLET | Freq: Four times a day (QID) | ORAL | Status: DC | PRN

## 2023-03-21 MED ORDER — POLYVINYL ALCOHOL 1.4 % OP SOLN: 1.0000 [drp] | Freq: Every day | OPHTHALMIC | Status: DC | PRN

## 2023-03-21 MED ORDER — ONDANSETRON HCL 4 MG/2ML IJ SOLN: 4.0000 mg | Freq: Four times a day (QID) | INTRAMUSCULAR | Status: DC | PRN

## 2023-03-21 NOTE — H&P (Signed)
History and Physical    Patient: Kristina Dennis YQI:[id number] DOB: 02/08/1946 DOA: 03/21/2023 DOS: the patient was seen and examined on 03/21/2023 PCP: Elie Confer, NP  Patient coming from: Home  Chief Complaint:  Chief Complaint  Patient presents with   Numbness   HPI: Kristina VUOLO is a 78 y.o. female with medical history significant of GERD, HTN, IBS, HLD, COPD and A-fib on Eliquis who presented for evaluation of left arm and face numbness. Patient reports that around 6 PM yesterday she noticed numbness on her left face while eating.  An hour later she noticed some numbness on her left arm with the arm feeling "hot".  She woke up this morning with persistent numbness on her left face and arm so she called her daughter who advised patient to come to the ED. Patient also reports decree sensation in her left lower extremity but denies any weakness, confusion, chest pain, palpitations, shortness of breath, headache or dizziness.  She reports taking her Eliquis daily. She reports occasional hand pain that she attributes to her statins so she occasionally does not take her Crestor.  ED course: Bradycardic with HR in the 50s otherwise normal vitals. Labs show sodium 136, K+ 3.5, creatinine 0.49, normal CBC, troponin 8, PT/INR 15.8/1.2 MRI brain showed punctate acute to early subacute right thalamic infarct, moderate chronic small vessel ischemic disease and absent flow void in the distal left vertebral artery concerning for possible occlusion or slow flow. CTA head and neck with no LVO but shows severe left P2, V4 and V1 stenosis and unchanged 1.1 cm spiculated RUL pulm nodule Neurology was consulted for evaluation and [facility] was consulted for admission.  Review of Systems: As mentioned in the history of present illness. All other systems reviewed and are negative. Past Medical History:  Diagnosis Date   Arthritis    Colitis 2003   per path report from 2003: non-specific.  normal  colonoscopy in 04/2011.    GERD (gastroesophageal reflux disease)    Hypertension    Irritable bowel syndrome    Osteoporosis    Palpitations    Pneumonia    hx   Rhinitis    Past Surgical History:  Procedure Laterality Date   BUNIONECTOMY Left    COLONOSCOPY     DILATION AND CURETTAGE OF UTERUS     ESOPHAGOGASTRODUODENOSCOPY  2021   KNEE SURGERY Right    arthroscopy   LEFT HEART CATH AND CORONARY ANGIOGRAPHY N/A 10/25/2022   Procedure: LEFT HEART CATH AND CORONARY ANGIOGRAPHY;  Surgeon: Swaziland, Peter M, MD;  Location: [facility] INVASIVE CV LAB;  Service: Cardiovascular;  Laterality: N/A;   ORIF WRIST FRACTURE Left 12/13/2013   Procedure: OPEN REDUCTION INTERNAL FIXATION (ORIF) WRIST FRACTURE;  Surgeon: Cammy Copa, MD;  Location: [facility] OR;  Service: Orthopedics;  Laterality: Left;   TONSILLECTOMY     TUBAL LIGATION     Social History:  reports that she quit smoking about 4 years ago. Her smoking use included cigarettes. She started smoking about 54 years ago. She has a 12.5 pack-year smoking history. She has never used smokeless tobacco. She reports that she does not drink alcohol and does not use drugs.  Allergies  Allergen Reactions   Hydrocodone Nausea And Vomiting   Amlodipine     Severe all over joint pain   Azithromycin Nausea Only   Excedrin Extra Strength [Aspirin-Acetaminophen-Caffeine] Nausea Only    Extreme nausea   Niacin Nausea And Vomiting   Niacin And Related Nausea  And Vomiting   Sulfa Antibiotics Nausea And Vomiting and Other (See Comments)    Violently ill   Sulfasalazine Nausea And Vomiting    Violently ill   Latex Rash    Family History  Problem Relation Age of Onset   Hypertension Father    Arrhythmia Mother    Cancer Sister        rectal    Rectal cancer Sister 80   Colon cancer Sister 36   Pancreatic cancer Neg Hx    Esophageal cancer Neg Hx    Stomach cancer Neg Hx     Prior to Admission medications   Medication Sig Start Date End Date  Taking? Authorizing Provider  acetaminophen (TYLENOL) 500 MG tablet Take 1,000 mg by mouth once as needed for moderate pain (pain score 4-6).   Yes [provider]  Ascorbic Acid (VITAMIN C) 1000 MG tablet Take 1,000 mg by mouth.   Yes [provider]  Coenzyme Q10 (CO Q 10) 100 MG CAPS Take 100 mg by mouth daily in the afternoon.   Yes [provider]  ELDERBERRY PO Take 1,000 mg by mouth daily in the afternoon.   Yes [provider]  ELIQUIS 5 MG TABS tablet TAKE 1 TABLET BY MOUTH TWICE A DAY 11/08/22  Yes Nahser, Deloris Ping, MD  Fish Oil-Cholecalciferol (OMEGA-3 FISH OIL-VITAMIN D3) 1200-1000 MG-UNIT CAPS Take 3 capsules by mouth daily.   Yes [provider]  isosorbide mononitrate (IMDUR) 30 MG 24 hr tablet Take 1 tablet (30 mg total) by mouth daily. 10/25/22 10/25/23 Yes Swaziland, Peter M, MD  Magnesium 400 MG CAPS Take 400 mg by mouth daily.   Yes [provider]  metoprolol succinate (TOPROL-XL) 25 MG 24 hr tablet Take 1 tablet (25 mg total) by mouth daily. 04/30/22 05/01/23 Yes Swinyer, Zachary George, NP  Probiotic Product (PROBIOTIC DAILY) CAPS Take 1 capsule by mouth daily.   Yes [provider]  Propylene Glycol (SYSTANE COMPLETE) 0.6 % SOLN Place 1 drop into both eyes daily as needed (Dry eye).   Yes [provider]  rosuvastatin (CRESTOR) 10 MG tablet Take 1 tablet (10 mg total) by mouth daily. 02/07/23 05/08/23 Yes Swinyer, Zachary George, NP  sodium chloride (OCEAN) 0.65 % SOLN nasal spray Place 1 spray into both nostrils daily as needed for congestion.   Yes [provider]    Physical Exam: Vitals:   03/21/23 1600 03/21/23 1623 03/21/23 1658 03/21/23 1700  BP: 131/87  115/67   Pulse: (!) 58  (!) 59   Resp: 11  18   Temp:  98.5 F (36.9 C) 98.3 F (36.8 C)   TempSrc:  Oral Oral   SpO2: 99%  99%   Weight:    40.8 kg  Height:    5\' 6"  (1.676 m)   General: Pleasant, cachectic elderly woman laying in bed. No acute  distress. HEENT: [place]/AT. Anicteric sclera. MMM. PERRLA. EOMI. No nystagmus. CV: RRR. No murmurs, rubs, or gallops. No LE edema Pulmonary: Lungs CTAB. Normal effort. No wheezing or rales. Abdominal: Soft, nontender, nondistended. Normal bowel sounds. Extremities: Palpable radial and DP pulses. Normal ROM. Skin: Warm and dry. No obvious rash or lesions. Neuro: A&Ox3.  Normal speech. Normal finger-to-nose. Strength 5/5 in all extremities. Mildly decreased sensation to the left face and left upper arm.   Psych: Normal mood and affect  Data Reviewed:  Labs show sodium 136, K+ 3.5, creatinine 0.49, normal CBC, troponin 8, PT/INR 15.8/1.2 MRI brain showed  punctate acute to early subacute right thalamic infarct, moderate chronic small vessel ischemic disease and absent flow void in the distal left vertebral artery concerning for possible occlusion or slow flow. CTA head and neck with no LVO but shows severe left P2, V4 and V1 stenosis and unchanged 1.1 cm spiculated RUL pulm nodule EKG showed normal sinus rhythm with possible left atrial enlargement  Assessment and Plan: Kristina Dennis is a 77 y.o. 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  # Right thalamic infarct Patient with a history of hyperlipidemia, hypertension and A-fib on Eliquis presented with 1 day of left facial and left arm numbness. MRI brain shows punctate right thalamic infarct. She has been adherent to her Eliquis. Etiology of her stroke more likely small vessel disease. Patient had a TTE 4 months ago that showed grade 1 diastolic dysfunction but normal systolic function and left atrium. Patient with history of statin intolerance but currently on low-dose Crestor 10 mg daily. -Admit to progressive bed -Neurology following, appreciate recs -Follow-up lipid panel, A1c -Anticoagulation  with Eliquis 5 mg daily -Frequent neurochecks -PT/OT eval and treat -Telemetry -Permissive hypertension, BP goal <220/110  # A-fib Patient with a history of A-fib on Eliquis which patient reports taking daily. EKG on admission showed normal sinus rhythm. -Telemetry -Eliquis 5 mg twice daily -Toprol XL 25 mg daily  # HTN BP stable with SBP in the 120s to 130s. -Imdur 30 mg daily -Toprol XL 25 mg daily  # HLD Reports history of statin intolerance currently on moderate intensity statin -Crestor 10 mg daily -Lovaza 3 g daily -Consider addition of PSK 9 inhibitor in the outpatient  # Pulmonary nodule Patient found to have a 1.1 cm spiculated pulmonary nodule in the RUL on CT chest in July that was suspicious for malignancy. Patient followed up with pulmonology in the outpatient with plan to repeat CT in October. -Repeat CT chest  # COPD No wheezing on exam. -As needed DuoNebs   Advance Care Planning:   Code Status: Full Code   Consults: Neurology  Family Communication: Discussed admission with spouse at bedside  Severity of Illness: The appropriate patient status for this patient is INPATIENT. Inpatient status is judged to be reasonable and necessary in order to provide the required intensity of service to ensure the patient's safety. The patient's presenting symptoms, physical exam findings, and initial radiographic and laboratory data in the context of their chronic comorbidities is felt to place them at high risk for further clinical deterioration. Furthermore, it is not anticipated that the patient will be medically stable for discharge from the hospital within 2 midnights of admission.   * I certify that at the point of admission it is my clinical judgment that the patient will require inpatient hospital care spanning beyond 2 midnights from the point of admission due to high intensity of service, high risk for further deterioration and high frequency of surveillance  required.*  Author: Steffanie Rainwater, MD 03/21/2023 5:41 PM  For on call review [email or web address].

## 2023-03-21 NOTE — Consult Note (Signed)
NEUROLOGY CONSULT NOTE   Date of service: March 21, 2023 Patient Name: Kristina Dennis MRN:  [id number] DOB:  [birth year] Chief Complaint: "Numbness to left arm and face" Requesting Provider: Durwin Glaze, MD  History of Present Illness  ITCEL BUTTRY is a 77 y.o. female  has a past medical history of Arthritis, Colitis (2003), GERD (gastroesophageal reflux disease), Hypertension, Irritable bowel syndrome, Osteoporosis, Palpitations, Pneumonia, and Rhinitis.,  Also COPD and atrial fibrillation on Eliquis presents with sudden onset numbness to the left arm and face as well as diminished sensation in the left leg.  Patient states that symptoms began shortly after 1800 last night.  She is taking Eliquis for atrial fibrillation and states she has not missed any doses.  MRI reveals small stroke in right thalamus which easily explains patient's symptoms.  Patient does have a history of hyperlipidemia but has had difficulty tolerating statins in the past.  She states that she sometimes has hand pain which she does attribute to the statins and has stopped taking them in the past due to muscle aches.   LKW: 11/28 1800 Modified rankin score: 1-No significant post stroke disability and can perform usual duties with stroke symptoms IV Thrombolysis: No, completed stroke on MRI EVT: No, completed stroke on MRI  NIHSS components Score: Comment  1a Level of Conscious 0[x]  1[]  2[]  3[]      1b LOC Questions 0[x]  1[]  2[]       1c LOC Commands 0[x]  1[]  2[]       2 Best Gaze 0[x]  1[]  2[]       3 Visual 0[x]  1[]  2[]  3[]      4 Facial Palsy 0[x]  1[]  2[]  3[]      5a Motor Arm - left 0[x]  1[]  2[]  3[]  4[]  UN[]    5b Motor Arm - Right 0[x]  1[]  2[]  3[]  4[]  UN[]    6a Motor Leg - Left 0[x]  1[]  2[]  3[]  4[]  UN[]    6b Motor Leg - Right 0[x]  1[]  2[]  3[]  4[]  UN[]    7 Limb Ataxia 0[x]  1[]  2[]  3[]  UN[]     8 Sensory 0[]  1[x]  2[]  UN[]      9 Best Language 0[x]  1[]  2[]  3[]      10 Dysarthria 0[x]  1[]  2[]  UN[]      11 Extinct.  and Inattention 0[x]  1[]  2[]       TOTAL:1       ROS  Comprehensive ROS performed and pertinent positives documented in HPI   Past History   Past Medical History:  Diagnosis Date   Arthritis    Colitis 2003   per path report from 2003: non-specific.  normal colonoscopy in 04/2011.    GERD (gastroesophageal reflux disease)    Hypertension    Irritable bowel syndrome    Osteoporosis    Palpitations    Pneumonia    hx   Rhinitis     Past Surgical History:  Procedure Laterality Date   BUNIONECTOMY Left    COLONOSCOPY     DILATION AND CURETTAGE OF UTERUS     ESOPHAGOGASTRODUODENOSCOPY  2021   KNEE SURGERY Right    arthroscopy   LEFT HEART CATH AND CORONARY ANGIOGRAPHY N/A 10/25/2022   Procedure: LEFT HEART CATH AND CORONARY ANGIOGRAPHY;  Surgeon: Swaziland, Peter M, MD;  Location: [facility] INVASIVE CV LAB;  Service: Cardiovascular;  Laterality: N/A;   ORIF WRIST FRACTURE Left 12/13/2013   Procedure: OPEN REDUCTION INTERNAL FIXATION (ORIF) WRIST FRACTURE;  Surgeon: Cammy Copa, MD;  Location: [facility] OR;  Service: Orthopedics;  Laterality: Left;  TONSILLECTOMY     TUBAL LIGATION      Family History: Family History  Problem Relation Age of Onset   Hypertension Father    Arrhythmia Mother    Cancer Sister        rectal    Rectal cancer Sister 73   Colon cancer Sister 90   Pancreatic cancer Neg Hx    Esophageal cancer Neg Hx    Stomach cancer Neg Hx     Social History  reports that she quit smoking about 4 years ago. Her smoking use included cigarettes. She started smoking about 54 years ago. She has a 12.5 pack-year smoking history. She has never used smokeless tobacco. She reports that she does not drink alcohol and does not use drugs.  Allergies  Allergen Reactions   Hydrocodone Nausea And Vomiting   Amlodipine     Severe all over joint pain   Azithromycin Nausea Only   Excedrin Extra Strength [Aspirin-Acetaminophen-Caffeine] Nausea Only    Extreme nausea   Niacin  Nausea And Vomiting   Niacin And Related Nausea And Vomiting   Sulfa Antibiotics Nausea And Vomiting and Other (See Comments)    Violently ill   Sulfasalazine Nausea And Vomiting    Violently ill   Latex Rash    Medications   Current Facility-Administered Medications:    [START ON 03/22/2023]  stroke: early stages of recovery book, , Does not apply, Once, de Saintclair Halsted, Cortney E, NP  Current Outpatient Medications:    Ascorbic Acid (VITAMIN C) 1000 MG tablet, Take 1,000 mg by mouth., Disp: , Rfl:    Coenzyme Q10 (CO Q 10) 100 MG CAPS, Take 100 mg by mouth daily in the afternoon., Disp: , Rfl:    ELDERBERRY PO, Take 1,000 mg by mouth daily in the afternoon., Disp: , Rfl:    ELIQUIS 5 MG TABS tablet, TAKE 1 TABLET BY MOUTH TWICE A DAY, Disp: 60 tablet, Rfl: 5   Fish Oil-Cholecalciferol (OMEGA-3 FISH OIL-VITAMIN D3) 1200-1000 MG-UNIT CAPS, Take 3 capsules by mouth daily., Disp: , Rfl:    isosorbide mononitrate (IMDUR) 30 MG 24 hr tablet, Take 1 tablet (30 mg total) by mouth daily., Disp: 30 tablet, Rfl: 11   Magnesium 400 MG CAPS, Take 400 mg by mouth daily., Disp: , Rfl:    metoprolol succinate (TOPROL-XL) 25 MG 24 hr tablet, Take 1 tablet (25 mg total) by mouth daily., Disp: 90 tablet, Rfl: 3   Probiotic Product (PROBIOTIC DAILY) CAPS, Take 1 capsule by mouth daily., Disp: , Rfl:    Propylene Glycol (SYSTANE COMPLETE) 0.6 % SOLN, Place 1 drop into both eyes daily as needed (Dry eye)., Disp: , Rfl:    rosuvastatin (CRESTOR) 10 MG tablet, Take 1 tablet (10 mg total) by mouth daily., Disp: , Rfl:    sodium chloride (OCEAN) 0.65 % SOLN nasal spray, Place 1 spray into both nostrils daily as needed for congestion., Disp: , Rfl:   Vitals   Vitals:   03/21/23 1030 03/21/23 1130 03/21/23 1200 03/21/23 1230  BP: 112/66 120/68 111/62 127/71  Pulse: (!) 54 (!) 50 (!) 52 (!) 55  Resp: 13 14 14 14   Temp:      TempSrc:      SpO2: 100% 100% 99% 98%  Weight:      Height:        Body mass index  is 14.53 kg/m.  Physical Exam   Constitutional: Well-developed, well-nourished elderly patient in no acute distress Psych: Affect appropriate to  situation.  Eyes: No scleral injection.  HENT: No OP obstruction.  Head: Normocephalic.  Cardiovascular: Normal rate and regular rhythm.  Sinus rhythm on the monitor Respiratory: Effort normal, non-labored breathing.  Skin: WDI.   Neurologic Examination    NEURO:  Mental Status: Alert and oriented to person place time and situation, able to give clear and coherent history of present illness Speech/Language: speech is without dysarthria or aphasia.   Cranial Nerves:  II: PERRL. Visual fields full.  III, IV, VI: EOMI. Eyelids elevate symmetrically.  V: Sensation is intact to light touch and symmetrical to face.  VII: Smile is symmetrical.  VIII: hearing intact to voice. IX, X: Phonation is normal.  NF:[id number] shrug 5/5. XII: tongue is midline without fasciculations. Motor: 5/5 strength to all muscle groups tested.  Tone: is normal and bulk is normal Sensation- Intact to light touch bilaterally.  Coordination: FTN intact bilaterally.No drift.  Gait- deferred   Labs/Imaging/Neurodiagnostic studies   CBC:  Recent Labs  Lab 04/11/2023 0824  WBC 8.1  HGB 13.8  HCT 42.8  MCV 93.2  PLT 287   Basic Metabolic Panel:  Lab Results  Component Value Date   NA 136 Apr 11, 2023   K 3.5 11-Apr-2023   CO2 27 04/11/2023   GLUCOSE 83 11-Apr-2023   BUN 16 2023/04/11   CREATININE 0.49 Apr 11, 2023   CALCIUM 9.6 2023-04-11   GFRNONAA >60 Apr 11, 2023   GFRAA >60 10/15/2017   Lipid Panel:  Lab Results  Component Value Date   LDLCALC 123 (H) 01/29/2023   HgbA1c: No results found for: "HGBA1C" Urine Drug Screen: No results found for: "LABOPIA", "COCAINSCRNUR", "LABBENZ", "AMPHETMU", "THCU", "LABBARB"  Alcohol Level No results found for: "ETH" INR  Lab Results  Component Value Date   INR 1.2 04/11/2023    MRI Brain(Personally  reviewed): Punctate acute right thalamic infarct  ASSESSMENT   CASTIEL CHILCOTE is a 77 y.o. female  has a past medical history of Arthritis, Colitis (2003), GERD (gastroesophageal reflux disease), Hypertension, Irritable bowel syndrome, Osteoporosis, Palpitations, Pneumonia, and Rhinitis.,  Also COPD and A-fib on Eliquis presents with sudden onset left arm, face and leg numbness.  MRI reveals punctate right thalamic infarct, which usually explains patient's symptoms.  She will need admission for full stroke workup.  She states that she takes her Eliquis regularly and has not missed any doses recently.  Etiology of stroke is either embolic or more likely small vessel disease.  She has had trouble with statin intolerance in the past and may benefit from [facility] or PCSK9 inhibitor.  RECOMMENDATIONS  Stroke/TIA Workup  - Admit for stroke workup - Permissive HTN x48 hrs from sx onset or until stroke ruled out by MRI goal BP <220/110. PRN labetalol or hydralazine if BP above these parameters. Avoid oral antihypertensives. - CTA head and neck -Echocardiogram not necessary as patient has known A-fib and is already fully anticoagulated - Check A1c and LDL + add statin per guidelines -Continue anticoagulation with Eliquis - q4 hr neuro checks - STAT head CT for any change in neuro exam - Tele - PT/OT/SLP - Stroke education - Amb referral to neurology upon discharge   ______________________________________________________________________  Patient seen by NP and then by MD, MD to edit note as needed.  Signed, Cortney E Ernestina Columbia, NP [facility]   NEUROHOSPITALIST ADDENDUM Performed a face to face diagnostic evaluation.   I have reviewed the contents of history and physical exam as documented by PA/ARNP/Resident and agree with above documentation.  I have discussed and formulated the above plan as documented. Edits to the note have been made as needed.  Impression/Key exam  findings/Plan: likely small vessel R thalamic stroke. Has hyperlipidemia and did not tolerate rosuvastatin in the past. She would benefit from PCSK 9 inhibitors outpatient. Will get full stroke workup. No need for repeat TTE since she already has Afibb.  Erick Blinks, MD [facility] [phone]   If 7pm to 7am, please call on call as listed on AMION.

## 2023-03-21 NOTE — ED Notes (Signed)
Patient transported to MRI 

## 2023-03-21 NOTE — ED Notes (Signed)
ED TO INPATIENT HANDOFF REPORT  ED Nurse Name and Phone #: Murlean Iba Paramedic [phone]  S Name/Age/Gender Kristina Dennis 77 y.o. female Room/Bed: 019C/019C  Code Status   Code Status: Full Code  Home/SNF/Other Home Patient oriented to: self, place, time, and situation Is this baseline? Yes   Triage Complete: Triage complete  Chief Complaint Right thalamic stroke [facility]) [I63.81]  Triage Note Pt c.o left sided facial numbness that started around 730 last night, the numbness radiates down to her left arm.    Allergies Allergies  Allergen Reactions   Hydrocodone Nausea And Vomiting   Amlodipine     Severe all over joint pain   Azithromycin Nausea Only   Excedrin Extra Strength [Aspirin-Acetaminophen-Caffeine] Nausea Only    Extreme nausea   Niacin Nausea And Vomiting   Niacin And Related Nausea And Vomiting   Sulfa Antibiotics Nausea And Vomiting and Other (See Comments)    Violently ill   Sulfasalazine Nausea And Vomiting    Violently ill   Latex Rash    Level of Care/Admitting Diagnosis ED Disposition     ED Disposition  Admit   Condition  --   Comment  Hospital Area: [facility] [[id number]]  Level of Care: Progressive [102]  Admit to Progressive based on following criteria: NEUROLOGICAL AND NEUROSURGICAL complex patients with significant risk of instability, who do not meet ICU criteria, yet require close observation or frequent assessment (< / = every 2 - 4 hours) with medical / nursing intervention.  May admit patient to [facility] or [facility] if equivalent level of care is available:: No  Covid Evaluation: Asymptomatic - no recent exposure (last 10 days) testing not required  Diagnosis: Right thalamic stroke [facility]) [[id number]]  Admitting Physician: Steffanie Rainwater [[id number]]  Attending Physician: Steffanie Rainwater [[id number]]  Certification:: I certify this patient will need inpatient services for at least 2 midnights  Expected Medical  Readiness: 03/23/2023          B Medical/Surgery History Past Medical History:  Diagnosis Date   Arthritis    Colitis 2003   per path report from 2003: non-specific.  normal colonoscopy in 04/2011.    GERD (gastroesophageal reflux disease)    Hypertension    Irritable bowel syndrome    Osteoporosis    Palpitations    Pneumonia    hx   Rhinitis    Past Surgical History:  Procedure Laterality Date   BUNIONECTOMY Left    COLONOSCOPY     DILATION AND CURETTAGE OF UTERUS     ESOPHAGOGASTRODUODENOSCOPY  2021   KNEE SURGERY Right    arthroscopy   LEFT HEART CATH AND CORONARY ANGIOGRAPHY N/A 10/25/2022   Procedure: LEFT HEART CATH AND CORONARY ANGIOGRAPHY;  Surgeon: Swazil[place], Peter M, MD;  Location: [facility] INVASIVE CV LAB;  Service: Cardiovascular;  Laterality: N/A;   ORIF WRIST FRACTURE Left 12/13/2013   Procedure: OPEN REDUCTION INTERNAL FIXATION (ORIF) WRIST FRACTURE;  Surgeon: Cammy Copa, MD;  Location: [facility] OR;  Service: Orthopedics;  Laterality: Left;   TONSILLECTOMY     TUBAL LIGATION       A IV Location/Drains/Wounds Patient Lines/Drains/Airways Status     Active Line/Drains/Airways     Name Placement date Placement time Site Days   Peripheral IV 03/21/23 20 G 1" Anterior;Left Forearm 03/21/23  0929  Forearm  less than 1            Intake/Output Last 24 hours No intake or output data in  the 24 hours ending 03/21/23 1447  Labs/Imaging Results for orders placed or performed during the hospital encounter of 03/21/23 (from the past 48 hour(s))  Basic metabolic panel     Status: None   Collection Time: 03/21/23  8:24 AM  Result Value Ref Range   Sodium 136 135 - 145 mmol/L   Potassium 3.5 3.5 - 5.1 mmol/L   Chloride 100 98 - 111 mmol/L   CO2 27 22 - 32 mmol/L   Glucose, Bld 83 70 - 99 mg/dL    Comment: Glucose reference range applies only to samples taken after fasting for at least 8 hours.   BUN 16 8 - 23 mg/dL   Creatinine, Ser 5.36 0.44 - 1.00 mg/dL    Calcium 9.6 8.9 - 64.4 mg/dL   GFR, Estimated >03 >47 mL/min    Comment: (NOTE) Calculated using the CKD-EPI Creatinine Equation (2021)    Anion gap 9 5 - 15    Comment: Performed at [facility], [address] [place], [place] [address]  CBC     Status: None   Collection Time: 03/21/23  8:24 AM  Result Value Ref Range   WBC 8.1 4.0 - 10.5 K/uL   RBC 4.59 3.87 - 5.11 MIL/uL   Hemoglobin 13.8 12.0 - 15.0 g/dL   HCT 63.8 75.6 - 43.3 %   MCV 93.2 80.0 - 100.0 fL   MCH 30.1 26.0 - 34.0 pg   MCHC 32.2 30.0 - 36.0 g/dL   RDW 29.5 18.8 - 41.6 %   Platelets 287 150 - 400 K/uL   nRBC 0.0 0.0 - 0.2 %    Comment: Performed at [facility], [address] [place], [place] [address]  Troponin I (High Sensitivity)     Status: None   Collection Time: 03/21/23  8:24 AM  Result Value Ref Range   Troponin I (High Sensitivity) 8 <18 ng/L    Comment: (NOTE) Elevated high sensitivity troponin I (hsTnI) values and significant  changes across serial measurements may suggest ACS but many other  chronic and acute conditions are known to elevate hsTnI results.  Refer to the "Links" section for chest pain algorithms and additional  guidance. Performed at [facility], [address] [place], [place] [address]   Protime-INR     Status: Abnormal   Collection Time: 03/21/23  8:24 AM  Result Value Ref Range   Prothrombin Time 15.8 (H) 11.4 - 15.2 seconds   INR 1.2 0.8 - 1.2    Comment: (NOTE) INR goal varies based on device and disease states. Performed at [facility], [address], [place], [place] [address]    CT ANGIO HEAD NECK W WO CM  Result Date: 03/21/2023 CLINICAL DATA:  Stroke/TIA, determine embolic source. Numbness. Right thalamic infarct on MRI. EXAM: CT ANGIOGRAPHY HEAD AND NECK WITH AND WITHOUT CONTRAST TECHNIQUE: Multidetector CT imaging of the head and neck was performed using the standard protocol during bolus administration of intravenous contrast.  Multiplanar CT image reconstructions and MIPs were obtained to evaluate the vascular anatomy. Carotid stenosis measurements (when applicable) are obtained utilizing NASCET criteria, using the distal internal carotid diameter as the denominator. RADIATION DOSE REDUCTION: This exam was performed according to the departmental dose-optimization program which includes automated exposure control, adjustment of the mA and/or kV according to patient size and/or use of iterative reconstruction technique. CONTRAST:  75mL OMNIPAQUE IOHEXOL 350 MG/ML SOLN COMPARISON:  Head MRI 03/21/2023.  Chest CT  11/07/2022. FINDINGS: CT HEAD FINDINGS Brain: The punctate right thalamic infarct on MRI is not apparent by CT. Patchy hypodensities in the cerebral white matter nonspecific but compatible with moderate chronic small vessel ischemic disease. There is mild cerebral atrophy. Vascular: Calcified atherosclerosis at the skull base. Skull: No acute fracture or suspicious osseous lesion. Sinuses/Orbits: Mild mucosal thickening in the left maxillary sinus. Left mastoid effusion. Bilateral cataract extraction. Other: None. Review of the MIP images confirms the above findings CTA NECK FINDINGS Aortic arch: Standard 3 vessel aortic arch with mild atherosclerosis. No significant stenosis of the arch vessel origins. Right carotid system: Patent with a small to moderate amount of calcified plaque at the carotid bifurcation. No evidence of a significant stenosis or dissection. Left carotid system: Patent with a moderate amount calcified plaque at the carotid bifurcation. No evidence of a significant stenosis or dissection. Vertebral arteries: The right vertebral artery is widely patent and strongly dominant. The left vertebral artery is patent in the neck and diffusely small with a severe stenosis at its origin. Skeleton: Cervical disc degeneration which is most advanced at C5-6 and C6-7. Focal kyphosis centered at C4-5 where there is grade 1  anterolisthesis. Other neck: No evidence of cervical lymphadenopathy or mass. Upper chest: Biapical lung scarring and moderate centrilobular emphysema. Unchanged 1.1 cm spiculated nodule in the right upper lobe (series 11, image 169). Review of the MIP images confirms the above findings CTA HEAD FINDINGS Anterior circulation: The internal carotid arteries are patent from skull base to carotid termini with atherosclerotic calcification bilaterally not resulting in significant stenosis. ACAs and MCAs are patent without evidence of a proximal Urie Loughner occlusion or significant proximal stenosis. No aneurysm is identified. Posterior circulation: The intracranial vertebral arteries are patent to the basilar with the left V4 segment being heavily diseased with multiple severe stenoses. There is mild-to-moderate atherosclerosis of the right V4 segment without significant stenosis. Patent PICA and SCA origins are visualized bilaterally. The basilar artery is widely patent. There is a small left posterior communicating artery. Both PCAs are patent without evidence of a significant proximal stenosis on the right. There are severe proximal and distal left P2 stenoses. No aneurysm is identified. Venous sinuses: Absent opacification of the nondominant left sigmoid sinus, favored to be related to contrast timing and slower flow based on today's MRI rather than occlusion. Anatomic variants: None. Review of the MIP images confirms the above findings IMPRESSION: 1. No emergent large vessel occlusion. 2. Severe left P2, left V4, and left V1 stenoses. 3. Unchanged 1.1 cm spiculated right upper lobe pulmonary nodule. Pulmonary malignancy remains a concern, and continued follow-up is recommended with chest CT versus PET-CT or tissue sampling. 4. Aortic Atherosclerosis (ICD10-I70.0) and Emphysema (ICD10-J43.9). Electronically Signed   By: Sebastian Ache M.D.   On: 03/21/2023 14:27   MR BRAIN WO CONTRAST  Result Date: 03/21/2023 CLINICAL  DATA:  Neuro deficit, acute, stroke suspected. Left facial numbness beginning last night with radiation to the left arm. EXAM: MRI HEAD WITHOUT CONTRAST TECHNIQUE: Multiplanar, multiecho pulse sequences of the brain and surrounding structures were obtained without intravenous contrast. COMPARISON:  Head CT 10/15/2017 FINDINGS: Brain: There is a 3 mm acute to early subacute infarct in the right thalamus. Patchy T2 hyperintensities in the cerebral white matter and pons are nonspecific but compatible with moderate chronic small vessel ischemic disease. Asymmetric gliosis is noted in the left frontal white matter. There is mild generalized cerebral atrophy. No intracranial hemorrhage, mass, midline shift, or extra-axial fluid collection  is identified. Vascular: Absent flow void in the distal left vertebral artery. Skull and upper cervical spine: Unremarkable bone marrow signal. Sinuses/Orbits: Bilateral cataract extraction. Minor mucosal thickening in the paranasal sinuses. Trace right and moderate left mastoid effusions. Other: None. IMPRESSION: 1. Punctate acute to early subacute right thalamic infarct. 2. Moderate chronic small vessel ischemic disease. 3. Absent flow void in the distal left vertebral artery which may reflect occlusion or slow flow. Electronically Signed   By: Sebastian Ache M.D.   On: 03/21/2023 09:18    Pending Labs Unresulted Labs (From admission, onward)     Start     Ordered   03/22/23 0500  Lipid panel  (Labs)  Tomorrow morning,   R       Comments: Fasting    03/21/23 1303   03/22/23 0500  Hemoglobin A1c  (Labs)  Tomorrow morning,   R       Comments: To assess prior glycemic control    03/21/23 1303            Vitals/Pain Today's Vitals   03/21/23 1200 03/21/23 1230 03/21/23 1300 03/21/23 1303  BP: 111/62 127/71 135/66   Pulse: (!) 52 (!) 55 (!) 51   Resp: 14 14 14    Temp:    97.7 F (36.5 C)  TempSrc:    Oral  SpO2: 99% 98% 100%   Weight:      Height:      PainSc:         Isolation Precautions No active isolations  Medications Medications   stroke: early stages of recovery book (has no administration in time range)  senna-docusate (Senokot-S) tablet 1 tablet (has no administration in time range)  ondansetron (ZOFRAN) tablet 4 mg (has no administration in time range)    Or  ondansetron (ZOFRAN) injection 4 mg (has no administration in time range)  acetaminophen (TYLENOL) tablet 650 mg (has no administration in time range)  iohexol (OMNIPAQUE) 350 MG/ML injection 75 mL (75 mLs Intravenous Contrast Given 03/21/23 1357)    Mobility walks     Focused Assessments Neuro Assessment Handoff:  Swallow screen pass? Yes  Cardiac Rhythm: Sinus bradycardia NIH Stroke Scale  Dizziness Present: No Headache Present: No Interval: Initial Level of Consciousness (1a.)   : Alert, keenly responsive LOC Questions (1b. )   : Answers both questions correctly LOC Commands (1c. )   : Performs both tasks correctly Best Gaze (2. )  : Normal Visual (3. )  : No visual loss Facial Palsy (4. )    : Normal symmetrical movements Motor Arm, Left (5a. )   : No drift Motor Arm, Right (5b. ) : No drift Motor Leg, Left (6a. )  : No drift Motor Leg, Right (6b. ) : No drift Limb Ataxia (7. ): Absent Sensory (8. )  : Mild-to-moderate sensory loss, patient feels pinprick is less sharp or is dull on the affected side, or there is a loss of superficial pain with pinprick, but patient is aware of being touched Best Language (9. )  : No aphasia Dysarthria (10. ): Normal Extinction/Inattention (11.)   : No Abnormality Complete NIHSS TOTAL: 1     Neuro Assessment:   Neuro Checks:   Initial (03/21/23 0826)  Has TPA been given? No If patient is a Neuro Trauma and patient is going to OR before floor call report to 4N Charge nurse: [phone] or [phone]   R Recommendations: See Admitting Provider Note  Report given to:   Additional  Notes: Q4 NIH

## 2023-03-21 NOTE — Plan of Care (Signed)
Received from ED.  Oriented to room and surroundings.  Educated on stroke.  Question and answer session.     Problem: Education: Goal: Knowledge of disease or condition will improve Outcome: Progressing Goal: Knowledge of secondary prevention will improve (MUST DOCUMENT ALL) Outcome: Progressing Goal: Knowledge of patient specific risk factors will improve [name] N/A or DELETE if not current risk factor) Outcome: Progressing   Problem: Ischemic Stroke/TIA Tissue Perfusion: Goal: Complications of ischemic stroke/TIA will be minimized Outcome: Progressing   Problem: Coping: Goal: Will verbalize positive feelings about self Outcome: Progressing Goal: Will identify appropriate support needs Outcome: Progressing   Problem: Health Behavior/Discharge Planning: Goal: Ability to manage health-related needs will improve Outcome: Progressing Goal: Goals will be collaboratively established with patient/family Outcome: Progressing   Problem: Self-Care: Goal: Ability to participate in self-care as condition permits will improve Outcome: Progressing Goal: Verbalization of feelings and concerns over difficulty with self-care will improve Outcome: Progressing Goal: Ability to communicate needs accurately will improve Outcome: Progressing   Problem: Nutrition: Goal: Risk of aspiration will decrease Outcome: Progressing Goal: Dietary intake will improve Outcome: Progressing   Problem: Education: Goal: Knowledge of General Education information will improve Description: Including pain rating scale, medication(s)/side effects and non-pharmacologic comfort measures Outcome: Progressing   Problem: Health Behavior/Discharge Planning: Goal: Ability to manage health-related needs will improve Outcome: Progressing   Problem: Clinical Measurements: Goal: Ability to maintain clinical measurements within normal limits will improve Outcome: Progressing Goal: Will remain free from  infection Outcome: Progressing Goal: Diagnostic test results will improve Outcome: Progressing Goal: Respiratory complications will improve Outcome: Progressing Goal: Cardiovascular complication will be avoided Outcome: Progressing   Problem: Activity: Goal: Risk for activity intolerance will decrease Outcome: Progressing   Problem: Nutrition: Goal: Adequate nutrition will be maintained Outcome: Progressing   Problem: Coping: Goal: Level of anxiety will decrease Outcome: Progressing   Problem: Elimination: Goal: Will not experience complications related to bowel motility Outcome: Progressing Goal: Will not experience complications related to urinary retention Outcome: Progressing   Problem: Pain Management: Goal: General experience of comfort will improve Outcome: Progressing   Problem: Safety: Goal: Ability to remain free from injury will improve Outcome: Progressing   Problem: Skin Integrity: Goal: Risk for impaired skin integrity will decrease Outcome: Progressing

## 2023-03-21 NOTE — ED Triage Notes (Signed)
Pt c.o left sided facial numbness that started around 730 last night, the numbness radiates down to her left arm.

## 2023-03-21 NOTE — ED Provider Notes (Signed)
[facility] EMERGENCY DEPARTMENT AT [facility] Provider Note   CSN: [id number] Arrival date & time: 03/21/23  4401     History  Chief Complaint  Patient presents with   Numbness    Kristina Dennis is a 77 y.o. female.  77 year old female with past medical history of atrial fibrillation on Eliquis, hypertension, and hyperlipidemia presenting to the emergency department today with concern for left-sided facial numbness as well as numbness in her left arm and leg.  The patient states this began last night at 7:30 PM while she was eating.  She reports she woke up this morning the symptoms had persisted so she came to the ER for further evaluation.  She denies any significant weakness with this.  Denies any headache.  She denies any chest pain or shortness of breath.        Home Medications Prior to Admission medications   Medication Sig Start Date End Date Taking? Authorizing Provider  acetaminophen (TYLENOL) 500 MG tablet Take 1,000 mg by mouth once as needed for moderate pain (pain score 4-6).   Yes [provider]  Ascorbic Acid (VITAMIN C) 1000 MG tablet Take 1,000 mg by mouth.   Yes [provider]  Coenzyme Q10 (CO Q 10) 100 MG CAPS Take 100 mg by mouth daily in the afternoon.   Yes [provider]  ELDERBERRY PO Take 1,000 mg by mouth daily in the afternoon.   Yes [provider]  ELIQUIS 5 MG TABS tablet TAKE 1 TABLET BY MOUTH TWICE A DAY 11/08/22  Yes Nahser, Deloris Ping, MD  Fish Oil-Cholecalciferol (OMEGA-3 FISH OIL-VITAMIN D3) 1200-1000 MG-UNIT CAPS Take 3 capsules by mouth daily.   Yes [provider]  isosorbide mononitrate (IMDUR) 30 MG 24 hr tablet Take 1 tablet (30 mg total) by mouth daily. 10/25/22 10/25/23 Yes [place], Peter M, MD  Magnesium 400 MG CAPS Take 400 mg by mouth daily.   Yes [provider]  metoprolol succinate (TOPROL-XL) 25 MG 24 hr tablet Take 1 tablet (25 mg total) by mouth daily. 04/30/22 05/01/23  Yes Swinyer, Zachary George, NP  Probiotic Product (PROBIOTIC DAILY) CAPS Take 1 capsule by mouth daily.   Yes [provider]  Propylene Glycol (SYSTANE COMPLETE) 0.6 % SOLN Place 1 drop into both eyes daily as needed (Dry eye).   Yes [provider]  rosuvastatin (CRESTOR) 10 MG tablet Take 1 tablet (10 mg total) by mouth daily. 02/07/23 05/08/23 Yes Swinyer, Zachary George, NP  sodium chloride (OCEAN) 0.65 % SOLN nasal spray Place 1 spray into both nostrils daily as needed for congestion.   Yes [provider]      Allergies    Hydrocodone, Amlodipine, Azithromycin, Excedrin extra strength [aspirin-acetaminophen-caffeine], Niacin, Niacin and related, Sulfa antibiotics, Sulfasalazine, and Latex    Review of Systems   Review of Systems  Neurological:  Positive for numbness. Negative for weakness.  All other systems reviewed and are negative.   Physical Exam Updated Vital Signs BP 132/68   Pulse (!) 52   Temp 97.7 F (36.5 C) (Oral)   Resp 11   Ht 5\' 6"  (1.676 m)   Wt 40.8 kg   LMP 05/03/1987   SpO2 100%   BMI 14.53 kg/m  Physical Exam Vitals and nursing note reviewed.   Gen: NAD Eyes: PERRL, EOMI HEENT: no oropharyngeal swelling Neck: trachea midline Resp: clear to auscultation bilaterally Card: RRR, no murmurs, rubs, or gallops Abd: nontender, nondistended Extremities: no calf tenderness,  no edema Vascular: 2+ radial pulses bilaterally, 2+ DP pulses bilaterally Neuro: See NIH stroke scale Skin: no rashes Psyc: acting appropriately   ED Results / Procedures / Treatments   Labs (all labs ordered are listed, but only abnormal results are displayed) Labs Reviewed  PROTIME-INR - Abnormal; Notable for the following components:      Result Value   Prothrombin Time 15.8 (*)    All other components within normal limits  BASIC METABOLIC PANEL  CBC  TROPONIN I (HIGH SENSITIVITY)    EKG None  Radiology CT ANGIO HEAD NECK W WO CM  Result Date:  03/21/2023 CLINICAL DATA:  Stroke/TIA, determine embolic source. Numbness. Right thalamic infarct on MRI. EXAM: CT ANGIOGRAPHY HEAD AND NECK WITH AND WITHOUT CONTRAST TECHNIQUE: Multidetector CT imaging of the head and neck was performed using the standard protocol during bolus administration of intravenous contrast. Multiplanar CT image reconstructions and MIPs were obtained to evaluate the vascular anatomy. Carotid stenosis measurements (when applicable) are obtained utilizing NASCET criteria, using the distal internal carotid diameter as the denominator. RADIATION DOSE REDUCTION: This exam was performed according to the departmental dose-optimization program which includes automated exposure control, adjustment of the mA and/or kV according to patient size and/or use of iterative reconstruction technique. CONTRAST:  75mL OMNIPAQUE IOHEXOL 350 MG/ML SOLN COMPARISON:  Head MRI 03/21/2023.  Chest CT 11/07/2022. FINDINGS: CT HEAD FINDINGS Brain: The punctate right thalamic infarct on MRI is not apparent by CT. Patchy hypodensities in the cerebral white matter nonspecific but compatible with moderate chronic small vessel ischemic disease. There is mild cerebral atrophy. Vascular: Calcified atherosclerosis at the skull base. Skull: No acute fracture or suspicious osseous lesion. Sinuses/Orbits: Mild mucosal thickening in the left maxillary sinus. Left mastoid effusion. Bilateral cataract extraction. Other: None. Review of the MIP images confirms the above findings CTA NECK FINDINGS Aortic arch: Standard 3 vessel aortic arch with mild atherosclerosis. No significant stenosis of the arch vessel origins. Right carotid system: Patent with a small to moderate amount of calcified plaque at the carotid bifurcation. No evidence of a significant stenosis or dissection. Left carotid system: Patent with a moderate amount calcified plaque at the carotid bifurcation. No evidence of a significant stenosis or dissection. Vertebral  arteries: The right vertebral artery is widely patent and strongly dominant. The left vertebral artery is patent in the neck and diffusely small with a severe stenosis at its origin. Skeleton: Cervical disc degeneration which is most advanced at C5-6 and C6-7. Focal kyphosis centered at C4-5 where there is grade 1 anterolisthesis. Other neck: No evidence of cervical lymphadenopathy or mass. Upper chest: Biapical lung scarring and moderate centrilobular emphysema. Unchanged 1.1 cm spiculated nodule in the right upper lobe (series 11, image 169). Review of the MIP images confirms the above findings CTA HEAD FINDINGS Anterior circulation: The internal carotid arteries are patent from skull base to carotid termini with atherosclerotic calcification bilaterally not resulting in significant stenosis. ACAs and MCAs are patent without evidence of a proximal branch occlusion or significant proximal stenosis. No aneurysm is identified. Posterior circulation: The intracranial vertebral arteries are patent to the basilar with the left V4 segment being heavily diseased with multiple severe stenoses. There is mild-to-moderate atherosclerosis of the right V4 segment without significant stenosis. Patent PICA and SCA origins are visualized bilaterally. The basilar artery is widely patent. There is a small left posterior communicating artery. Both PCAs are patent without evidence of a significant proximal stenosis on the right. There are severe proximal and  distal left P2 stenoses. No aneurysm is identified. Venous sinuses: Absent opacification of the nondominant left sigmoid sinus, favored to be related to contrast timing and slower flow based on today's MRI rather than occlusion. Anatomic variants: None. Review of the MIP images confirms the above findings IMPRESSION: 1. No emergent large vessel occlusion. 2. Severe left P2, left V4, and left V1 stenoses. 3. Unchanged 1.1 cm spiculated right upper lobe pulmonary nodule. Pulmonary  malignancy remains a concern, and continued follow-up is recommended with chest CT versus PET-CT or tissue sampling. 4. Aortic Atherosclerosis (ICD10-I70.0) and Emphysema (ICD10-J43.9). Electronically Signed   By: Sebastian Ache M.D.   On: 03/21/2023 14:27   MR BRAIN WO CONTRAST  Result Date: 03/21/2023 CLINICAL DATA:  Neuro deficit, acute, stroke suspected. Left facial numbness beginning last night with radiation to the left arm. EXAM: MRI HEAD WITHOUT CONTRAST TECHNIQUE: Multiplanar, multiecho pulse sequences of the brain and surrounding structures were obtained without intravenous contrast. COMPARISON:  Head CT 10/15/2017 FINDINGS: Brain: There is a 3 mm acute to early subacute infarct in the right thalamus. Patchy T2 hyperintensities in the cerebral white matter and pons are nonspecific but compatible with moderate chronic small vessel ischemic disease. Asymmetric gliosis is noted in the left frontal white matter. There is mild generalized cerebral atrophy. No intracranial hemorrhage, mass, midline shift, or extra-axial fluid collection is identified. Vascular: Absent flow void in the distal left vertebral artery. Skull and upper cervical spine: Unremarkable bone marrow signal. Sinuses/Orbits: Bilateral cataract extraction. Minor mucosal thickening in the paranasal sinuses. Trace right and moderate left mastoid effusions. Other: None. IMPRESSION: 1. Punctate acute to early subacute right thalamic infarct. 2. Moderate chronic small vessel ischemic disease. 3. Absent flow void in the distal left vertebral artery which may reflect occlusion or slow flow. Electronically Signed   By: Sebastian Ache M.D.   On: 03/21/2023 09:18    Procedures Procedures    Medications Ordered in ED Medications   stroke: early stages of recovery book (has no administration in time range)  senna-docusate (Senokot-S) tablet 1 tablet (has no administration in time range)  ondansetron (ZOFRAN) tablet 4 mg (has no administration in  time range)    Or  ondansetron (ZOFRAN) injection 4 mg (has no administration in time range)  acetaminophen (TYLENOL) tablet 650 mg (has no administration in time range)  iohexol (OMNIPAQUE) 350 MG/ML injection 75 mL (75 mLs Intravenous Contrast Given 03/21/23 1357)    ED Course/ Medical Decision Making/ A&P                   NIH Stroke Scale: 1              Medical Decision Making 77 year old female with past medical history of atrial fibrillation on Eliquis, hypertension, and hyperlipidemia presenting to the emergency department today with left-sided numbness starting yesterday evening at 7:30 PM.  The patient does have some subjective numbness here on exam.  I will further evaluate her here with basic labs as well as an MRI of her brain for further evaluation for acute CVA.  The patient is clearly outside the window for thrombolytics at this time and with her Eliquis use she is not a candidate for this.  She does not have findings on exam consistent with large vessel occlusion.  I will discuss her case with neurology after her initial workup is complete to discuss ultimate disposition.  The patient was found to have a thalamic stroke.  She is evaluated by neurology.  They did recommend admission for further stroke workup.  Calls placed to hospital service for admission.  Amount and/or Complexity of Data Reviewed Labs: ordered. Radiology: ordered.  Risk Decision regarding hospitalization.           Final Clinical Impression(s) / ED Diagnoses Final diagnoses:  Cerebrovascular accident (CVA), unspecified mechanism (HCC)    Rx / DC Orders ED Discharge Orders     None         Durwin Glaze, MD 03/21/23 1528

## 2023-03-22 DIAGNOSIS — I6381 Other cerebral infarction due to occlusion or stenosis of small artery: Secondary | ICD-10-CM | POA: Diagnosis not present

## 2023-03-22 LAB — HEMOGLOBIN A1C
Hgb A1c MFr Bld: 5.6 % (ref 4.8–5.6)
Mean Plasma Glucose: 114.02 mg/dL

## 2023-03-22 LAB — BASIC METABOLIC PANEL
Anion gap: 9 (ref 5–15)
BUN: 18 mg/dL (ref 8–23)
CO2: 23 mmol/L (ref 22–32)
Calcium: 9.1 mg/dL (ref 8.9–10.3)
Chloride: 105 mmol/L (ref 98–111)
Creatinine, Ser: 0.62 mg/dL (ref 0.44–1.00)
GFR, Estimated: >60 mL/min (ref >60)
Glucose, Bld: 87 mg/dL (ref 70–99)
Potassium: 4 mmol/L (ref 3.5–5.1)
Sodium: 137 mmol/L (ref 135–145)

## 2023-03-22 LAB — LIPID PANEL
Cholesterol: 175 mg/dL (ref 0–200)
HDL: 52 mg/dL (ref >40)
LDL Cholesterol: 113 mg/dL — ABNORMAL HIGH (ref 0–99)
Total CHOL/HDL Ratio: 3.4 {ratio}
Triglycerides: 51 mg/dL (ref <150)
VLDL: 10 mg/dL (ref 0–40)

## 2023-03-22 LAB — MAGNESIUM: Magnesium: 1.8 mg/dL (ref 1.7–2.4)

## 2023-03-22 MED ORDER — ROSUVASTATIN CALCIUM 40 MG PO TABS: 40.0000 mg | ORAL_TABLET | Freq: Every day | ORAL | 2 refills | Status: DC

## 2023-03-22 MED ORDER — MAGNESIUM OXIDE -MG SUPPLEMENT 400 (240 MG) MG PO TABS
400.0000 mg | ORAL_TABLET | Freq: Every day | ORAL | Status: DC
  Filled 2023-03-22: qty 1

## 2023-03-22 MED ORDER — ROSUVASTATIN CALCIUM 20 MG PO TABS
40.0000 mg | ORAL_TABLET | Freq: Every day | ORAL | Status: DC
  Administered 2023-03-22: 40 mg via ORAL
  Filled 2023-03-22: qty 2

## 2023-03-22 NOTE — Care Management Obs Status (Signed)
MEDICARE OBSERVATION STATUS NOTIFICATION   Patient Details  Name: NICO DADA MRN: [id number] Date of Birth: [birth year]   Medicare Observation Status Notification Given:  Yes    Lawerance Sabal, RN 03/22/2023, 11:03 AM

## 2023-03-22 NOTE — Plan of Care (Signed)
  Problem: Education: Goal: Knowledge of disease or condition will improve Outcome: Progressing   Problem: Ischemic Stroke/TIA Tissue Perfusion: Goal: Complications of ischemic stroke/TIA will be minimized Outcome: Progressing   Problem: Coping: Goal: Will verbalize positive feelings about self Outcome: Progressing Goal: Will identify appropriate support needs Outcome: Progressing   Problem: Health Behavior/Discharge Planning: Goal: Goals will be collaboratively established with patient/family Outcome: Progressing   Problem: Self-Care: Goal: Ability to participate in self-care as condition permits will improve Outcome: Progressing Goal: Ability to communicate needs accurately will improve Outcome: Progressing   Problem: Education: Goal: Knowledge of General Education information will improve Description: Including pain rating scale, medication(s)/side effects and non-pharmacologic comfort measures Outcome: Progressing   Problem: Clinical Measurements: Goal: Ability to maintain clinical measurements within normal limits will improve Outcome: Progressing Goal: Will remain free from infection Outcome: Progressing Goal: Cardiovascular complication will be avoided Outcome: Progressing   Problem: Activity: Goal: Risk for activity intolerance will decrease Outcome: Progressing   Problem: Elimination: Goal: Will not experience complications related to urinary retention Outcome: Progressing   Problem: Pain Management: Goal: General experience of comfort will improve Outcome: Progressing   Problem: Safety: Goal: Ability to remain free from injury will improve Outcome: Progressing

## 2023-03-22 NOTE — Progress Notes (Signed)
PT Cancellation Note  Patient Details Name: Kristina Dennis MRN: [id number] DOB: Apr 16, 1946   Cancelled Treatment:    Reason Eval/Treat Not Completed: (P) PT screened, no needs identified, will sign off Pt up in room walking around, reports only remaining symptom is mild sensation loss in L face. Educated pt on BE FAST signs and symptoms of stroke. PT signing off.   Antwyne Pingree B. Beverely Risen PT, DPT Acute Rehabilitation Services Please use secure chat or  Call Office [phone]     [facility] 03/22/2023, 10:57 AM

## 2023-03-22 NOTE — Evaluation (Signed)
Occupational Therapy Evaluation Patient Details Name: Kristina Dennis MRN: [id number] DOB: 23-Apr-1945 Today's Date: 03/22/2023   History of Present Illness Pt is a 77 y/o F presenting to ED on 11/29 with L sided facial and LUE numbness. MRI reveals punctate/early subacute R thalamic infarct. CTA wtih severe L P2, V4, and V1 stenoses. PMH includes A fib on Eliquis, HTN, HLD, GERD, IBS, COPD   Clinical Impression   Pt reports ind at baseline with ADLs/functional mobility, lives with spouse who can assist PRN. Pt currently close to baseline, presents with sensation deficits to LUE/LLE (LLE chronic from knee surgery per pt). Pt completing ADLs without assist, mod I for transfers and bed mobility. Pt reports facial numbness has caused some spillage out of mouth when brushing her teeth. Pt presenting with impairments listed below, will follow acutely. Recommend OP OT at d/c.        If plan is discharge home, recommend the following: A little help with bathing/dressing/bathroom;Assist for transportation;Assistance with cooking/housework    Functional Status Assessment  Patient has had a recent decline in their functional status and demonstrates the ability to make significant improvements in function in a reasonable and predictable amount of time.  Equipment Recommendations  None recommended by OT    Recommendations for Other Services       Precautions / Restrictions Precautions Precautions: None Restrictions Weight Bearing Restrictions: No      Mobility Bed Mobility Overal bed mobility: Modified Independent                  Transfers Overall transfer level: Modified independent Equipment used: None                      Balance Overall balance assessment: No apparent balance deficits (not formally assessed)                                         ADL either performed or assessed with clinical judgement   ADL Overall ADL's : At baseline                                        General ADL Comments: pt performing UB/LB ADL, and toilet transfer, household distance ambulation without assist     Vision Baseline Vision/History: 1 Wears glasses Vision Assessment?: No apparent visual deficits     Perception Perception: Not tested       Praxis Praxis: Not tested       Pertinent Vitals/Pain Pain Assessment Pain Assessment: Faces Pain Score: 3  Faces Pain Scale: Hurts little more Pain Location: hand/arm from IV     Extremity/Trunk Assessment Upper Extremity Assessment Upper Extremity Assessment: Left hand dominant;LUE deficits/detail LUE Deficits / Details: WFL for BADL, reports decr sensation to LUE throughout, mainly on posterolateral aspect of LUE   Lower Extremity Assessment Lower Extremity Assessment: Defer to PT evaluation   Cervical / Trunk Assessment Cervical / Trunk Assessment: Normal   Communication Communication Communication: No apparent difficulties   Cognition Arousal: Alert Behavior During Therapy: WFL for tasks assessed/performed Overall Cognitive Status: Within Functional Limits for tasks assessed  General Comments: tangential at times     General Comments  VSS on RA    Exercises     Shoulder Instructions      Home Living Family/patient expects to be discharged to:: Private residence Living Arrangements: Spouse/significant other Available Help at Discharge: Family;Available PRN/intermittently Type of Home: House Home Access: Stairs to enter [employer] of Steps: 3   Home Layout: One level     Bathroom Shower/Tub: Runner, broadcasting/film/video: None          Prior Functioning/Environment Prior Level of Function : Independent/Modified Independent;Driving             Mobility Comments: ind ADLs Comments: ind, pt enjoyes knitting        OT Problem List: Impaired sensation      OT  Treatment/Interventions: Neuromuscular education;Therapeutic exercise;Self-care/ADL training;Therapeutic activities    OT Goals(Current goals can be found in the care plan section) Acute Rehab OT Goals Patient Stated Goal: none stated OT Goal Formulation: With patient Time For Goal Achievement: 04/05/23 Potential to Achieve Goals: Good ADL Goals Pt Will Perform Tub/Shower Transfer: Shower transfer;Independently;ambulating Pt/caregiver will Perform Home Exercise Program: Left upper extremity;With Supervision;With written HEP provided;Increased strength;Increased ROM  OT Frequency: Min 1X/week    Co-evaluation              AM-PAC OT "6 Clicks" Daily Activity     Outcome Measure Help from another person eating meals?: None Help from another person taking care of personal grooming?: None Help from another person toileting, which includes using toliet, bedpan, or urinal?: None Help from another person bathing (including washing, rinsing, drying)?: A Little Help from another person to put on and taking off regular upper body clothing?: None Help from another person to put on and taking off regular lower body clothing?: None 6 Click Score: 23   End of Session Nurse Communication: Mobility status  Activity Tolerance: Patient tolerated treatment well Patient left: in bed;with call bell/phone within reach  OT Visit Diagnosis: Muscle weakness (generalized) (M62.81)                Time: 2130-8657 OT Time Calculation (min): 27 min Charges:  OT General Charges $OT Visit: 1 Visit OT Evaluation $OT Eval Low Complexity: 1 Low OT Treatments $Therapeutic Activity: 8-22 mins  Carver Fila, OTD, OTR/L SecureChat Preferred Acute Rehab [phone]   Kadedra Vanaken K Koonce 03/22/2023, 8:26 AM

## 2023-03-22 NOTE — Discharge Summary (Signed)
Physician Discharge Summary  Kristina Dennis DGL:[id number] DOB: 1945/05/10 DOA: 03/21/2023  PCP: Elie Confer, NP  Admit date: 03/21/2023 Discharge date: 03/22/2023  Admitted From: Home Disposition: Home  Recommendations for Outpatient Follow-up:  Follow up with PCP in 1-2 weeks Will send neurology for follow-up  Home Health: N/A Equipment/Devices: N/A  Discharge Condition: Stable CODE STATUS: Full code Diet recommendation: Low-salt diet, nutritional supplements  Discharge summary:  77 year old female with history of chronic A-fib on Eliquis, hypertension, hyperlipidemia, COPD, GERD, lung tumor under surveillance was admitted overnight when she presented with acute onset of left arm and face numbness that was quickly resolved.  MRI showed punctate acute/subacute right thalamic infarct.  She has mild sensory loss on the left face.  Remained overall stable.  As per neurology recommendation, she is going home.  She will continue Eliquis.  Dose of Crestor was increased to 40 mg daily.  Blood pressures are stable.  She does have diagnosed lung nodule and is under surveillance.  Will send ambulatory referral for neurology follow-up.   Discharge Diagnoses:  Principal Problem:   Right thalamic stroke [facility])    Discharge Instructions  Discharge Instructions     Diet - low sodium heart healthy   Complete by: As directed    Increase activity slowly   Complete by: As directed       Allergies as of 03/22/2023       Reactions   Hydrocodone Nausea And Vomiting   Amlodipine    Severe all over joint pain   Azithromycin Nausea Only   Excedrin Extra Strength [aspirin-acetaminophen-caffeine] Nausea Only   Extreme nausea   Niacin Nausea And Vomiting   Niacin And Related Nausea And Vomiting   Sulfa Antibiotics Nausea And Vomiting, Other (See Comments)   Violently ill   Sulfasalazine Nausea And Vomiting   Violently ill   Latex Rash        Medication List     TAKE  these medications    acetaminophen 500 MG tablet Commonly known as: TYLENOL Take 1,000 mg by mouth once as needed for moderate pain (pain score 4-6).   Co Q 10 100 MG Caps Take 100 mg by mouth daily in the afternoon.   ELDERBERRY PO Take 1,000 mg by mouth daily in the afternoon.   Eliquis 5 MG Tabs tablet Generic drug: apixaban TAKE 1 TABLET BY MOUTH TWICE A DAY   isosorbide mononitrate 30 MG 24 hr tablet Commonly known as: IMDUR Take 1 tablet (30 mg total) by mouth daily.   Magnesium 400 MG Caps Take 400 mg by mouth daily.   metoprolol succinate 25 MG 24 hr tablet Commonly known as: TOPROL-XL Take 1 tablet (25 mg total) by mouth daily.   Omega-3 Fish Oil-Vitamin D3 1200-1000 MG-UNIT Caps Take 3 capsules by mouth daily.   Probiotic Daily Caps Take 1 capsule by mouth daily.   rosuvastatin 40 MG tablet Commonly known as: CRESTOR Take 1 tablet (40 mg total) by mouth daily. Start taking on: March 23, 2023 What changed:  medication strength how much to take   sodium chloride 0.65 % Soln nasal spray Commonly known as: OCEAN Place 1 spray into both nostrils daily as needed for congestion.   Systane Complete 0.6 % Soln Generic drug: Propylene Glycol Place 1 drop into both eyes daily as needed (Dry eye).   vitamin C 1000 MG tablet Take 1,000 mg by mouth.        Allergies  Allergen Reactions   Hydrocodone Nausea  And Vomiting   Amlodipine     Severe all over joint pain   Azithromycin Nausea Only   Excedrin Extra Strength [Aspirin-Acetaminophen-Caffeine] Nausea Only    Extreme nausea   Niacin Nausea And Vomiting   Niacin And Related Nausea And Vomiting   Sulfa Antibiotics Nausea And Vomiting and Other (See Comments)    Violently ill   Sulfasalazine Nausea And Vomiting    Violently ill   Latex Rash    Consultations: Neurology   Procedures/Studies: CT CHEST WO CONTRAST  Result Date: 03/21/2023 CLINICAL DATA:  Lung nodule. EXAM: CT CHEST WITHOUT  CONTRAST TECHNIQUE: Multidetector CT imaging of the chest was performed following the standard protocol without IV contrast. RADIATION DOSE REDUCTION: This exam was performed according to the departmental dose-optimization program which includes automated exposure control, adjustment of the mA and/or kV according to patient size and/or use of iterative reconstruction technique. COMPARISON:  CT chest 11/07/2022 FINDINGS: Cardiovascular: No significant vascular findings. Normal heart size. No pericardial effusion. There are atherosclerotic calcifications of the aorta and coronary arteries. Mediastinum/Nodes: No enlarged mediastinal or axillary lymph nodes. Thyroid gland, trachea, and esophagus demonstrate no significant findings. Lungs/Pleura: Pleural-parenchymal scarring in the lung apices appears unchanged. Mild emphysema again noted. Spiculated nodule in the right upper lobe measuring 1.1 by 5.9 cm with surrounding ground-glass opacity appears unchanged from prior. There are scattered calcified granulomas. There is a 4 mm nodule in the left upper lobe image 3/58 which is unchanged from prior. There is no lung consolidation, pleural effusion or pneumothorax. Upper Abdomen: No acute abnormality. Musculoskeletal: No chest wall mass or suspicious bone lesions identified. IMPRESSION: 1. Stable spiculated nodule in the right upper lobe with surrounding ground-glass opacity. Findings are worrisome for malignancy. Consider one of the following in 3 months for both low-risk and high-risk individuals: (a) repeat chest CT, (b) follow-up PET-CT, or (c) tissue sampling. This recommendation follows the consensus statement: Guidelines for Management of Incidental Pulmonary Nodules Detected on CT Images: From the Fleischner Society 2017; Radiology 2017; 284:228-243. 2. Stable 4 mm nodule in the left upper lobe. Aortic Atherosclerosis (ICD10-I70.0) and Emphysema (ICD10-J43.9). Electronically Signed   By: Darliss Cheney M.D.   On:  03/21/2023 23:20   CT ANGIO HEAD NECK W WO CM  Result Date: 03/21/2023 CLINICAL DATA:  Stroke/TIA, determine embolic source. Numbness. Right thalamic infarct on MRI. EXAM: CT ANGIOGRAPHY HEAD AND NECK WITH AND WITHOUT CONTRAST TECHNIQUE: Multidetector CT imaging of the head and neck was performed using the standard protocol during bolus administration of intravenous contrast. Multiplanar CT image reconstructions and MIPs were obtained to evaluate the vascular anatomy. Carotid stenosis measurements (when applicable) are obtained utilizing NASCET criteria, using the distal internal carotid diameter as the denominator. RADIATION DOSE REDUCTION: This exam was performed according to the departmental dose-optimization program which includes automated exposure control, adjustment of the mA and/or kV according to patient size and/or use of iterative reconstruction technique. CONTRAST:  75mL OMNIPAQUE IOHEXOL 350 MG/ML SOLN COMPARISON:  Head MRI 03/21/2023.  Chest CT 11/07/2022. FINDINGS: CT HEAD FINDINGS Brain: The punctate right thalamic infarct on MRI is not apparent by CT. Patchy hypodensities in the cerebral white matter nonspecific but compatible with moderate chronic small vessel ischemic disease. There is mild cerebral atrophy. Vascular: Calcified atherosclerosis at the skull base. Skull: No acute fracture or suspicious osseous lesion. Sinuses/Orbits: Mild mucosal thickening in the left maxillary sinus. Left mastoid effusion. Bilateral cataract extraction. Other: None. Review of the MIP images confirms the above findings CTA NECK  FINDINGS Aortic arch: Standard 3 vessel aortic arch with mild atherosclerosis. No significant stenosis of the arch vessel origins. Right carotid system: Patent with a small to moderate amount of calcified plaque at the carotid bifurcation. No evidence of a significant stenosis or dissection. Left carotid system: Patent with a moderate amount calcified plaque at the carotid bifurcation.  No evidence of a significant stenosis or dissection. Vertebral arteries: The right vertebral artery is widely patent and strongly dominant. The left vertebral artery is patent in the neck and diffusely small with a severe stenosis at its origin. Skeleton: Cervical disc degeneration which is most advanced at C5-6 and C6-7. Focal kyphosis centered at C4-5 where there is grade 1 anterolisthesis. Other neck: No evidence of cervical lymphadenopathy or mass. Upper chest: Biapical lung scarring and moderate centrilobular emphysema. Unchanged 1.1 cm spiculated nodule in the right upper lobe (series 11, image 169). Review of the MIP images confirms the above findings CTA HEAD FINDINGS Anterior circulation: The internal carotid arteries are patent from skull base to carotid termini with atherosclerotic calcification bilaterally not resulting in significant stenosis. ACAs and MCAs are patent without evidence of a proximal branch occlusion or significant proximal stenosis. No aneurysm is identified. Posterior circulation: The intracranial vertebral arteries are patent to the basilar with the left V4 segment being heavily diseased with multiple severe stenoses. There is mild-to-moderate atherosclerosis of the right V4 segment without significant stenosis. Patent PICA and SCA origins are visualized bilaterally. The basilar artery is widely patent. There is a small left posterior communicating artery. Both PCAs are patent without evidence of a significant proximal stenosis on the right. There are severe proximal and distal left P2 stenoses. No aneurysm is identified. Venous sinuses: Absent opacification of the nondominant left sigmoid sinus, favored to be related to contrast timing and slower flow based on today's MRI rather than occlusion. Anatomic variants: None. Review of the MIP images confirms the above findings IMPRESSION: 1. No emergent large vessel occlusion. 2. Severe left P2, left V4, and left V1 stenoses. 3. Unchanged  1.1 cm spiculated right upper lobe pulmonary nodule. Pulmonary malignancy remains a concern, and continued follow-up is recommended with chest CT versus PET-CT or tissue sampling. 4. Aortic Atherosclerosis (ICD10-I70.0) and Emphysema (ICD10-J43.9). Electronically Signed   By: Sebastian Ache M.D.   On: 03/21/2023 14:27   MR BRAIN WO CONTRAST  Result Date: 03/21/2023 CLINICAL DATA:  Neuro deficit, acute, stroke suspected. Left facial numbness beginning last night with radiation to the left arm. EXAM: MRI HEAD WITHOUT CONTRAST TECHNIQUE: Multiplanar, multiecho pulse sequences of the brain and surrounding structures were obtained without intravenous contrast. COMPARISON:  Head CT 10/15/2017 FINDINGS: Brain: There is a 3 mm acute to early subacute infarct in the right thalamus. Patchy T2 hyperintensities in the cerebral white matter and pons are nonspecific but compatible with moderate chronic small vessel ischemic disease. Asymmetric gliosis is noted in the left frontal white matter. There is mild generalized cerebral atrophy. No intracranial hemorrhage, mass, midline shift, or extra-axial fluid collection is identified. Vascular: Absent flow void in the distal left vertebral artery. Skull and upper cervical spine: Unremarkable bone marrow signal. Sinuses/Orbits: Bilateral cataract extraction. Minor mucosal thickening in the paranasal sinuses. Trace right and moderate left mastoid effusions. Other: None. IMPRESSION: 1. Punctate acute to early subacute right thalamic infarct. 2. Moderate chronic small vessel ischemic disease. 3. Absent flow void in the distal left vertebral artery which may reflect occlusion or slow flow. Electronically Signed   By: Sebastian Ache  M.D.   On: 03/21/2023 09:18   (Echo, Carotid, EGD, Colonoscopy, ERCP)    Subjective: Patient seen and examined.  Dressed up and getting ready to go home.  Denies any complaints.   Discharge Exam: Vitals:   03/22/23 0355 03/22/23 0950  BP: 109/68  129/86  Pulse: (!) 57 66  Resp: 19 16  Temp: 97.7 F (36.5 C) 97.9 F (36.6 C)  SpO2: 98% 98%   Vitals:   03/21/23 1932 03/21/23 2346 03/22/23 0355 03/22/23 0950  BP: 115/64 126/78 109/68 129/86  Pulse: (!) 53 (!) 58 (!) 57 66  Resp: 18 18 19 16   Temp: 98.1 F (36.7 C) 98.8 F (37.1 C) 97.7 F (36.5 C) 97.9 F (36.6 C)  TempSrc: Oral Oral Oral Oral  SpO2: 100% 98% 98% 98%  Weight:      Height:        General: Pt is alert, awake, not in acute distress Cardiovascular: RRR, S1/S2 +, no rubs, no gallops Respiratory: CTA bilaterally, no wheezing, no rhonchi Abdominal: Soft, NT, ND, bowel sounds + Extremities: no edema, no cyanosis    The results of significant diagnostics from this hospitalization (including imaging, microbiology, ancillary and laboratory) are listed below for reference.     Microbiology: No results found for this or any previous visit (from the past 240 hour(s)).   Labs: BNP (last 3 results) No results for input(s): "BNP" in the last 8760 hours. Basic Metabolic Panel: Recent Labs  Lab 03/21/23 0824 03/22/23 0419  NA 136 137  K 3.5 4.0  CL 100 105  CO2 27 23  GLUCOSE 83 87  BUN 16 18  CREATININE 0.49 0.62  CALCIUM 9.6 9.1  MG  --  1.8   Liver Function Tests: No results for input(s): "AST", "ALT", "ALKPHOS", "BILITOT", "PROT", "ALBUMIN" in the last 168 hours. No results for input(s): "LIPASE", "AMYLASE" in the last 168 hours. No results for input(s): "AMMONIA" in the last 168 hours. CBC: Recent Labs  Lab 03/21/23 0824  WBC 8.1  HGB 13.8  HCT 42.8  MCV 93.2  PLT 287   Cardiac Enzymes: No results for input(s): "CKTOTAL", "CKMB", "CKMBINDEX", "TROPONINI" in the last 168 hours. BNP: Invalid input(s): "POCBNP" CBG: No results for input(s): "GLUCAP" in the last 168 hours. D-Dimer No results for input(s): "DDIMER" in the last 72 hours. Hgb A1c Recent Labs    03/22/23 0419  HGBA1C 5.6   Lipid Profile Recent Labs     03/22/23 0419  CHOL 175  HDL 52  LDLCALC 113*  TRIG 51  CHOLHDL 3.4   Thyroid function studies No results for input(s): "TSH", "T4TOTAL", "T3FREE", "THYROIDAB" in the last 72 hours.  Invalid input(s): "FREET3" Anemia work up No results for input(s): "VITAMINB12", "FOLATE", "FERRITIN", "TIBC", "IRON", "RETICCTPCT" in the last 72 hours. Urinalysis    Component Value Date/Time   COLORURINE YELLOW 06/16/2014 0346   APPEARANCEUR CLEAR 06/16/2014 0346   LABSPEC 1.016 06/16/2014 0346   PHURINE 5.0 06/16/2014 0346   GLUCOSEU NEGATIVE 06/16/2014 0346   HGBUR SMALL (A) 06/16/2014 0346   BILIRUBINUR NEGATIVE 06/16/2014 0346   KETONESUR NEGATIVE 06/16/2014 0346   PROTEINUR NEGATIVE 06/16/2014 0346   UROBILINOGEN 0.2 06/16/2014 0346   NITRITE NEGATIVE 06/16/2014 0346   LEUKOCYTESUR NEGATIVE 06/16/2014 0346   Sepsis Labs Recent Labs  Lab 03/21/23 0824  WBC 8.1   Microbiology No results found for this or any previous visit (from the past 240 hour(s)).   Time coordinating discharge: 35 minutes  SIGNED:  Dorcas Carrow, MD  [facility] 03/22/2023, 10:33 AM

## 2023-03-22 NOTE — Care Management (Signed)
Spoke w patient and spouse. They are agreeable to OP OT as recommended by therapist. Referral placed to [facility], added to AVS

## 2023-03-22 NOTE — Progress Notes (Addendum)
STROKE TEAM PROGRESS NOTE   BRIEF HPI [name] is a 77 y.o. female with history of A-fib on Eliquis, hypertension, hyperlipidemia, COPD, GERD, IBS presenting with left arm and face numbness.   NIH on Admission 1   SIGNIFICANT HOSPITAL EVENTS 11/29 MRI brain with punctate acute/subacute right thalamic infarct  INTERIM HISTORY/SUBJECTIVE Patient was admitted for stroke workup due to MRI with acute/subacute punctate and right thalamic lacunar infarct Patient states that she is much improved except for some decrease sensation  on left face  CT chest revealed right upper lobe nodule she has been undergoing workup for concern for malignancy.  At this time would continue Eliquis, if the lung nodule turns out to be cancerous then could consider changing to warfarin  OBJECTIVE  CBC    Component Value Date/Time   WBC 8.1 03/21/2023 0824   RBC 4.59 03/21/2023 0824   HGB 13.8 03/21/2023 0824   HGB 12.7 10/22/2022 1517   HCT 42.8 03/21/2023 0824   HCT 36.8 10/22/2022 1517   PLT 287 03/21/2023 0824   PLT 299 10/22/2022 1517   MCV 93.2 03/21/2023 0824   MCV 89 10/22/2022 1517   MCH 30.1 03/21/2023 0824   MCHC 32.2 03/21/2023 0824   RDW 13.3 03/21/2023 0824   RDW 13.1 10/22/2022 1517   LYMPHSABS 2.5 01/25/2021 1559   MONOABS 0.8 01/25/2021 1559   EOSABS 0.1 01/25/2021 1559   BASOSABS 0.1 01/25/2021 1559    BMET    Component Value Date/Time   NA 137 03/22/2023 0419   NA 139 10/22/2022 1517   K 4.0 03/22/2023 0419   CL 105 03/22/2023 0419   CO2 23 03/22/2023 0419   GLUCOSE 87 03/22/2023 0419   BUN 18 03/22/2023 0419   BUN 27 10/22/2022 1517   CREATININE 0.62 03/22/2023 0419   CALCIUM 9.1 03/22/2023 0419   EGFR 91 10/22/2022 1517   GFRNONAA >60 03/22/2023 0419    IMAGING past 24 hours CT CHEST WO CONTRAST  Result Date: 03/21/2023 CLINICAL DATA:  Lung nodule. EXAM: CT CHEST WITHOUT CONTRAST TECHNIQUE: Multidetector CT imaging of the chest was performed  following the standard protocol without IV contrast. RADIATION DOSE REDUCTION: This exam was performed according to the departmental dose-optimization program which includes automated exposure control, adjustment of the mA and/or kV according to patient size and/or use of iterative reconstruction technique. COMPARISON:  CT chest 11/07/2022 FINDINGS: Cardiovascular: No significant vascular findings. Normal heart size. No pericardial effusion. There are atherosclerotic calcifications of the aorta and coronary arteries. Mediastinum/Nodes: No enlarged mediastinal or axillary lymph nodes. Thyroid gland, trachea, and esophagus demonstrate no significant findings. Lungs/Pleura: Pleural-parenchymal scarring in the lung apices appears unchanged. Mild emphysema again noted. Spiculated nodule in the right upper lobe measuring 1.1 by 5.9 cm with surrounding ground-glass opacity appears unchanged from prior. There are scattered calcified granulomas. There is a 4 mm nodule in the left upper lobe image 3/58 which is unchanged from prior. There is no lung consolidation, pleural effusion or pneumothorax. Upper Abdomen: No acute abnormality. Musculoskeletal: No chest wall mass or suspicious bone lesions identified. IMPRESSION: 1. Stable spiculated nodule in the right upper lobe with surrounding ground-glass opacity. Findings are worrisome for malignancy. Consider one of the following in 3 months for both low-risk and high-risk individuals: (a) repeat chest CT, (b) follow-up PET-CT, or (c) tissue sampling. This recommendation follows the consensus statement: Guidelines for Management of Incidental Pulmonary Nodules Detected on CT Images: From the Fleischner Society 2017; Radiology 2017; 284:228-243. 2.  Stable 4 mm nodule in the left upper lobe. Aortic Atherosclerosis (ICD10-I70.0) and Emphysema (ICD10-J43.9). Electronically Signed   By: Darliss Cheney M.D.   On: 03/21/2023 23:20   CT ANGIO HEAD NECK W WO CM  Result Date:  03/21/2023 CLINICAL DATA:  Stroke/TIA, determine embolic source. Numbness. Right thalamic infarct on MRI. EXAM: CT ANGIOGRAPHY HEAD AND NECK WITH AND WITHOUT CONTRAST TECHNIQUE: Multidetector CT imaging of the head and neck was performed using the standard protocol during bolus administration of intravenous contrast. Multiplanar CT image reconstructions and MIPs were obtained to evaluate the vascular anatomy. Carotid stenosis measurements (when applicable) are obtained utilizing NASCET criteria, using the distal internal carotid diameter as the denominator. RADIATION DOSE REDUCTION: This exam was performed according to the departmental dose-optimization program which includes automated exposure control, adjustment of the mA and/or kV according to patient size and/or use of iterative reconstruction technique. CONTRAST:  75mL OMNIPAQUE IOHEXOL 350 MG/ML SOLN COMPARISON:  Head MRI 03/21/2023.  Chest CT 11/07/2022. FINDINGS: CT HEAD FINDINGS Brain: The punctate right thalamic infarct on MRI is not apparent by CT. Patchy hypodensities in the cerebral white matter nonspecific but compatible with moderate chronic small vessel ischemic disease. There is mild cerebral atrophy. Vascular: Calcified atherosclerosis at the skull base. Skull: No acute fracture or suspicious osseous lesion. Sinuses/Orbits: Mild mucosal thickening in the left maxillary sinus. Left mastoid effusion. Bilateral cataract extraction. Other: None. Review of the MIP images confirms the above findings CTA NECK FINDINGS Aortic arch: Standard 3 vessel aortic arch with mild atherosclerosis. No significant stenosis of the arch vessel origins. Right carotid system: Patent with a small to moderate amount of calcified plaque at the carotid bifurcation. No evidence of a significant stenosis or dissection. Left carotid system: Patent with a moderate amount calcified plaque at the carotid bifurcation. No evidence of a significant stenosis or dissection. Vertebral  arteries: The right vertebral artery is widely patent and strongly dominant. The left vertebral artery is patent in the neck and diffusely small with a severe stenosis at its origin. Skeleton: Cervical disc degeneration which is most advanced at C5-6 and C6-7. Focal kyphosis centered at C4-5 where there is grade 1 anterolisthesis. Other neck: No evidence of cervical lymphadenopathy or mass. Upper chest: Biapical lung scarring and moderate centrilobular emphysema. Unchanged 1.1 cm spiculated nodule in the right upper lobe (series 11, image 169). Review of the MIP images confirms the above findings CTA HEAD FINDINGS Anterior circulation: The internal carotid arteries are patent from skull base to carotid termini with atherosclerotic calcification bilaterally not resulting in significant stenosis. ACAs and MCAs are patent without evidence of a proximal branch occlusion or significant proximal stenosis. No aneurysm is identified. Posterior circulation: The intracranial vertebral arteries are patent to the basilar with the left V4 segment being heavily diseased with multiple severe stenoses. There is mild-to-moderate atherosclerosis of the right V4 segment without significant stenosis. Patent PICA and SCA origins are visualized bilaterally. The basilar artery is widely patent. There is a small left posterior communicating artery. Both PCAs are patent without evidence of a significant proximal stenosis on the right. There are severe proximal and distal left P2 stenoses. No aneurysm is identified. Venous sinuses: Absent opacification of the nondominant left sigmoid sinus, favored to be related to contrast timing and slower flow based on today's MRI rather than occlusion. Anatomic variants: None. Review of the MIP images confirms the above findings IMPRESSION: 1. No emergent large vessel occlusion. 2. Severe left P2, left V4, and left V1 stenoses.  3. Unchanged 1.1 cm spiculated right upper lobe pulmonary nodule. Pulmonary  malignancy remains a concern, and continued follow-up is recommended with chest CT versus PET-CT or tissue sampling. 4. Aortic Atherosclerosis (ICD10-I70.0) and Emphysema (ICD10-J43.9). Electronically Signed   By: Sebastian Ache M.D.   On: 03/21/2023 14:27    Vitals:   03/21/23 1932 03/21/23 2346 03/22/23 0355 03/22/23 0950  BP: 115/64 126/78 109/68 129/86  Pulse: (!) 53 (!) 58 (!) 57 66  Resp: 18 18 19 16   Temp: 98.1 F (36.7 C) 98.8 F (37.1 C) 97.7 F (36.5 C) 97.9 F (36.6 C)  TempSrc: Oral Oral Oral Oral  SpO2: 100% 98% 98% 98%  Weight:      Height:         PHYSICAL EXAM General: Frail elderly Caucasian lady in no acute distress Psych:  Mood and affect appropriate for situation CV: Regular rate and rhythm on monitor Respiratory:  Regular, unlabored respirations on room air GI: Abdomen soft and nontender   NEURO:  Mental Status: AA&Ox3, patient is able to give clear and coherent history.  HOH Speech/Language: speech is without dysarthria or aphasia.  Naming, repetition, fluency, and comprehension intact.  Cranial Nerves:  II: PERRL. Visual fields full.  III, IV, VI: EOMI. Eyelids elevate symmetrically.  V: Sensation on left face VII: Face is symmetrical resting and smiling VIII: hearing intact to voice. IX, X: Palate elevates symmetrically. Phonation is normal.  DG:[id number] shrug 5/5. XII: tongue is midline without fasciculations. Motor: 5/5 strength to all muscle groups tested.  Tone: is normal and bulk is normal Sensation- Intact to light touch bilaterally. Extinction absent to light touch to DSS.  Subjective numbness in the left cheek. Coordination: FTN intact bilaterally, HKS: no ataxia in BLE.No drift.  Gait- deferred  Most Recent NIH  1a Level of Conscious.: 0 1b LOC Questions: 0 1c LOC Commands: 0 2 Best Gaze: 0 3 Visual: 0 4 Facial Palsy: 0 5a Motor Arm - left: 0 5b Motor Arm - Right: 0 6a Motor Leg - Left: 0 6b Motor Leg - Right: 0 7 Limb Ataxia:  0 8 Sensory: 1 9 Best Language: 0 10 Dysarthria:  11 Extinct. and Inatten.: 0 TOTAL: 1   ASSESSMENT/PLAN  Acute Ischemic Infarct:  right thalamic infarct  Etiology: small vessel disease likely but she is also at risk for cardioembolism in the setting of A-fib on Eliquis versus hypercoagulable state from suspected lung cancer CTA head & neck no LVO. Severe left P2, left V4, and left V1 stenoses  MRI  Punctate acute to early subacute right thalamic infarct. Moderate chronic small vessel ischemic disease. 2D Echo on 11/16/22-EF 55 to 60%.  LV with grade 1 diastolic dysfunction LDL 113 HgbA1c 5.6 VTE prophylaxis -anticoagulated with Eliquis Eliquis prior to admission, now on Eliquis Therapy recommendations:  Pending Disposition: Pending   Atrial fibrillation Home Meds: Eliquis Continue telemetry monitoring Continue anticoagulation with Eliquis.  If lung nodule is positive for malignancy can consider switching to warfarin  Hypertension Home meds: Imdur, metoprolol, Stable Blood Pressure Goal: BP less than 220/110   Hyperlipidemia Home meds: Crestor 10 mg, resumed in hospital LDL 113, goal < 70 Increased to 40 mg Crestor Continue statin at discharge  Pulmonary nodule Workup has been underway since July and is suspicious for malignancy followed with pulmonology If nodule reveals malignancy can consider switching Eliquis to Coumadin at that time  Dysphagia Patient has post-stroke dysphagia, SLP consulted    Diet   Diet regular Room service  appropriate? Yes; Fluid consistency: Thin   Advance diet as tolerated  Other Stroke Risk Factors    Other Active Problems   Hospital day # 1 Gevena Mart DNP, [id number]-AG  [facility]  STROKE MD NOTE :  I have personally obtained history,examined this patient, reviewed notes, independently viewed imaging studies, participated in medical decision making and plan of care.ROS completed by me personally and pertinent  positives fully documented  I have made any additions or clarifications directly to the above note. Agree with note above.  Patient presented with sudden onset of paresthesias involving left cheek, left hand and feet.  She is obtaining a complete resolution of his symptoms and MRI scan shows a punctate small right thalamic lacunar infarct likely from small vessel disease even though she is at risk for cardiogenic embolism from her A-fib despite being on anticoagulation with Eliquis and now new concerns about possible lung cancer could also make her hypercoagulable.  I had a long discussion with patient about alternatives to Eliquis but there is no Tomoko, suggesting switching Eliquis to Pradaxa or Xarelto as necessary still.  Close stroke likely being from small vessel disease I will recommend to continue Eliquis for now.  If in the future diagnosis of lung cancer is confirmed may consider switching to warfarin.  Continue ongoing stroke workup.  Patient will be discharged home follow-up as an outpatient in 2 months.  Long discussion patient and Dr.Ghimire and answered questions.  Greater than 50% time during this 50-minute visit was spent in counseling and coordination of care about a lacunar infarct as well as discussion about stroke evaluation and prevention and treatment and answering questions.  Delia Heady, MD Medical Director [facility] Pager: [phone] 03/22/2023 2:10 PM    To contact Stroke Continuity provider, please refer to [email or web address]. After hours, contact General Neurology

## 2023-03-22 NOTE — Care Management CC44 (Signed)
Condition Code 44 Documentation Completed  Patient Details  Name: Kristina Dennis MRN: [id number] Date of Birth: [birth year]   Condition Code 44 given:  Yes Patient signature on Condition Code 44 notice:  Yes Documentation of 2 MD's agreement:  Yes Code 44 added to claim:  Yes    Lawerance Sabal, RN 03/22/2023, 11:03 AM

## 2023-03-29 ENCOUNTER — Encounter: Payer: Self-pay | Admitting: Cardiovascular Disease

## 2023-03-29 NOTE — Progress Notes (Unsigned)
Cardiology Office Note:    Date:  03/31/2023   ID:  Kristina Dennis, Kristina Dennis 02/06/1946, MRN [id number]  PCP:  Kristina Confer, NP  Cardiologist:  Kristina Miss, MD  Electrophysiologist:  None   Referring MD: Kristina Confer, NP   Chief Complaint  Patient presents with   Atrial Fibrillation      Feb. 10, 2020    Kristina Dennis is a 77 y.o. female with a hx of  HTN.   I met Kristina Dennis in 2012 and have not seen her since then.  She was having issues with palpitations We were asked by Dr. Evlyn Dennis to see her coronary artery calcifications that were noted incidentally on a chest CT from 03-Nov-2017.  No hx of CAD ( father died of a brain aneurism, mother died of a broken neck )   Other members died of accidents and cancer.  She denies having any episodes of chest pain.  She does exercise on a regular basis  ( physical therapy and light weights )    Does not do cardio exercise .   Has foot issues/   Takes amlodipine on a PRN basis  - ends up taking it every week or 2 .   January 30, 2021: Kristina Dennis is seen today for follow-up of her hypertension.  She apparently was found to have atrial fibrillation.  On October 6 she developed palpitations.  She presented to the emergency room and was found to have atrial fibrillation with rapid ventricular response.  Episode resolved on its own. Troponin level was 70 and then increased to 137.  Episode lasted for 1-2 hours.  No CP , no dyspnea Associated with some left arm ache. No chest ache.  She had some palpitations in 2020, Myoivew was normal at time  No cardiac Does Tai chi  October 01, 2021 Kristina Dennis is seen for follow up of her PAF, HTN. Having issues with lots of fatigue  Is exercising .   Rides a stationary bike 3 days a week .   Is frustrated about having the dx of Atrial fib Says she "waits every day to see if she is going to die or have a stroke" Doesn't like taking the Eliquis because this reminds her that she could die at any  minute.  Seems to have lots of anxiety over her condition   December 25 2021: Kristina Dennis is seen today for follow-up of her paroxysmal atrial fibrillation, hypertension. Has lots of issues with anxiety   Has been exercising  some -  Is feeling well    Oct. 9, 2023   Kristina Dennis is seen today for follow up of her atrial fib.    Was having a sore throat.   Her primary MD sent her to the ER. She had evaluation,  was walking out of the ER.   Developed atrial fib .   She walked back into the ER  She converted back to sinus rhythm while she was there. Was started on prednisone  Has not slept wall while on prednisone    Has lots of issues with anxiety  Is tolerating the eliquis  Exercises  Respiratory panel was negative for COVID   Is maintaining NSR .  Dec. 9, 2024 Kristina Dennis is seen for follow up of her PAF  Had a stroke Has numbness of the left side of her body  Has an appt to see neuro in Feb.  Has also affected her hearing and taste   MRI reveals punctate  right thalamic infarct, which usually explains patient's symptoms   No loss of balance   Was seen by Dr. Derry Dennis ( neuro) in the hospital  Neuro considered adding ASA ( according to [facility]) but did not add it          Past Medical History:  Diagnosis Date   Arthritis    Colitis 2003   per path report from 2003: non-specific.  normal colonoscopy in 04/2011.    GERD (gastroesophageal reflux disease)    Hypertension    Irritable bowel syndrome    Osteoporosis    Palpitations    Pneumonia    hx   Rhinitis     Past Surgical History:  Procedure Laterality Date   BUNIONECTOMY Left    COLONOSCOPY     DILATION AND CURETTAGE OF UTERUS     ESOPHAGOGASTRODUODENOSCOPY  2021   KNEE SURGERY Right    arthroscopy   LEFT HEART CATH AND CORONARY ANGIOGRAPHY N/A 10/25/2022   Procedure: LEFT HEART CATH AND CORONARY ANGIOGRAPHY;  Surgeon: Swazil[place], Peter M, MD;  [facility] INVASIVE CV LAB;  Service: Cardiovascular;   Laterality: N/A;   ORIF WRIST FRACTURE Left 12/13/2013   Procedure: OPEN REDUCTION INTERNAL FIXATION (ORIF) WRIST FRACTURE;  Surgeon: Cammy Copa, MD;  Location[facility];  Service: Orthopedics;  Laterality: Left;   TONSILLECTOMY     TUBAL LIGATION      Current Medications: Current Meds  Medication Sig   acetaminophen (TYLENOL) 500 MG tablet Take 1,000 mg by mouth once as needed for moderate pain (pain score 4-6).   Ascorbic Acid (VITAMIN C) 1000 MG tablet Take 500 mg by mouth daily.   Coenzyme Q10 (CO Q 10) 100 MG CAPS Take 100 mg by mouth daily in the afternoon.   ELDERBERRY PO Take 1,000 mg by mouth daily in the afternoon.   ELIQUIS 5 MG TABS tablet TAKE 1 TABLET BY MOUTH TWICE A DAY   Fish Oil-Cholecalciferol (OMEGA-3 FISH OIL-VITAMIN D3) 1200-1000 MG-UNIT CAPS Take 3 capsules by mouth daily.   isosorbide mononitrate (IMDUR) 30 MG 24 hr tablet Take 1 tablet (30 mg total) by mouth daily.   Magnesium 400 MG CAPS Take 400 mg by mouth daily.   metoprolol succinate (TOPROL-XL) 25 MG 24 hr tablet Take 1 tablet (25 mg total) by mouth daily.   Probiotic Product (PROBIOTIC DAILY) CAPS Take 1 capsule by mouth daily.   Propylene Glycol (SYSTANE COMPLETE) 0.6 % SOLN Place 1 drop into both eyes daily as needed (Dry eye).   rosuvastatin (CRESTOR) 40 MG tablet Take 1 tablet (40 mg total) by mouth daily.   sodium chloride (OCEAN) 0.65 % SOLN nasal spray Place 1 spray into both nostrils daily as needed for congestion.     Allergies:   Hydrocodone, Amlodipine, Azithromycin, Excedrin extra strength [aspirin-acetaminophen-caffeine], Niacin, Niacin and related, Sulfa antibiotics, Sulfasalazine, and Latex   Social History   Socioeconomic History   Marital status: Married    Spouse name: Not on file   Number of children: 2   Years of education: Not on file   Highest education level: Not on file  Occupational History   Occupation: Retired  Tobacco Use   Smoking status: Former    Current  packs/day: 0.00    Average packs/day: 0.3 packs/day for 50.0 years (12.5 ttl pk-yrs)    Types: Cigarettes    Start date: 53    Quit date: 2020    Years since quitting: 4.9   Smokeless tobacco: Never  Tobacco comments:    Patient smokes 5 cigarettes a day  Vaping Use   Vaping status: Never Used  Substance and Sexual Activity   Alcohol use: No    Alcohol/week: 0.0 standard drinks of alcohol   Drug use: No   Sexual activity: Not on file  Other Topics Concern   Not on file  Social History Narrative   Married to [place]-   2 children   Retired   Former smoker no alcohol tobacco or drug use currently   Social Determinants of Corporate investment banker Strain: Not on file  Food Insecurity: No Food Insecurity (03/21/2023)   Hunger Vital Sign    Worried About Running Out of Food in the Last Year: Never true    Ran Out of Food in the Last Year: Never true  Transportation Needs: No Transportation Needs (03/21/2023)   PRAPARE - Administrator, Civil Service (Medical): No    Lack of Transportation (Non-Medical): No  Physical Activity: Not on file  Stress: Not on file  Social Connections: Unknown (09/03/2021)   Received from [facility]   Social Network    Social Network: Not on file     Family History: The patient's family history includes Arrhythmia in her mother; Cancer in her sister; Colon cancer (age of onset: 65) in her sister; Hypertension in her father; Rectal cancer (age of onset: 37) in her sister. There is no history of Pancreatic cancer, Esophageal cancer, or Stomach cancer.  ROS:   Please see the history of present illness.     All other systems reviewed and are negative.  EKGs/Labs/Other Studies Reviewed:    The following studies were reviewed today:   EKG:          Recent Labs: 01/29/2023: ALT 18 03/21/2023: Hemoglobin 13.8; Platelets 287 03/22/2023: BUN 18; Creatinine, Ser 0.62; Magnesium 1.8; Potassium 4.0; Sodium 137   Recent Lipid Panel    Component Value Date/Time   CHOL 175 03/22/2023 0419   CHOL 194 01/29/2023 0953   TRIG 51 03/22/2023 0419   HDL 52 03/22/2023 0419   HDL 55 01/29/2023 0953   CHOLHDL 3.4 03/22/2023 0419   VLDL 10 03/22/2023 0419   LDLCALC 113 (H) 03/22/2023 0419   LDLCALC 123 (H) 01/29/2023 0953    Physical Exam:     Physical Exam: Blood pressure 120/62, pulse 62, height 5\' 6"  (1.676 m), weight 91 lb 12.8 oz (41.6 kg), last menstrual period 05/03/1987, SpO2 98%.       GEN:  Well nourished, well developed in no acute distress HEENT: Normal NECK: No JVD; No carotid bruits LYMPHATICS: No lymphadenopathy CARDIAC: RRR , very soft systolic murmur  RESPIRATORY:  Clear to auscultation without rales, wheezing or rhonchi  ABDOMEN: Soft, non-tender, non-distended MUSCULOSKELETAL:  No edema; No deformity  SKIN: Warm and dry NEUROLOGIC:  Alert and oriented x 3    ASSESSMENT:    No diagnosis found.      PLAN:       -   Thalamic stroke  She was admitted to the hospital around Thanksgiving with a thalamic stroke.  She has recovered fairly well. The neuroteam apparently considered adding aspirin to her Eliquis but eventually decided not to add aspirin.  TEE was not requested and not performed.  At this point I do not have any other suggestions.  Cardiology would be available to perform a transesophageal echo if requested by neurology.  -  atrial fib - remains in NSR  Continue eliquis    -Anxiety:       Medication Adjustments/Labs and Tests Ordered: Current medicines are reviewed at length with the patient today.  Concerns regarding medicines are outlined above.  No orders of the defined types were placed in this encounter.   No orders of the defined types were placed in this encounter.     Patient Instructions  Medication Instructions:  The current medical regimen is effective;  continue present plan and medications.  *If you need a refill on your  cardiac medications before your next appointment, please call your pharmacy*  Follow-Up: At [facility], you and your health needs are our priority.  As part of our continuing mission to provide you with exceptional heart care, we have created designated Provider Care Teams.  These Care Teams include your primary Cardiologist (physician) and Advanced Practice Providers (APPs -  Physician Assistants and Nurse Practitioners) who all work together to provide you with the care you need, when you need it.  We recommend signing up for the patient portal called "MyChart".  Sign up information is provided on this After Visit Summary.  MyChart is used to connect with patients for Virtual Visits (Telemedicine).  Patients are able to view lab/test results, encounter notes, upcoming appointments, etc.  Non-urgent messages can be sent to your provider as well.   To learn more about what you can do with MyChart, go to [email or web address].    Your next appointment:   1 year(s)  Provider:   Dr Tonny Bollman       Signed, Kristina Miss, MD  03/31/2023 10:36 AM    [facility]

## 2023-03-31 ENCOUNTER — Ambulatory Visit: Payer: Medicare HMO | Attending: Cardiovascular Disease | Admitting: Cardiovascular Disease

## 2023-03-31 ENCOUNTER — Encounter: Payer: Self-pay | Admitting: Cardiovascular Disease

## 2023-03-31 VITALS — BP 120/62 | HR 62 | Ht 66.0 in | Wt 91.8 lb

## 2023-03-31 DIAGNOSIS — I480 Paroxysmal atrial fibrillation: Secondary | ICD-10-CM | POA: Diagnosis not present

## 2023-03-31 DIAGNOSIS — I6381 Other cerebral infarction due to occlusion or stenosis of small artery: Secondary | ICD-10-CM | POA: Diagnosis not present

## 2023-03-31 NOTE — Patient Instructions (Signed)
Medication Instructions:  The current medical regimen is effective;  continue present plan and medications.  *If you need a refill on your cardiac medications before your next appointment, please call your pharmacy*  Follow-Up: At [facility], you and your health needs are our priority.  As part of our continuing mission to provide you with exceptional heart care, we have created designated Provider Care Teams.  These Care Teams include your primary Cardiologist (physician) and Advanced Practice Providers (APPs -  Physician Assistants and Nurse Practitioners) who all work together to provide you with the care you need, when you need it.  We recommend signing up for the patient portal called "MyChart".  Sign up information is provided on this After Visit Summary.  MyChart is used to connect with patients for Virtual Visits (Telemedicine).  Patients are able to view lab/test results, encounter notes, upcoming appointments, etc.  Non-urgent messages can be sent to your provider as well.   To learn more about what you can do with MyChart, go to [email or web address].    Your next appointment:   1 year(s)  Provider:   Dr Tonny Bollman

## 2023-04-13 ENCOUNTER — Other Ambulatory Visit: Payer: Self-pay | Admitting: Nurse Practitioner

## 2023-05-25 ENCOUNTER — Other Ambulatory Visit: Payer: Self-pay | Admitting: Cardiovascular Disease

## 2023-05-25 DIAGNOSIS — I480 Paroxysmal atrial fibrillation: Secondary | ICD-10-CM

## 2023-05-26 NOTE — Telephone Encounter (Signed)
Prescription refill request for Eliquis received. Indication:afib Last office visit:12/24 Scr:0.62  11/24 Age: 78 Weight:41.6  kg  Prescription refilled

## 2023-06-12 ENCOUNTER — Inpatient Hospital Stay: Payer: Medicare HMO | Admitting: Neurology

## 2023-06-17 ENCOUNTER — Other Ambulatory Visit: Payer: Self-pay | Admitting: Cardiovascular Disease

## 2023-06-17 DIAGNOSIS — I480 Paroxysmal atrial fibrillation: Secondary | ICD-10-CM

## 2023-06-17 MED ORDER — ISOSORBIDE MONONITRATE ER 30 MG PO TB24: 30.0000 mg | ORAL_TABLET | Freq: Every day | ORAL | 3 refills | Status: AC

## 2023-06-17 MED ORDER — ROSUVASTATIN CALCIUM 40 MG PO TABS: 40.0000 mg | ORAL_TABLET | Freq: Every day | ORAL | 3 refills | Status: DC

## 2023-06-17 MED ORDER — METOPROLOL SUCCINATE ER 25 MG PO TB24: 25.0000 mg | ORAL_TABLET | Freq: Every day | ORAL | 3 refills | Status: DC

## 2023-06-17 MED ORDER — APIXABAN 5 MG PO TABS: 5.0000 mg | ORAL_TABLET | Freq: Two times a day (BID) | ORAL | 1 refills | Status: DC

## 2023-06-17 NOTE — Telephone Encounter (Signed)
*  STAT* If patient is at the pharmacy, call can be transferred to refill team.   1. Which medications need to be refilled? (please list name of each medication and dose if known)   ELIQUIS 5 MG TABS tablet  metoprolol succinate (TOPROL-XL) 25 MG 24 hr tablet  rosuvastatin (CRESTOR) 40 MG tablet  isosorbide mononitrate (IMDUR) 30 MG 24 hr tablet   2. Would you like to learn more about the convenience, safety, & potential cost savings by using the [facility]?   3. Are you open to using the [facility] (Type [facility]. ).  4. Which pharmacy/location (including street and city if local pharmacy) is medication to be sent to?  [facility] - [place], [place] - [address]   5. Do they need a 30 day or 90 day supply?   90 day  Patient stated she still has medications but is transferring her prescriptions to [facility].

## 2023-06-17 NOTE — Telephone Encounter (Signed)
 Eliquis 5mg  refill request received. Patient is 78 years old, weight-41.6kg, Crea-0.62 on 03/22/23, Diagnosis-Afib, and last seen by Dr. Elease Hashimoto on 03/31/23. Dose is appropriate based on dosing criteria. Will send in refill to requested pharmacy.

## 2023-06-27 ENCOUNTER — Telehealth: Payer: Self-pay

## 2023-06-27 NOTE — Telephone Encounter (Signed)
 [facility] mail [facility] pharmacy is stating that pt was prescribed Rosuvastatin 40 mg tablets and pt is 78 years old and Renal function declines with aging and pt my require a dosage adjustment to reduce the risk of myopathy and rhabdomyolysis. Would Dr. Elease Hashimoto like to adjust medication? Please address   Inv# [id number]   Ph#[phone]

## 2023-06-30 NOTE — Telephone Encounter (Signed)
 Spoke with patient and she states she has been having problem getting [facility] prescriptions [facility]. All prescriptions were sent on 06/17/23. Patient states she called pharmacy and they were concerned about rosuvastatin and how it will work with her body and age. Will call call pharmacy

## 2023-06-30 NOTE — Telephone Encounter (Signed)
 I tried to call pharmacy but it kept taking me to patient line. I pressed prescribing but no options would get me passed automated system..  Will try again later

## 2023-06-30 NOTE — Telephone Encounter (Signed)
 Patient is calling to follow up. Patient stated she running out of Eliquis and stated she did not received the Eliquis when we sent the prescription to [facility] on 06/17/23. Patient is requesting for a call back.

## 2023-07-03 NOTE — Telephone Encounter (Signed)
 Called [facility] was advised Eliquis was shipped out on 07/02/23 and pt should receive in 3-5 days.   Called pt to advise of this information.  Pt expresses you are just now calling back.  Advised pt I do apologize for delay however I am trying to assist.  Pt reports last took Eliquis yesterday.  Expresses I hope I don't die before I get the medication.  Offered to leave 1 week of samples at front desk for pick up. Pt expressed she doesn't want them and hopes I have a better day.

## 2023-07-21 IMAGING — DX DG CHEST 2V
2 series · 2 of 2 positions shown · non-contrast
Comparison: Chest x-ray 03/12/2011.

CLINICAL DATA: Chest pain.

EXAM:
CHEST - 2 VIEW

[w chest pa]
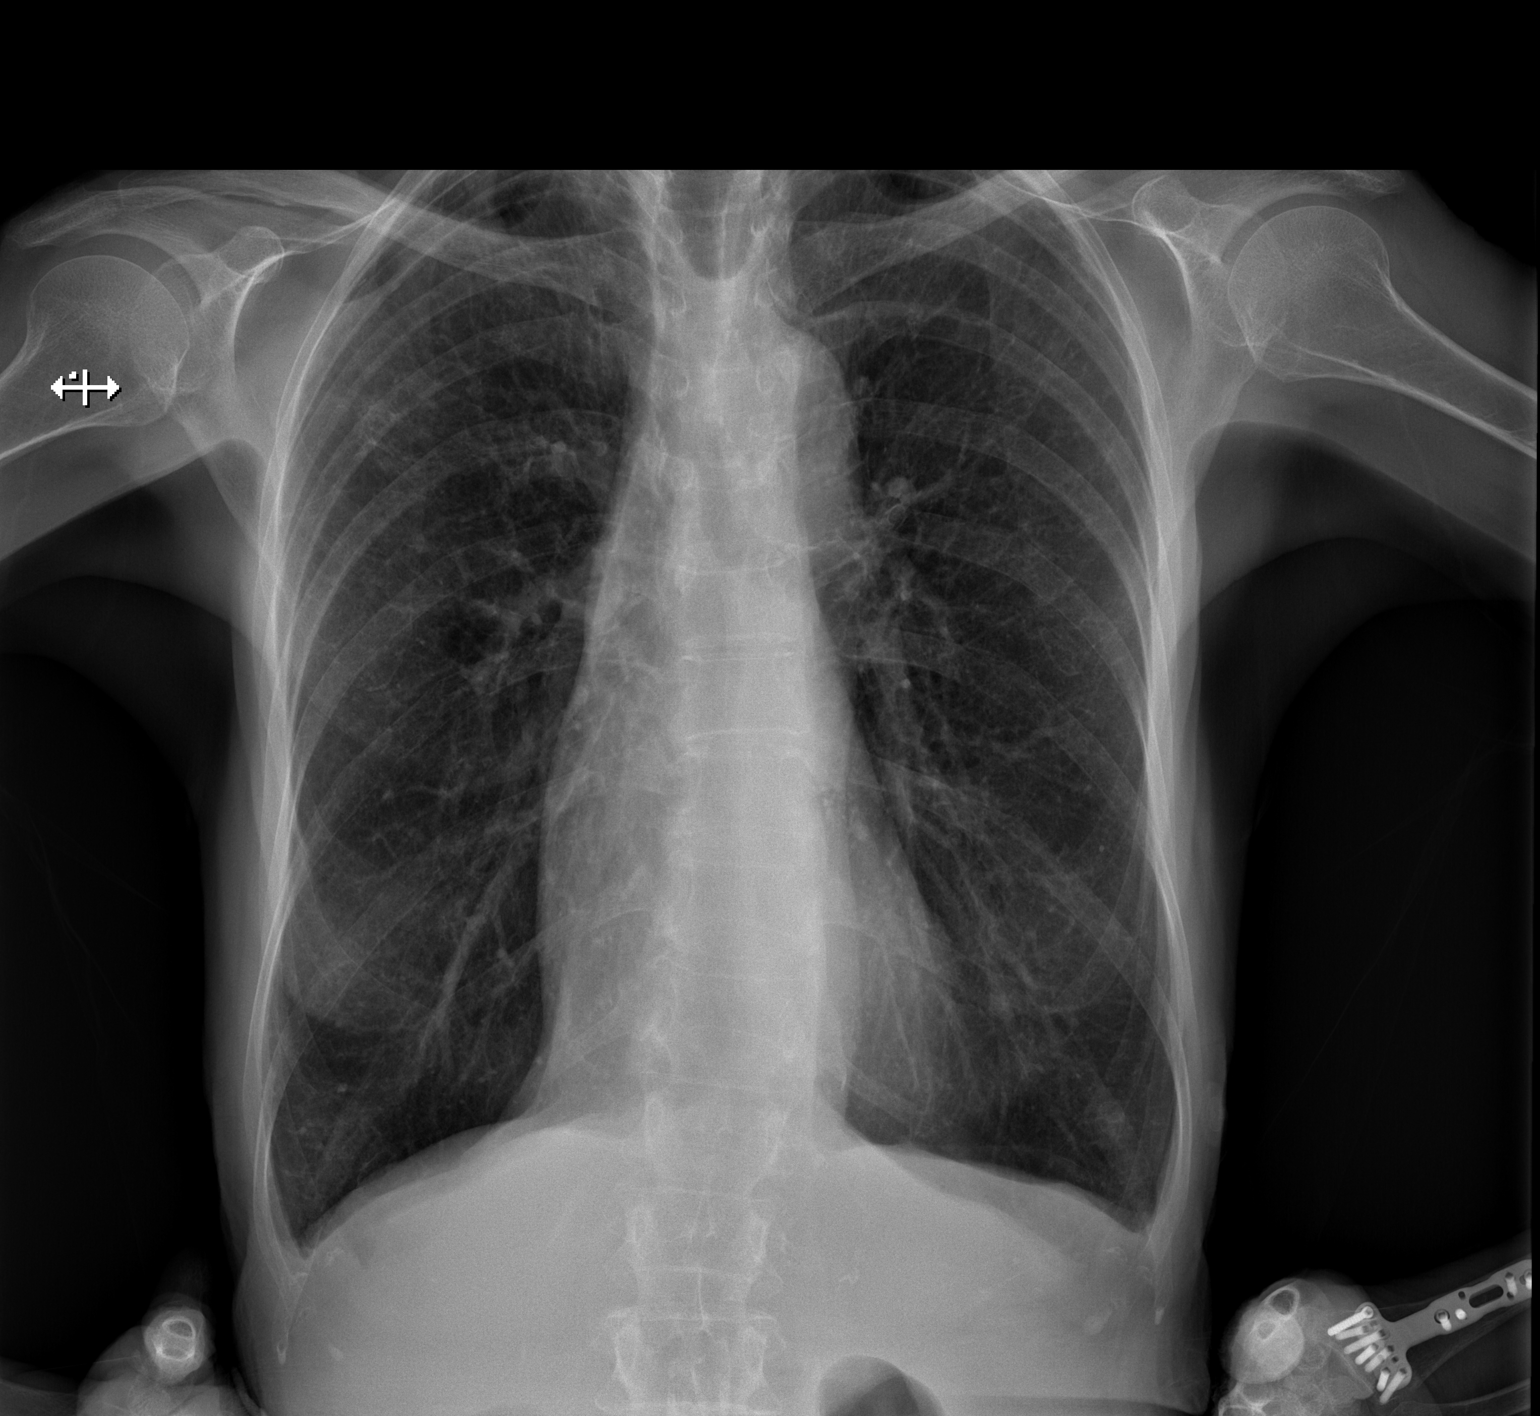

[w chest lat]
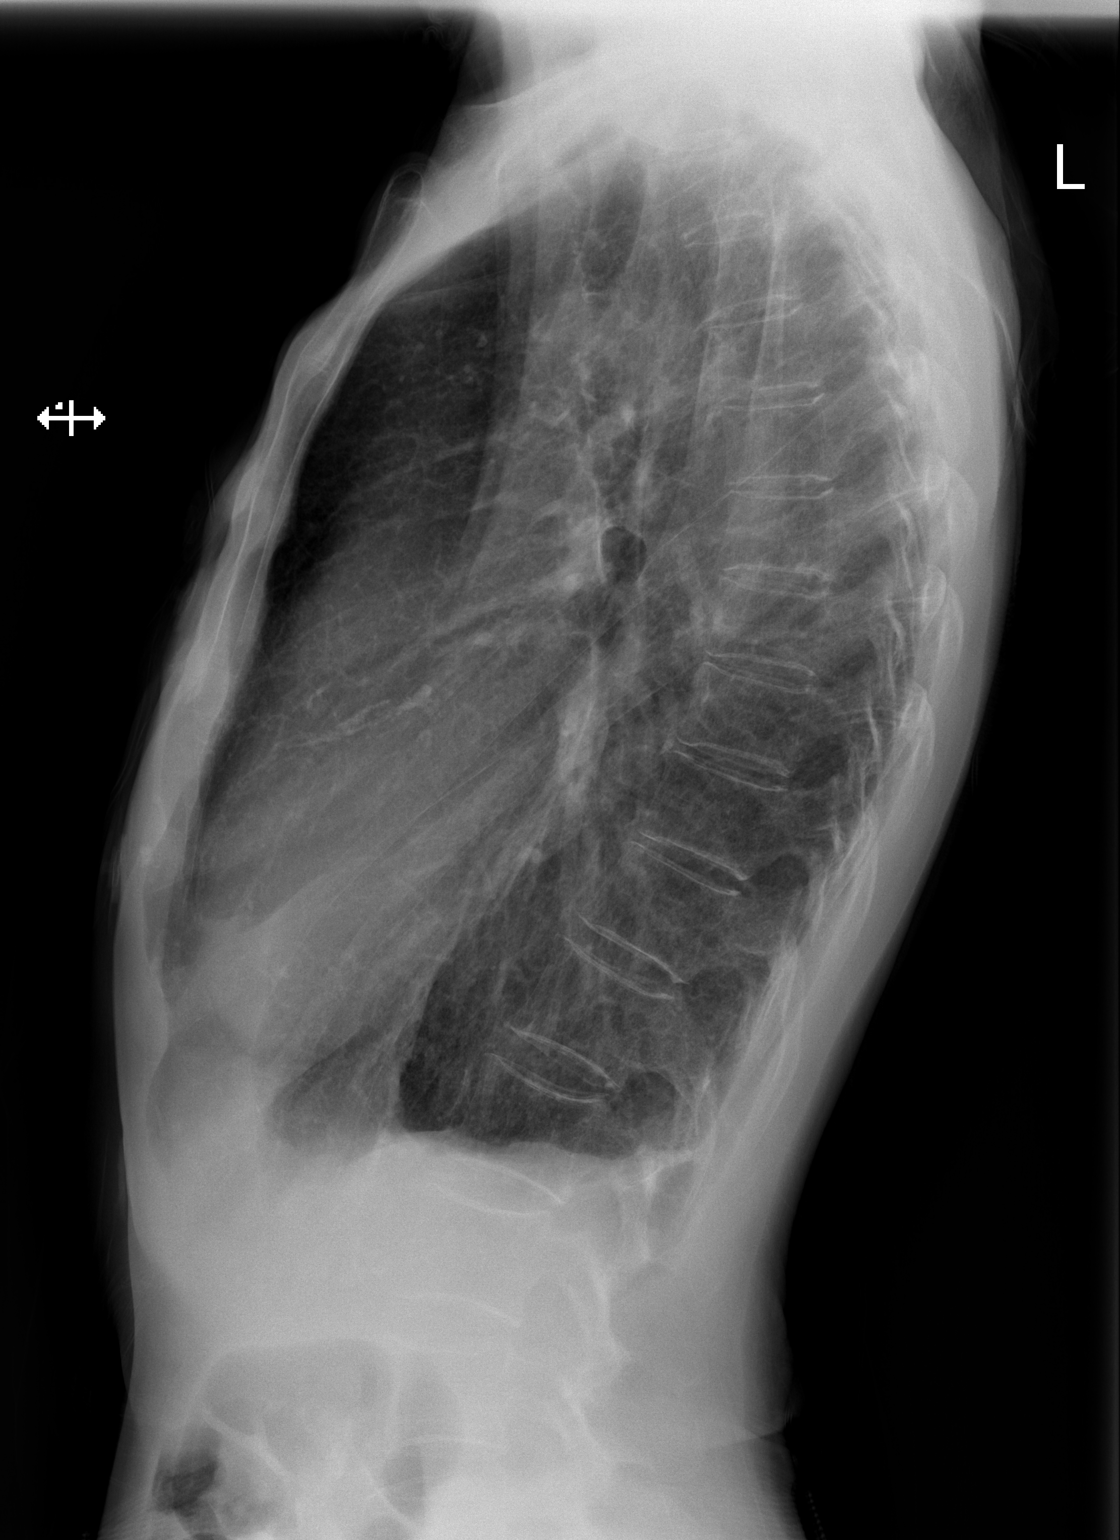

[2 of 2 positions shown; findings below may reference images not displayed]

FINDINGS: The lungs are hyperinflated, unchanged. There is no focal lung
infiltrate, pleural effusion or pneumothorax. There is some minimal
strandy opacities in the right upper lobe new from prior
examination. There is stable scarring in the lung apices.
Cardiomediastinal silhouette is within normal limits. No acute
fractures are seen.
IMPRESSION: 1. No evidence for pneumonia or edema.
2. Minimal strandy opacities in the right upper lobe may be
infectious/inflammatory. Recommend follow-up PA and Lat chest x-ray
in 4-6 weeks to confirm resolution.
3.  Emphysema ([id number]).

## 2023-08-21 ENCOUNTER — Telehealth: Payer: Self-pay | Admitting: Neurology

## 2023-08-21 ENCOUNTER — Ambulatory Visit: Payer: Medicare HMO | Admitting: Neurology

## 2023-08-21 VITALS — BP 94/54 | HR 71 | Ht 66.0 in | Wt 85.0 lb

## 2023-08-21 DIAGNOSIS — E785 Hyperlipidemia, unspecified: Secondary | ICD-10-CM | POA: Diagnosis not present

## 2023-08-21 DIAGNOSIS — I482 Chronic atrial fibrillation, unspecified: Secondary | ICD-10-CM

## 2023-08-21 DIAGNOSIS — R200 Anesthesia of skin: Secondary | ICD-10-CM | POA: Diagnosis not present

## 2023-08-21 DIAGNOSIS — I6381 Other cerebral infarction due to occlusion or stenosis of small artery: Secondary | ICD-10-CM | POA: Diagnosis not present

## 2023-08-21 NOTE — Patient Instructions (Signed)
 I had a long d/w patient about her recent thalamic stroke,chronic atrial fibrillation, risk for recurrent stroke/TIAs, personally independently reviewed imaging studies and stroke evaluation results and answered questions.Continue Eliquis  (apixaban )  5 mg twice daily  for secondary stroke prevention and maintain strict control of hypertension with blood pressure goal below 130/90, diabetes with hemoglobin A1c goal below 6.5% and lipids with LDL cholesterol goal below 70 mg/dL. I also advised the patient to eat a healthy diet with plenty of whole grains, cereals, fruits and vegetables, exercise regularly and maintain ideal body weight Followup in the future with my nurse practitioner in 6 months or call earlier if needed.  Stroke Prevention Some medical conditions and behaviors can lead to a higher chance of having a stroke. You can help prevent a stroke by eating healthy, exercising, not smoking, and managing any medical conditions you have. Stroke is a leading cause of functional impairment. Primary prevention is particularly important because a majority of strokes are first-time events. Stroke changes the lives of not only those who experience a stroke but also their family and other caregivers. How can this condition affect me? A stroke is a medical emergency and should be treated right away. A stroke can lead to brain damage and can sometimes be life-threatening. If a person gets medical treatment right away, there is a better chance of surviving and recovering from a stroke. What can increase my risk? The following medical conditions may increase your risk of a stroke: Cardiovascular disease. High blood pressure (hypertension). Diabetes. High cholesterol. Sickle cell disease. Blood clotting disorders (hypercoagulable state). Obesity. Sleep disorders (obstructive sleep apnea). Other risk factors include: Being older than age 87. Having a history of blood clots, stroke, or mini-stroke  (transient ischemic attack, TIA). Genetic factors, such as race, ethnicity, or a family history of stroke. Smoking cigarettes or using other tobacco products. Taking birth control pills, especially if you also use tobacco. Heavy use of alcohol  or drugs, especially cocaine and methamphetamine. Physical inactivity. What actions can I take to prevent this? Manage your health conditions High cholesterol levels. Eating a healthy diet is important for preventing high cholesterol. If cholesterol cannot be managed through diet alone, you may need to take medicines. Take any prescribed medicines to control your cholesterol as told by your health care provider. Hypertension. To reduce your risk of stroke, try to keep your blood pressure below 130/80. Eating a healthy diet and exercising regularly are important for controlling blood pressure. If these steps are not enough to manage your blood pressure, you may need to take medicines. Take any prescribed medicines to control hypertension as told by your health care provider. Ask your health care provider if you should monitor your blood pressure at home. Have your blood pressure checked every year, even if your blood pressure is normal. Blood pressure increases with age and some medical conditions. Diabetes. Eating a healthy diet and exercising regularly are important parts of managing your blood sugar (glucose). If your blood sugar cannot be managed through diet and exercise, you may need to take medicines. Take any prescribed medicines to control your diabetes as told by your health care provider. Get evaluated for obstructive sleep apnea. Talk to your health care provider about getting a sleep evaluation if you snore a lot or have excessive sleepiness. Make sure that any other medical conditions you have, such as atrial fibrillation or atherosclerosis, are managed. Nutrition Follow instructions from your health care provider about what to eat or drink  to help  manage your health condition. These instructions may include: Reducing your daily calorie intake. Limiting how much salt (sodium) you use to 1,500 milligrams (mg) each day. Using only healthy fats for cooking, such as olive oil, canola oil, or sunflower oil. Eating healthy foods. You can do this by: Choosing foods that are high in fiber, such as whole grains, and fresh fruits and vegetables. Eating at least 5 servings of fruits and vegetables a day. Try to fill one-half of your plate with fruits and vegetables at each meal. Choosing lean protein foods, such as lean cuts of meat, poultry without skin, fish, tofu, beans, and nuts. Eating low-fat dairy products. Avoiding foods that are high in sodium. This can help lower blood pressure. Avoiding foods that have saturated fat, trans fat, and cholesterol. This can help prevent high cholesterol. Avoiding processed and prepared foods. Counting your daily carbohydrate intake.  Lifestyle If you drink alcohol : Limit how much you have to: 0-1 drink a day for women who are not pregnant. 0-2 drinks a day for men. Know how much alcohol  is in your drink. In the [place] one drink equals one 12 oz bottle of beer ( ), one 5 oz glass of wine ( ), or one 1 oz glass of hard liquor (44mL). Do not use any products that contain nicotine or tobacco. These products include cigarettes, chewing tobacco, and vaping devices, such as e-cigarettes. If you need help quitting, ask your health care provider. Avoid secondhand smoke. Do not use drugs. Activity  Try to stay at a healthy weight. Get at least 30 minutes of exercise on most days, such as: Fast walking. Biking. Swimming. Medicines Take over-the-counter and prescription medicines only as told by your health care provider. Aspirin  or blood thinners (antiplatelets or anticoagulants) may be recommended to reduce your risk of forming blood clots that can lead to stroke. Avoid taking birth control  pills. Talk to your health care provider about the risks of taking birth control pills if: You are over 59 years old. You smoke. You get very bad headaches. You have had a blood clot. Where to find more information American Stroke Association: www.strokeassociation.org Get help right away if: You or a loved one has any symptoms of a stroke. "BE FAST" is an easy way to remember the main warning signs of a stroke: B - Balance. Signs are dizziness, sudden trouble walking, or loss of balance. E - Eyes. Signs are trouble seeing or a sudden change in vision. F - Face. Signs are sudden weakness or numbness of the face, or the face or eyelid drooping on one side. A - Arms. Signs are weakness or numbness in an arm. This happens suddenly and usually on one side of the body. S - Speech. Signs are sudden trouble speaking, slurred speech, or trouble understanding what people say. T - Time. Time to call emergency services. Write down what time symptoms started. You or a loved one has other signs of a stroke, such as: A sudden, severe headache with no known cause. Nausea or vomiting. Seizure. These symptoms may represent a serious problem that is an emergency. Do not wait to see if the symptoms will go away. Get medical help right away. Call your local emergency services (911 in the [place] Do not drive yourself to the hospital. Summary You can help to prevent a stroke by eating healthy, exercising, not smoking, limiting alcohol  intake, and managing any medical conditions you may have. Do not use any products that contain nicotine or tobacco. These  include cigarettes, chewing tobacco, and vaping devices, such as e-cigarettes. If you need help quitting, ask your health care provider. Remember "BE FAST" for warning signs of a stroke. Get help right away if you or a loved one has any of these signs. This information is not intended to replace advice given to you by your health care provider. Make sure you  discuss any questions you have with your health care provider. Document Revised: 03/11/2022 Document Reviewed: 03/11/2022 Elsevier Patient Education  2024 [employer].

## 2023-08-21 NOTE — Telephone Encounter (Signed)
 Pt called for directions to the office

## 2023-08-21 NOTE — Progress Notes (Signed)
 [facility] [address] [place]. [place] [address] [phone]       OFFICE FOLLOW-UP NOTE  [name] Date of Birth:  19-Nov-1945 Medical Record Number:  [id number]   HPI: [name] is a 78 year old Caucasian lady seen today for initial office follow-up visit following hospital consultation for stroke November 2024.  She has past medical history of gastroesophageal reflux disease, colitis, arthritis, hypertension, hyperlipidemia, irritable bowel syndrome, osteoporosis and atrial fibrillation and COPD.  Patient presented on 03/21/2023 with sudden onset of numbness involving left arm and face and decreased sensation in the left leg.  NIH stroke scale on admission was only 1.  Scan confirmed a tiny punctate right thalamic infarct.  Patient had history of chronic A-fib.  She is started on Eliquis .  LDL cholesterol 130 mg percent.  Hemoglobin A1c was 5.6.  Echocardiogram on 11/16/2022 had shown EF 55 to 60% with grade 1 diastolic dysfunction.  CT angiogram had shown no large vessel occlusion.  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  Patient was started on Crestor  which she had previously had trouble tolerating.  She states her numbness is improving though not completely gone.  She still has some intermittent numbness around the left eye and the left foot.  She was not able to tolerate Crestor  again due to severe myalgias and stopped it 4 months ago.  She is tolerating Eliquis  well with only minor bruising and no bleeding episodes.  She has had no recurrent stroke or TIA symptoms.  She is quite active and eats healthy and in fact has trouble gaining weight and looks malnourished.  ROS:   14 system review of systems is positive for numbness, tingling, bruising, myalgias all other systems negative  PMH:  Past Medical History:  Diagnosis Date   Arthritis    Colitis 2003   per path report from 2003: non-specific.  normal colonoscopy in  04/2011.    GERD (gastroesophageal reflux disease)    Hypertension    Irritable bowel syndrome    Osteoporosis    Palpitations    Pneumonia    hx   Rhinitis     Social History:  Social History   Socioeconomic History   Marital status: Married    Spouse name: Not on file   Number of children: 2   Years of education: Not on file   Highest education level: Not on file  Occupational History   Occupation: Retired  Tobacco Use   Smoking status: Former    Current packs/day: 0.00    Average packs/day: 0.3 packs/day for 50.0 years (12.5 ttl pk-yrs)    Types: Cigarettes    Start date: 87    Quit date: 2020    Years since quitting: 5.3   Smokeless tobacco: Never   Tobacco comments:    Patient smokes 5 cigarettes a day  Vaping Use   Vaping status: Never Used  Substance and Sexual Activity   Alcohol  use: No    Alcohol /week: 0.0 standard drinks of alcohol    Drug use: No   Sexual activity: Not on file  Other Topics Concern   Not on file  Social History Narrative   Married to College-   2 children   Retired   Former smoker no alcohol  tobacco or drug use currently   Social Drivers of Corporate investment banker Strain: Not on file  Food Insecurity: No Food Insecurity (03/21/2023)   Hunger Vital Sign  Worried About Programme researcher, broadcasting/film/video in the Last Year: Never true    Ran Out of Food in the Last Year: Never true  Transportation Needs: No Transportation Needs (03/21/2023)   PRAPARE - Administrator, Civil Service (Medical): No    Lack of Transportation (Non-Medical): No  Physical Activity: Not on file  Stress: Not on file  Social Connections: Unknown (09/03/2021)   [facility]   Social Network    Social Network: Not on file  Intimate Partner Violence: Not At Risk (03/21/2023)   Humiliation, Afraid, Rape, and Kick questionnaire    Fear of Current or Ex-Partner: No    Emotionally Abused: No    Physically Abused: No    Sexually  Abused: No    Medications:   Current Outpatient Medications on File Prior to Visit  Medication Sig Dispense Refill   acetaminophen  (TYLENOL ) 500 MG tablet Take 1,000 mg by mouth once as needed for moderate pain (pain score 4-6).     apixaban  (ELIQUIS ) 5 MG TABS tablet Take 1 tablet (5 mg total) by mouth 2 (two) times daily. 180 tablet 1   Ascorbic Acid  (VITAMIN C ) 1000 MG tablet Take 500 mg by mouth daily.     Coenzyme Q10 (CO Q 10) 100 MG CAPS Take 100 mg by mouth daily in the afternoon.     ELDERBERRY PO Take 1,000 mg by mouth daily in the afternoon.     Fish Oil-Cholecalciferol (OMEGA-3 FISH OIL-VITAMIN D3) 1200-1000 MG-UNIT CAPS Take 3 capsules by mouth daily.     isosorbide  mononitrate (IMDUR ) 30 MG 24 hr tablet Take 1 tablet (30 mg total) by mouth daily. 90 tablet 3   Magnesium  400 MG CAPS Take 400 mg by mouth daily.     metoprolol  succinate (TOPROL -XL) 25 MG 24 hr tablet Take 1 tablet (25 mg total) by mouth daily. 90 tablet 3   Probiotic Product (PROBIOTIC DAILY) CAPS Take 1 capsule by mouth daily.     Propylene Glycol (SYSTANE COMPLETE) 0.6 % SOLN Place 1 drop into both eyes daily as needed (Dry eye).     rosuvastatin  (CRESTOR ) 40 MG tablet Take 1 tablet (40 mg total) by mouth daily. 90 tablet 3   sodium chloride  (OCEAN) 0.65 % SOLN nasal spray Place 1 spray into both nostrils daily as needed for congestion.     No current facility-administered medications on file prior to visit.    Allergies:   Allergies  Allergen Reactions   Hydrocodone Nausea And Vomiting   Amlodipine     Severe all over joint pain   Azithromycin Nausea Only   Excedrin Extra Strength [Aspirin -Acetaminophen -Caffeine] Nausea Only    Extreme nausea   Niacin Nausea And Vomiting   Niacin And Related Nausea And Vomiting   Sulfa Antibiotics Nausea And Vomiting and Other (See Comments)    Violently ill   Sulfasalazine Nausea And Vomiting    Violently ill   Latex Rash    Physical Exam    08/21/2023   11:01  AM 03/31/2023    9:51 AM 03/22/2023    9:50 AM  Vitals with BMI  Height 5\' 6"  5\' 6"    Weight 85 lbs 91 lbs 13 oz   BMI 13.73 14.82   Systolic 94 120 129  Diastolic 54 62 86  Pulse 71 62 66    General: Frail malnourished looking elderly Caucasian lady seated, in no evident distress Head: head normocephalic and atraumatic.  Neck: supple with no  carotid or supraclavicular bruits Cardiovascular: regular rate and rhythm, no murmurs Musculoskeletal: no deformity Skin:  no rash/petichiae Vascular:  Normal pulses all extremities There were no vitals filed for this visit. Neurologic Exam Mental Status: Awake and fully alert. Oriented to place and time. Recent and remote memory intact. Attention span, concentration and fund of knowledge appropriate. Mood and affect appropriate.  Cranial Nerves: Fundoscopic exam reveals sharp disc margins. Pupils equal, briskly reactive to light. Extraocular movements full without nystagmus. Visual fields full to confrontation. Hearing intact. Facial sensation intact. Face, tongue, palate moves normally and symmetrically.  Motor: Normal bulk and tone. Normal strength in all tested extremity muscles. Sensory.: intact to touch ,pinprick .position and vibratory sensation.  Coordination: Rapid alternating movements normal in all extremities. Finger-to-nose and heel-to-shin performed accurately bilaterally. Gait and Station: Arises from chair without difficulty. Stance is normal. Gait demonstrates normal stride length and balance . Able to heel, toe and tandem walk without difficulty.  Reflexes: 1+ and symmetric. Toes downgoing.   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  She is doing well with only minor residual paresthesias. Vascular risk factors of hyperlipidemia and A-fib    PLAN:I had a long d/w patient about her recent thalamic stroke,chronic  atrial fibrillation, risk for recurrent stroke/TIAs, personally independently reviewed imaging studies and stroke evaluation results and answered questions.Continue Eliquis  (apixaban )  5 mg twice daily  for secondary stroke prevention and maintain strict control of hypertension with blood pressure goal below 130/90, diabetes with hemoglobin A1c goal below 6.5% and lipids with LDL cholesterol goal below 70 mg/dL. I also advised the patient to eat a healthy diet with plenty of whole grains, cereals, fruits and vegetables, exercise regularly and maintain ideal body weight I advised the patient to discuss with her primary care physician/cardiologist alternatives to statins for elevated lipids.  Followup in the future with my nurse practitioner in 6 months or call earlier if needed.  Greater than 50% of time during this 35 minute visit was spent on counseling,explanation of diagnosis thalamic infarct and atrial fibrillation, planning of further management, discussion with patient and family and coordination of care Ardella Beaver, MD Note: This document was prepared with digital dictation and possible smart phrase technology. Any transcriptional errors that result from this process are unintentional

## 2023-10-09 ENCOUNTER — Telehealth: Payer: Self-pay

## 2023-10-09 ENCOUNTER — Other Ambulatory Visit (HOSPITAL_COMMUNITY): Payer: Self-pay

## 2023-10-09 ENCOUNTER — Telehealth: Payer: Self-pay | Admitting: Cardiovascular Disease

## 2023-10-09 MED ORDER — APIXABAN 5 MG PO TABS: 5.0000 mg | ORAL_TABLET | Freq: Two times a day (BID) | ORAL | 0 refills | Status: DC

## 2023-10-09 MED ORDER — APIXABAN 5 MG PO TABS
5.0000 mg | ORAL_TABLET | Freq: Two times a day (BID) | ORAL | 0 refills | Status: DC
  Filled 2023-10-09: qty 28, 14d supply, fill #0

## 2023-10-09 NOTE — Telephone Encounter (Signed)
 Patient calling the office for samples of medication:   1.  What medication and dosage are you requesting samples for? apixaban (ELIQUIS) 5 MG TABS tablet  2.  Are you currently out of this medication? Yes

## 2023-10-09 NOTE — Telephone Encounter (Signed)
 Eliquis  5 mg by mouth twice daily samples for the patient is ready to pick up from the front dest and patient is already notified to pick her samples.

## 2023-10-09 NOTE — Telephone Encounter (Signed)
 Medication name/dosage: Samples List: Eliquis  5 mg  Administration instructions:   Reason for samples: Reason for samples: unable to afford medication  Ordering provider: Nahser  *Once above information entered, route the phone encounter to CV DIV MAG [facility] SAMPLES and send Teams message to team member assigned to Samples for the day.

## 2023-10-09 NOTE — Addendum Note (Signed)
 Addended by: Elyn Han on: 10/09/2023 11:52 AM   Modules accepted: Orders

## 2023-11-07 ENCOUNTER — Encounter: Payer: Self-pay | Admitting: Advanced Practice Midwife

## 2024-01-05 ENCOUNTER — Other Ambulatory Visit: Payer: Self-pay | Admitting: Nurse Practitioner

## 2024-01-05 DIAGNOSIS — I480 Paroxysmal atrial fibrillation: Secondary | ICD-10-CM

## 2024-01-06 MED ORDER — APIXABAN 5 MG PO TABS: 5.0000 mg | ORAL_TABLET | Freq: Two times a day (BID) | ORAL | 1 refills | Status: AC

## 2024-01-06 NOTE — Telephone Encounter (Signed)
 Prescription refill request for Eliquis  received. Indication: a fib Last office visit: 03/31/23 Scr: 0.66 [facility] 04/01/23 Age: 78 Weight: 38kg

## 2024-02-19 ENCOUNTER — Telehealth: Payer: Self-pay | Admitting: Neurology

## 2024-02-19 NOTE — Telephone Encounter (Signed)
 Patient called to reschedule appointment due to scheduling conflict

## 2024-02-23 ENCOUNTER — Encounter: Payer: Self-pay | Admitting: Radiology

## 2024-02-26 ENCOUNTER — Ambulatory Visit: Admitting: Neurology

## 2024-02-27 ENCOUNTER — Other Ambulatory Visit: Payer: Self-pay | Admitting: Physician Assistant

## 2024-02-27 ENCOUNTER — Emergency Department (HOSPITAL_BASED_OUTPATIENT_CLINIC_OR_DEPARTMENT_OTHER)
Admission: EM | Admit: 2024-02-27 | Discharge: 2024-02-27 | Disposition: A | Discharge status: Home / Self Care | Attending: Emergency Medicine | Admitting: Emergency Medicine

## 2024-02-27 ENCOUNTER — Encounter (HOSPITAL_BASED_OUTPATIENT_CLINIC_OR_DEPARTMENT_OTHER): Payer: Self-pay

## 2024-02-27 ENCOUNTER — Emergency Department (HOSPITAL_BASED_OUTPATIENT_CLINIC_OR_DEPARTMENT_OTHER)

## 2024-02-27 ENCOUNTER — Other Ambulatory Visit: Payer: Self-pay

## 2024-02-27 DIAGNOSIS — G43109 Migraine with aura, not intractable, without status migrainosus: Secondary | ICD-10-CM | POA: Insufficient documentation

## 2024-02-27 DIAGNOSIS — Z91040 Latex allergy status: Secondary | ICD-10-CM | POA: Diagnosis not present

## 2024-02-27 DIAGNOSIS — Z79899 Other long term (current) drug therapy: Secondary | ICD-10-CM | POA: Diagnosis not present

## 2024-02-27 DIAGNOSIS — I10 Essential (primary) hypertension: Secondary | ICD-10-CM | POA: Insufficient documentation

## 2024-02-27 DIAGNOSIS — Z7901 Long term (current) use of anticoagulants: Secondary | ICD-10-CM | POA: Diagnosis not present

## 2024-02-27 DIAGNOSIS — R519 Headache, unspecified: Secondary | ICD-10-CM | POA: Diagnosis present

## 2024-02-27 DIAGNOSIS — I4891 Unspecified atrial fibrillation: Secondary | ICD-10-CM | POA: Insufficient documentation

## 2024-02-27 LAB — CBC
HCT: 41.1 % (ref 36.0–46.0)
Hemoglobin: 13.7 g/dL (ref 12.0–15.0)
MCH: 30 pg (ref 26.0–34.0)
MCHC: 33.3 g/dL (ref 30.0–36.0)
MCV: 90.1 fL (ref 80.0–100.0)
Platelets: 323 K/uL (ref 150–400)
RBC: 4.56 MIL/uL (ref 3.87–5.11)
RDW: 13.7 % (ref 11.5–15.5)
WBC: 14.7 K/uL — ABNORMAL HIGH (ref 4.0–10.5)
nRBC: 0 % (ref 0.0–0.2)

## 2024-02-27 LAB — C-REACTIVE PROTEIN: CRP: <0.5 mg/dL (ref <1.0)

## 2024-02-27 LAB — BASIC METABOLIC PANEL WITH GFR
Anion gap: 11 (ref 5–15)
BUN: 22 mg/dL (ref 8–23)
CO2: 26 mmol/L (ref 22–32)
Calcium: 10.6 mg/dL — ABNORMAL HIGH (ref 8.9–10.3)
Chloride: 96 mmol/L — ABNORMAL LOW (ref 98–111)
Creatinine, Ser: 0.63 mg/dL (ref 0.44–1.00)
GFR, Estimated: >60 mL/min (ref >60)
Glucose, Bld: 116 mg/dL — ABNORMAL HIGH (ref 70–99)
Potassium: 4.9 mmol/L (ref 3.5–5.1)
Sodium: 133 mmol/L — ABNORMAL LOW (ref 135–145)

## 2024-02-27 LAB — SEDIMENTATION RATE: Sed Rate: 20 mm/h (ref 0–22)

## 2024-02-27 MED ORDER — IOHEXOL 300 MG/ML  SOLN: 75.0000 mL | Freq: Once | INTRAMUSCULAR | Status: DC | PRN

## 2024-02-27 MED ORDER — IOHEXOL 350 MG/ML SOLN
75.0000 mL | Freq: Once | INTRAVENOUS | Status: AC | PRN
  Administered 2024-02-27: 75 mL via INTRAVENOUS

## 2024-02-27 NOTE — ED Triage Notes (Addendum)
 Patient reports headache since October. She says she has seen multiple providers but is unable to get an MRI until March. 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 Reports her prednisone  and ibuprofen  is no longer helping. She says she is here hoping to get MRI.

## 2024-02-27 NOTE — ED Notes (Signed)
 Reviewed AVS/discharge instruction with patient. Time allotted for and all questions answered. Patient is agreeable for d/c and escorted to ed exit by staff.

## 2024-02-27 NOTE — Discharge Instructions (Addendum)
 You were seen for your headache in the emergency department.   At home, please talk to your primary doctor about discontinuing the prednisone  because this does not appear to be consistent with temporal artertitis.    Check your MyChart online for the results of any tests that had not resulted by the time you left the emergency department.   Follow-up with your primary doctor in 2-3 days regarding your visit.    Return immediately to the emergency department if you experience any of the following: worsening vision changes, or any other concerning symptoms.    Thank you for visiting our Emergency Department. It was a pleasure taking care of you today.

## 2024-02-27 NOTE — ED Provider Notes (Signed)
 [place] EMERGENCY DEPARTMENT AT [facility] Provider Note   CSN: [id number] Arrival date & time: 02/27/24  1336     Patient presents with: Headache   Kristina Dennis is a 78 y.o. female.   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  Over the past month has had a headache.  Intermittent sometimes lasting for very brief period of time other times lasting for hours.  Typically starts in her right temple and behind her right eye and occasionally will migrate behind both eyes.  Pulsating sensation.  10/10 at its worst but builds up gradually.  Says that she has floaters in her eye that appear like very bright colors on television screen.  Says her visual acuity is decreased even without the headache.  Her outpatient doctor was concerned about temporal arteritis and she was started on prednisone  on 11/2 which she is still taking now.  Has been taking ibuprofen  for the headache as well.  Was seen on 11/6 and had a sed rate that was normal and was going to try to get an MRI but her insurance would not cover it so she decided to come in today.  Says the headache is gone at this point in time.  No jaw claudication.  No fevers.  No proximal muscle soreness or weakness       Prior to Admission medications   Medication Sig Start Date End Date Taking? Authorizing Provider  acetaminophen  (TYLENOL ) 500 MG tablet Take 1,000 mg by mouth once as needed for moderate pain (pain score 4-6).    [provider]  apixaban  (ELIQUIS ) 5 MG TABS tablet Take 1 tablet (5 mg total) by mouth 2 (two) times daily. 01/06/24   Swinyer, Rosaline CHRISTELLA, NP  Ascorbic Acid  (VITAMIN C ) 1000 MG tablet Take 500 mg by mouth daily.    [provider]  Coenzyme Q10 (CO Q 10) 100 MG CAPS Take 100 mg by mouth daily in  the afternoon.    [provider]  ELDERBERRY PO Take 1,000 mg by mouth daily in the afternoon.    [provider]  Fish Oil-Cholecalciferol (OMEGA-3 FISH OIL-VITAMIN D3) 1200-1000 MG-UNIT CAPS Take 3 capsules by mouth daily.    [provider]  isosorbide  mononitrate (IMDUR ) 30 MG 24 hr tablet Take 1 tablet (30 mg total) by mouth daily. 06/17/23 06/16/24  Nahser, Aleene PARAS, MD  Magnesium  400 MG CAPS Take 400 mg by mouth daily.    [provider]  metoprolol  succinate (TOPROL -XL) 25 MG 24 hr tablet Take 1 tablet (25 mg total) by mouth daily. 06/17/23   Nahser, Aleene PARAS, MD  Probiotic Product (PROBIOTIC DAILY) CAPS Take 1 capsule by mouth daily.    [provider]  Propylene Glycol (SYSTANE COMPLETE) 0.6 % SOLN Place 1 drop into both eyes daily as needed (Dry eye).    [provider]  sodium chloride  (OCEAN) 0.65 % SOLN nasal spray Place 1 spray into both nostrils daily as needed for congestion.    [provider]    Allergies: Hydrocodone, Amlodipine, Azithromycin, Excedrin extra strength [aspirin -acetaminophen -caffeine], Niacin, Niacin and related, Sulfa antibiotics, Sulfasalazine, and Latex    Review of Systems  Updated Vital Signs BP (!) 150/91   Pulse 76   Temp 98.1 F (36.7 C) (Oral)   Resp 18   LMP 05/03/1987   SpO2 96%  Physical Exam Vitals and nursing note reviewed.  Constitutional:      General: She is not in acute distress.    Appearance: She is well-developed.  HENT:     Head: Normocephalic and atraumatic.     Right Ear: External ear normal.     Left Ear: External ear normal.     Nose: Nose normal.  Eyes:     Extraocular Movements: Extraocular movements intact.     Conjunctiva/sclera: Conjunctivae normal.     Pupils: Pupils are equal, round, and reactive to light.     Comments: 5 mm bilaterally  Bilateral Near: 20/20 Bilateral Distance: 20/30 R Near: 20/25 R Distance: 20/40 L Near: 20/25 L Distance:  20/35  No temporal artery tenderness to palpation.  Temporal artery pulses 2+ bilaterally  Visual fields full to confrontation bilaterally  Musculoskeletal:     Cervical back: Normal range of motion and neck supple.     Right lower leg: No edema.     Left lower leg: No edema.  Skin:    General: Skin is warm and dry.  Neurological:     Mental Status: She is alert and oriented to person, place, and time. Mental status is at baseline.     Comments: MENTAL STATUS: AAOx3 CRANIAL NERVES: II: Pupils equal and reactive 5 mm BL, no RAPD, no VF deficits III, IV, VI: EOM intact, no gaze preference or deviation, no nystagmus. V: Diminished sensation to light touch in the left side of the face in V1, V2, and V3 distribution which is at baseline for her after her stroke VII: no facial weakness or asymmetry, no nasolabial fold flattening VIII: normal hearing to speech and finger friction IX, X: normal palatal elevation, no uvular deviation XI: 5/5 head turn and 5/5 shoulder shrug bilaterally XII: midline tongue protrusion MOTOR: 5/5 strength in R shoulder flexion, elbow flexion and extension, and grip strength. 5/5 strength in L shoulder flexion, elbow flexion and extension, and grip strength.  5/5 strength in R hip and knee flexion, knee extension, ankle plantar and dorsiflexion. 5/5 strength in L hip and knee flexion, knee extension, ankle plantar and dorsiflexion. SENSORY: Diminished sensation to light touch in the left arm and leg which is baseline for her after her stroke COORD: Normal finger to nose and heel to shin, no tremor, no dysmetria  Psychiatric:        Mood and Affect: Mood normal.     (all labs ordered are listed, but only abnormal results are displayed) Labs Reviewed  BASIC METABOLIC PANEL WITH GFR - Abnormal; Notable for the following components:      Result Value   Sodium 133 (*)    Chloride 96 (*)    Glucose, Bld 116 (*)    Calcium  10.6 (*)    All other components within  normal limits  CBC - Abnormal; Notable for the following components:   WBC 14.7 (*)    All other components within normal limits  C-REACTIVE PROTEIN  SEDIMENTATION RATE    EKG: None  Radiology: MR BRAIN WO CONTRAST Result Date: 02/27/2024 EXAM: MRI BRAIN WITHOUT CONTRAST 02/27/2024 06:48:32 PM TECHNIQUE: Multiplanar multisequence MRI of the head/brain was performed without the administration of intravenous contrast. COMPARISON: MRI head 03/21/2023 CLINICAL HISTORY: Headache, increasing frequency or severity. FINDINGS: BRAIN AND VENTRICLES: No acute infarct. No intracranial hemorrhage. No mass. No midline shift. No hydrocephalus. Moderate T2 hyperintensities in the white matter, compatible with small vessel ischemic change. Normal flow voids. ORBITS: No acute abnormality. SINUSES  AND MASTOIDS: No acute abnormality. BONES AND SOFT TISSUES: Normal marrow signal. No acute soft tissue abnormality. IMPRESSION: 1. No acute intracranial abnormality. Electronically signed by: Gilmore Molt MD 02/27/2024 07:58 PM EST RP Workstation: [id number]   CT Angio Head Neck W WO CM Result Date: 02/27/2024 EXAM: CT HEAD WITHOUT CTA HEAD AND NECK WITH AND WITHOUT 02/27/2024 04:00:51 PM TECHNIQUE: CTA of the head and neck was performed with and without the administration of intravenous contrast. Noncontrast CT of the head with reconstructed 2-D images are also provided for review. Multiplanar 2D and/or 3D reformatted images are provided for review. Automated exposure control, iterative reconstruction, and/or weight based adjustment of the mA/kV was utilized to reduce the radiation dose to as low as reasonably achievable. COMPARISON: CTA head and neck 03/21/2023 CLINICAL HISTORY: Right sided headahce and neck pain with intermittent vision changes FINDINGS: CT HEAD: BRAIN AND VENTRICLES: There is no evidence of an acute infarct, intracranial hemorrhage, mass, midline shift, hydrocephalus, or extra-axial fluid collection.  There is mild cerebral atrophy. Patchy hypodensities in the cerebral white matter bilaterally are similar to the prior study and nonspecific but compatible with moderate chronic small vessel ischemic disease. ORBITS: Bilateral cataract extraction. SINUSES AND MASTOIDS: Mild scattered mucosal thickening in the paranasal sinuses. Small right maxillary sinus fluid level. Small left mastoid effusion. CTA NECK: AORTIC ARCH AND ARCH VESSELS: No dissection or arterial injury. No significant stenosis of the brachiocephalic or subclavian arteries. CERVICAL CAROTID ARTERIES: Moderate amount of calcified plaque at both carotid bifurcations without evidence of a significant stenosis of the common carotid or cervical internal carotid arteries. Severe stenosis of the origin of the right external carotid artery. CERVICAL VERTEBRAL ARTERIES: Strongly dominant and widely patent right vertebral artery in the neck. The left vertebral artery arises from the proximalmost left subclavian artery and is diffusely small in caliber with a severe stenosis of its origin, unchanged. LUNGS AND MEDIASTINUM: Biapical lung scarring and emphysema. 15 x 9 mm right upper lobe lung nodule, mildly enlarged from 2024. SOFT TISSUES: 1 cm avidly enhancing mass in the superficial right parotid gland, similar in size to a 2019 maxillofacial CT. BONES: No acute abnormality. CTA HEAD: ANTERIOR CIRCULATION: The internal carotid arteries are patent from the skull base to the carotid termini with moderately extensive atherosclerotic calcification bilaterally not resulting in significant stenosis. The anterior cerebral arteries (ACAs) and middle cerebral arteries (MCAs) are patent without evidence of a proximal branch occlusion or significant proximal stenosis. No aneurysm. POSTERIOR CIRCULATION: The intracranial vertebral arteries are patent to the basilar with multiple moderate to severe stenoses again noted of the nondominant left V4 segment. Patent posterior  inferior cerebellar artery (PICA) and superior cerebellar artery (SCA) origins are visualized bilaterally. The basilar artery is widely patent. There are small left and diminutive or absent right posterior communicating arteries. Both posterior cerebral arteries (PCAs) are patent without evidence of a significant proximal stenosis on the right. There are moderate proximal and distal left P2 stenoses, which are less prominent than on the prior study. No aneurysm. OTHER: No dural venous sinus thrombosis on this non-dedicated study. IMPRESSION: 1. No acute intracranial abnormality or large vessel occlusion. 2. 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 3. Moderate left P2 stenoses. 4. Cervical carotid atherosclerosis without a significant stenosis of the common carotid artery or cervical internal carotid arteries. 5. Severe stenosis of the right external carotid artery origin. 6. Mild interval enlargement of a spiculated right upper lobe lung nodule  which remains suspicious for malignancy. Electronically signed by: Dasie Hamburg MD 02/27/2024 04:42 PM EST RP Workstation: [id number]     Procedures   Medications Ordered in the ED  iohexol  (OMNIPAQUE ) 350 MG/ML injection 75 mL (75 mLs Intravenous Contrast Given 02/27/24 1548)    Clinical Course as of 02/29/24 1034  Fri Feb 27, 2024  1650 CT Angio Head Neck W WO CM Right upper lobe lung nodule and stenosis of multiple vessels without occlusion. [RP]    Clinical Course User Index [RP] Yolande Lamar BROCKS, MD                                 Medical Decision Making Amount and/or Complexity of Data Reviewed Radiology:  Decision-making details documented in ED Course.   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   Initial Ddx:  Temporal arteritis, stroke, ICH, brain tumor, migraine with aura  MDM/Course:  Patient presents to the emergency department with headache for the past month.  Also reports some decreased visual acuity.  She did not have any fever, jaw claudication, muscle weakness or soreness that would be concerning for PMR.  She had a normal sed rate.  She has been started on prednisone  though it is unclear to me exactly why.  She says she does not have a temporal artery biopsy scheduled at this point in time but is post have an MRI which has not been done yet is now patient.  She has a nonfocal neuroexam aside from the numbness from her prior stroke.  No temporal artery tenderness to palpation she has good pulses in her temporal arteries.  Her visual fields are full to confrontation.  Visual acuity is minimally decreased on each eye individually and is actually 20/20 when she has both eyes open.  She had an MRI here today as well as a CT angio head and neck that did not reveal acute abnormality.  Did show a spiculated lung nodule that is already being followed as an outpatient.  It sounds to me like she is having a migraine with aura.  I am somewhat hesitant to stop her prednisone  since I cannot see the official records from her primary care doctor's office aside from her normal ESR.  I have counseled her to call his office and have another discussion with him about the concerns for temporal arteritis to make sure that there is nothing else that was going on to make them think that this could be the case.  But at this point in time given her workup today which is also included normal ESR and CRP I have a very low concern for temporal arteritis and feel that there could potentially be more harm than benefit from a biopsy  This patient presents to the ED for concern of complaints listed in HPI, this involves an extensive number of treatment options, and is a complaint that carries  with it a high risk of complications and morbidity. Disposition including potential need for admission considered.   Dispo: DC Home. Return precautions discussed including, but not limited to, those listed in the AVS. Allowed pt time to ask questions which were answered fully prior to dc.  Additional history obtained from spouse Records reviewed Outpatient Clinic Notes The following labs were  independently interpreted: Chemistry and show no acute abnormality I personally reviewed and interpreted cardiac monitoring: normal sinus rhythm  I personally reviewed and interpreted the pt's EKG: see above for interpretation  I have reviewed the patients home medications and made adjustments as needed Social Determinants of health:  Geriatric  Portions of this note were generated with Scientist, clinical (histocompatibility and immunogenetics). Dictation errors may occur despite best attempts at proofreading.     Final diagnoses:  Migraine with aura and without status migrainosus, not intractable    ED Discharge Orders     None          Yolande Lamar BROCKS, MD 02/29/24 1034

## 2024-03-03 ENCOUNTER — Ambulatory Visit: Admitting: Neurology

## 2024-03-03 ENCOUNTER — Encounter: Payer: Self-pay | Admitting: Neurology

## 2024-03-03 VITALS — BP 132/66 | HR 79 | Ht 66.0 in | Wt 87.5 lb

## 2024-03-03 DIAGNOSIS — I480 Paroxysmal atrial fibrillation: Secondary | ICD-10-CM

## 2024-03-03 DIAGNOSIS — G43109 Migraine with aura, not intractable, without status migrainosus: Secondary | ICD-10-CM | POA: Diagnosis not present

## 2024-03-03 DIAGNOSIS — I6381 Other cerebral infarction due to occlusion or stenosis of small artery: Secondary | ICD-10-CM | POA: Diagnosis not present

## 2024-03-03 MED ORDER — UBRELVY 100 MG PO TABS: 100.0000 mg | ORAL_TABLET | ORAL | 11 refills | Status: AC | PRN

## 2024-03-03 NOTE — Patient Instructions (Addendum)
 Try Holland for acute migraine Take 1 tablet at onset of headache, may repeat in 2 hours if needed. Max is 200 mg in 24 hours.   If having more than 2-3 migraines a week, please let me know, consider adding Lamictal or CGRP injection

## 2024-03-03 NOTE — Progress Notes (Signed)
 [facility] [address] [place]. [place] [address] [phone]       OFFICE FOLLOW-UP NOTE  Ms. ALYSSE RATHE Date of Birth:  July 05, 1945 Medical Record Number:  [id number]   08/21/23 Dr. Rosemarie HPI: Ms. Creech is a 78 year old Caucasian lady seen today for initial office follow-up visit following hospital consultation for stroke November 2024.  She has past medical history of gastroesophageal reflux disease, colitis, arthritis, hypertension, hyperlipidemia, irritable bowel syndrome, osteoporosis and atrial fibrillation and COPD.  Patient presented on 03/21/2023 with sudden onset of numbness involving left arm and face and decreased sensation in the left leg.  NIH stroke scale on admission was only 1.  Scan confirmed a tiny punctate right thalamic infarct.  Patient had history of chronic A-fib.  She is started on Eliquis .  LDL cholesterol 130 mg percent.  Hemoglobin A1c was 5.6.  Echocardiogram on 11/16/2022 had shown EF 55 to 60% with grade 1 diastolic dysfunction.  CT angiogram had shown no large vessel occlusion.  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  Patient was started on Crestor  which she had previously had trouble tolerating.  She states her numbness is improving though not completely gone.  She still has some intermittent numbness around the left eye and the left foot.  She was not able to tolerate Crestor  again due to severe myalgias and stopped it 4 months ago.  She is tolerating Eliquis  well with only minor bruising and no bleeding episodes.  She has had no recurrent stroke or TIA symptoms.  She is quite active and eats healthy and in fact has trouble gaining weight and looks malnourished.  Update 03/03/24 SS: Reviewed ER note 02/27/2024 for headache.  Typically starts at right temple behind her right eye, pulsing.  Her PCP was concerned about temporal arteritis and started her on prednisone  11/2. 11/6 normal sed rate.  ER felt  migraine with aura. ER did not feel she had temporal arteritis.  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  Here today, since January 28, 2024, got violently ill, had headache bilateral retro orbital, vomiting, nauseated for 3 days, her temples hurt to touch, had aura of bright lights scrolling. Then got better after 3 days. Afterwards had headache daily, Tylenol  daily. Continues with the aura, often times aura alone. She saw her eye doctor, every thing was fine, thought migraine.   Since the ER visit, 1 migraine, went to her PCP, on Monday, given Toradol. Migraine behind both eyes, with aura of bright lights, nausea. Remains on Eliquis . Sample of [place] she has not tried.   ROS:   14 system review of systems is positive for numbness, tingling, bruising, myalgias all other systems negative  PMH:  Past Medical History:  Diagnosis Date   Arthritis    Colitis 2003   per path report from 2003: non-specific.  normal colonoscopy in 04/2011.    GERD (gastroesophageal reflux disease)    Hypertension    Irritable bowel syndrome    Osteoporosis    Palpitations    Pneumonia    hx   Rhinitis     Social History:  Social History   Socioeconomic History   Marital status: Married    Spouse name: Not on file   Number of children: 2  Years of education: Not on file   Highest education level: Not on file  Occupational History   Occupation: Retired  Tobacco Use   Smoking status: Former    Current packs/day: 0.00    Average packs/day: 0.3 packs/day for 50.0 years (12.5 ttl pk-yrs)    Types: Cigarettes    Start date: 58    Quit date: 2020    Years since quitting: 5.8   Smokeless tobacco: Never   Tobacco comments:    Patient smokes 5 cigarettes a day  Vaping Use   Vaping status: Never Used  Substance and  Sexual Activity   Alcohol  use: No    Alcohol /week: 0.0 standard drinks of alcohol    Drug use: No   Sexual activity: Not on file  Other Topics Concern   Not on file  Social History Narrative   Married to [place]-   2 children   Retired   Former smoker no alcohol  tobacco or drug use currently   Social Drivers of Corporate Investment Banker Strain: Not on file  Food Insecurity: No Food Insecurity (03/21/2023)   Hunger Vital Sign    Worried About Running Out of Food in the Last Year: Never true    Ran Out of Food in the Last Year: Never true  Transportation Needs: No Transportation Needs (03/21/2023)   PRAPARE - Administrator, Civil Service (Medical): No    Lack of Transportation (Non-Medical): No  Physical Activity: Not on file  Stress: Not on file  Social Connections: Unknown (09/03/2021)   [facility] from [facility]   Social Network    Social Network: Not on file  Intimate Partner Violence: Not At Risk (03/21/2023)   Humiliation, Afraid, Rape, and Kick questionnaire    Fear of Current or Ex-Partner: No    Emotionally Abused: No    Physically Abused: No    Sexually Abused: No    Medications:   Current Outpatient Medications on File Prior to Visit  Medication Sig Dispense Refill   acetaminophen  (TYLENOL ) 500 MG tablet Take 1,000 mg by mouth once as needed for moderate pain (pain score 4-6).     apixaban  (ELIQUIS ) 5 MG TABS tablet Take 1 tablet (5 mg total) by mouth 2 (two) times daily. 180 tablet 1   Ascorbic Acid  (VITAMIN C ) 1000 MG tablet Take 500 mg by mouth daily.     Coenzyme Q10 (CO Q 10) 100 MG CAPS Take 100 mg by mouth daily in the afternoon.     ELDERBERRY PO Take 1,000 mg by mouth daily in the afternoon.     Fish Oil-Cholecalciferol (OMEGA-3 FISH OIL-VITAMIN D3) 1200-1000 MG-UNIT CAPS Take 3 capsules by mouth daily.     isosorbide  mononitrate (IMDUR ) 30 MG 24 hr tablet Take 1 tablet (30 mg total) by mouth daily. 90 tablet 3   Magnesium  400 MG CAPS  Take 400 mg by mouth daily.     metoprolol  succinate (TOPROL -XL) 25 MG 24 hr tablet Take 1 tablet (25 mg total) by mouth daily. 90 tablet 3   Probiotic Product (PROBIOTIC DAILY) CAPS Take 1 capsule by mouth daily.     Propylene Glycol (SYSTANE COMPLETE) 0.6 % SOLN Place 1 drop into both eyes daily as needed (Dry eye).     sodium chloride  (OCEAN) 0.65 % SOLN nasal spray Place 1 spray into both nostrils daily as needed for congestion.     Ubrogepant (UBRELVY) 100 MG TABS Take 1 tablet by mouth daily as needed (not to exceed  2 tabs in 24 hour).     No current facility-administered medications on file prior to visit.    Allergies:   Allergies  Allergen Reactions   Hydrocodone Nausea And Vomiting   Amlodipine     Severe all over joint pain  Other Reaction(s): Unknown   Azithromycin Nausea Only    Other Reaction(s): Unknown   Excedrin Extra Strength [Aspirin -Acetaminophen -Caffeine] Nausea Only    Extreme nausea   Niacin Nausea And Vomiting    Other Reaction(s): Unknown   Niacin And Related Nausea And Vomiting   Sulfa Antibiotics Nausea And Vomiting and Other (See Comments)    Violently ill   Sulfasalazine Nausea And Vomiting    Violently ill  Other Reaction(s): Unknown   Latex Rash and Dermatitis    Other Reaction(s): Unknown    Physical Exam    03/03/2024   10:32 AM 02/27/2024    7:15 PM 02/27/2024    3:30 PM  Vitals with BMI  Height 5' 6    Weight 87 lbs 8 oz    BMI 14.13    Systolic 132 150 831  Diastolic 66 91 79  Pulse 79 76 59    Physical Exam  General: The patient is alert and cooperative at the time of the examination. Thin elderly female.   Skin: No significant peripheral edema is noted.  Neurologic Exam  Mental status: The patient is alert and oriented x 3 at the time of the examination. The patient has apparent normal recent and remote memory, with an apparently normal attention span and concentration ability.  Cranial nerves: Facial symmetry is present.  Speech is normal, no aphasia or dysarthria is noted. Extraocular movements are full. Visual fields are full.  Motor: The patient has good strength in all 4 extremities.  Sensory examination: Soft touch sensation is symmetric on the face, arms, and legs.  Coordination: The patient has good finger-nose-finger and heel-to-shin bilaterally.  Gait and station: The patient has a normal gait.  Reflexes: Deep tendon reflexes are symmetric but decreased  ASSESSMENT: 78 year old Caucasian lady with right thalamic lacunar infarct in November 2024 with chronic atrial fibrillation.  She is doing well with only minor residual paresthesias.  Remains on Eliquis .  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  Went to the ER 02/27/2024, MRI of the brain was unremarkable.  -Try Ubrelvy 100 mg as needed for acute migraine, not a candidate for triptan due to vascular risk factors.  -She wishes to hold off on preventative for now, we discussed options may be Lamictal or CGRP injection.  Concerned Depakote would interact with Eliquis , worried about weight loss with Topamax, already on metoprolol .  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 - Does not seem consistent with temporal arteritis, prednisone  was stopped, normal ESR, CRP. - Remains on Eliquis  for A-fib. Strict management of vascular risk factors with a goal BP less than 130/90, A1c less than 7.0, LDL less than 70 for secondary stroke prevention - Follow up with Dr. Rosemarie at his next available  I personally spent a total of 45 minutes in the care of the patient today including preparing to see the patient, getting/reviewing separately obtained history, performing a medically appropriate exam/evaluation, counseling and educating, placing orders, documenting clinical information in the EHR, communicating results, and coordinating care.  Lauraine Gayland MANDES, DNP  [facility] [address] [place], [place] [address] [phone]

## 2024-03-11 ENCOUNTER — Telehealth: Payer: Self-pay

## 2024-03-11 ENCOUNTER — Other Ambulatory Visit (HOSPITAL_COMMUNITY): Payer: Self-pay

## 2024-03-11 NOTE — Telephone Encounter (Signed)
 Malia from [facility] called to inform the office that the medication has been approved for the pt from 03/11/24-03/11/25. Information will be faxed over as well.

## 2024-03-11 NOTE — Telephone Encounter (Signed)
 Pharmacy Patient Advocate Encounter   Received notification from Fax that prior authorization for Ubrelvy 100mg  Tablet is required/requested.   Insurance verification completed.   The patient is insured through [facility].   Per test claim: PA required; PA submitted to above mentioned insurance via Latent Key/confirmation #/EOC [id number] Status is pending

## 2024-03-12 ENCOUNTER — Other Ambulatory Visit (HOSPITAL_COMMUNITY): Payer: Self-pay

## 2024-03-12 NOTE — Telephone Encounter (Signed)
 Pharmacy Patient Advocate Encounter  Received notification from [facility] that Prior Authorization for Ubrelvy  has been APPROVED from 03/11/2024 to 03/11/2025. Ran test claim, Copay is $0. This test claim was processed through [facility]- copay amounts may vary at other pharmacies due to pharmacy/plan contracts, or as the patient moves through the different stages of their insurance plan.   PA #/Case ID/Reference #: [id number]

## 2024-03-15 NOTE — Progress Notes (Signed)
 I agree with the above plan

## 2024-04-07 ENCOUNTER — Other Ambulatory Visit (HOSPITAL_BASED_OUTPATIENT_CLINIC_OR_DEPARTMENT_OTHER): Payer: Self-pay | Admitting: *Deleted

## 2024-04-07 DIAGNOSIS — Z122 Encounter for screening for malignant neoplasm of respiratory organs: Secondary | ICD-10-CM

## 2024-04-11 ENCOUNTER — Ambulatory Visit (HOSPITAL_BASED_OUTPATIENT_CLINIC_OR_DEPARTMENT_OTHER)

## 2024-04-21 ENCOUNTER — Ambulatory Visit (HOSPITAL_BASED_OUTPATIENT_CLINIC_OR_DEPARTMENT_OTHER)

## 2024-04-29 ENCOUNTER — Other Ambulatory Visit: Payer: Self-pay

## 2024-04-29 MED ORDER — METOPROLOL SUCCINATE ER 25 MG PO TB24: 25.0000 mg | ORAL_TABLET | Freq: Every day | ORAL | 0 refills | Status: AC

## 2024-04-30 ENCOUNTER — Telehealth: Payer: Self-pay | Admitting: Cardiovascular Disease

## 2024-04-30 NOTE — Telephone Encounter (Signed)
 Pt calling to get refills for all three medications with new insurance. 1. Which medications need to be refilled? (please list name of each medication and dose if known)   apixaban  (ELIQUIS ) 5 MG TABS tablet    metoprolol  succinate (TOPROL -XL) 25 MG 24 hr tablet   isosorbide  mononitrate (IMDUR ) 30 MG 24 hr tablet .    2. Would you like to learn more about the convenience, safety, & potential cost savings by using the [facility]? no   3. Are you open to using the [facility] (Type [facility]. no   4. Which pharmacy/location (including street and city if local pharmacy) is medication to be sent to?  [facility] - [place], [place] - [address]    5. Do they need a 30 day or 90 day supply?  90 days

## 2024-05-28 ENCOUNTER — Other Ambulatory Visit: Payer: Self-pay | Admitting: Physician Assistant

## 2024-05-28 NOTE — Telephone Encounter (Signed)
 Spoke with pt. Received a refill request for Metoprolol  Succinate 25 mg was denied. Pt has a 1 month supply at home and will get refills at her appointment on 06/01/24.

## 2024-06-01 ENCOUNTER — Ambulatory Visit: Admitting: Physician Assistant

## 2025-01-17 ENCOUNTER — Ambulatory Visit: Admitting: Neurology
